# Patient Record
Sex: Male | Born: 1960
Health system: Southern US, Community
[De-identification: ages and names within clinical notes are randomized; demographics above are authoritative.]

## PROBLEM LIST (undated history)

## (undated) DIAGNOSIS — I10 Essential (primary) hypertension: Secondary | ICD-10-CM

## (undated) DIAGNOSIS — M1990 Unspecified osteoarthritis, unspecified site: Secondary | ICD-10-CM

## (undated) DIAGNOSIS — K219 Gastro-esophageal reflux disease without esophagitis: Secondary | ICD-10-CM

## (undated) DIAGNOSIS — E66811 Obesity, class 1: Secondary | ICD-10-CM

## (undated) DIAGNOSIS — R918 Other nonspecific abnormal finding of lung field: Secondary | ICD-10-CM

## (undated) DIAGNOSIS — E669 Obesity, unspecified: Secondary | ICD-10-CM

## (undated) HISTORY — PX: COLONOSCOPY: SHX174

## (undated) HISTORY — DX: Essential (primary) hypertension: I10

## (undated) HISTORY — DX: Obesity, unspecified: E66.9

## (undated) HISTORY — DX: Unspecified osteoarthritis, unspecified site: M19.90

## (undated) HISTORY — PX: OTHER SURGICAL HISTORY: SHX169

## (undated) HISTORY — DX: Other nonspecific abnormal finding of lung field: R91.8

## (undated) HISTORY — DX: Obesity, class 1: E66.811

## (undated) HISTORY — DX: Gastro-esophageal reflux disease without esophagitis: K21.9

---

## 2000-07-09 ENCOUNTER — Encounter: Payer: Self-pay | Admitting: Gastroenterology

## 2000-07-09 ENCOUNTER — Ambulatory Visit (HOSPITAL_COMMUNITY): Admission: RE | Admit: 2000-07-09 | Discharge: 2000-07-09 | Payer: Self-pay | Admitting: Gastroenterology

## 2014-12-06 DIAGNOSIS — K219 Gastro-esophageal reflux disease without esophagitis: Secondary | ICD-10-CM | POA: Insufficient documentation

## 2014-12-06 DIAGNOSIS — R7303 Prediabetes: Secondary | ICD-10-CM | POA: Insufficient documentation

## 2014-12-06 DIAGNOSIS — I10 Essential (primary) hypertension: Secondary | ICD-10-CM | POA: Insufficient documentation

## 2014-12-06 DIAGNOSIS — M1990 Unspecified osteoarthritis, unspecified site: Secondary | ICD-10-CM | POA: Insufficient documentation

## 2014-12-06 DIAGNOSIS — E785 Hyperlipidemia, unspecified: Secondary | ICD-10-CM | POA: Insufficient documentation

## 2014-12-27 DIAGNOSIS — M654 Radial styloid tenosynovitis [de Quervain]: Secondary | ICD-10-CM | POA: Insufficient documentation

## 2016-04-05 DIAGNOSIS — Z86010 Personal history of colonic polyps: Secondary | ICD-10-CM | POA: Insufficient documentation

## 2018-10-20 LAB — BASIC METABOLIC PANEL
BUN: 13 (ref 4–21)
Creatinine: 1 (ref 0.6–1.3)
Glucose: 99
Sodium: 140 (ref 137–147)

## 2019-09-29 ENCOUNTER — Telehealth: Payer: Self-pay | Admitting: Family Medicine

## 2019-09-29 NOTE — Telephone Encounter (Signed)
error 

## 2019-09-30 ENCOUNTER — Encounter: Payer: Self-pay | Admitting: Family Medicine

## 2019-09-30 ENCOUNTER — Ambulatory Visit (INDEPENDENT_AMBULATORY_CARE_PROVIDER_SITE_OTHER): Payer: BC Managed Care – PPO | Admitting: Family Medicine

## 2019-09-30 ENCOUNTER — Other Ambulatory Visit: Payer: Self-pay

## 2019-09-30 VITALS — BP 126/80 | HR 70 | Temp 98.6°F | Resp 16 | Ht 67.75 in | Wt 200.2 lb

## 2019-09-30 DIAGNOSIS — I10 Essential (primary) hypertension: Secondary | ICD-10-CM | POA: Diagnosis not present

## 2019-09-30 DIAGNOSIS — Z0000 Encounter for general adult medical examination without abnormal findings: Secondary | ICD-10-CM

## 2019-09-30 NOTE — Progress Notes (Signed)
Office Note 09/30/2019  CC:  Chief Complaint  Patient presents with  . Establish Care    Previous PCP, Dr. Caron Presume and seen at [facility] recently    HPI:  Todd Bailey is a 58 y.o. White male who is here to establish care and get CPE.  He is not fasting. Patient's most recent primary MD: see above Old records were not available for review prior to or during today's visit.  Building a home in [place]. Job super-stressful now, hoping to retire at age 3.  No formal exercise.   No home bp checking at all. Controlled at past MD visits while on losartan.  No acute complaints. Last CPE was approx 1.5-2 yrs ago.  Past Medical History:  Diagnosis Date  . Arthritis   . GERD (gastroesophageal reflux disease)   . Hypertension   . Obesity, Class I, BMI 30-34.9     Past Surgical History:  Procedure Laterality Date  . COLONOSCOPY  approx 2018   ? polyp, unclear on recall time--get records.  . ESOPHAGEAL DILATION  2001   Dr. Madilyn Fireman.  Had it done twice (GERD-induced stricture)   Review of Systems  Constitutional: Negative for appetite change, chills, fatigue and fever.  HENT: Negative for congestion, dental problem, ear pain and sore throat.   Eyes: Negative for discharge, redness and visual disturbance.  Respiratory: Negative for cough, chest tightness, shortness of breath and wheezing.   Cardiovascular: Negative for chest pain, palpitations and leg swelling.  Gastrointestinal: Negative for abdominal pain, blood in stool, diarrhea, nausea and vomiting.  Genitourinary: Negative for difficulty urinating, dysuria, flank pain, frequency, hematuria and urgency.  Musculoskeletal: Negative for arthralgias, back pain, joint swelling, myalgias and neck stiffness.  Skin: Negative for pallor and rash.  Neurological: Negative for dizziness, speech difficulty, weakness and headaches.  Hematological: Negative for adenopathy. Does not bruise/bleed easily.   Psychiatric/Behavioral: Negative for confusion and sleep disturbance. The patient is not nervous/anxious.     Family History  Problem Relation Age of Onset  . Arthritis Mother   . Hearing loss Mother   . Heart disease Mother   . Arthritis Father   . Heart disease Father   . High Cholesterol Father   . High blood pressure Father     Social History   Socioeconomic History  . Marital status: Married    Spouse name: Not on file  . Number of children: Not on file  . Years of education: Not on file  . Highest education level: Not on file  Occupational History  . Not on file  Social Needs  . Financial resource strain: Not on file  . Food insecurity    Worry: Not on file    Inability: Not on file  . Transportation needs    Medical: Not on file    Non-medical: Not on file  Tobacco Use  . Smoking status: Never Smoker  . Smokeless tobacco: Never Used  Substance and Sexual Activity  . Alcohol use: Never    Frequency: Never  . Drug use: Never  . Sexual activity: Not on file  Lifestyle  . Physical activity    Days per week: Not on file    Minutes per session: Not on file  . Stress: Not on file  Relationships  . Social Musician on phone: Not on file    Gets together: Not on file    Attends religious service: Not on file    Active member of club  or organization: Not on file    Attends meetings of clubs or organizations: Not on file    Relationship status: Not on file  . Intimate partner violence    Fear of current or ex partner: Not on file    Emotionally abused: Not on file    Physically abused: Not on file    Forced sexual activity: Not on file  Other Topics Concern  . Not on file  Social History Narrative   Married, several children (grown).   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   Tob: 25 pack-yr hx; quit 1997   Alc: rare beer   He leads teams on mission trips.       Outpatient Encounter Medications as of 09/30/2019  Medication Sig  . losartan (COZAAR) 50 MG tablet Take by mouth.  Marland Kitchen omeprazole (PRILOSEC) 20 MG capsule Take by mouth.   No facility-administered encounter medications on file as of 09/30/2019.     No Known Allergies  PE; Blood pressure 126/80, pulse 70, temperature 98.6 F (37 C), temperature source Temporal, resp. rate 16, height 5' 7.75" (1.721 m), weight 200 lb 3.2 oz (90.8 kg), SpO2 98 %. Body mass index is 30.67 kg/m.  Gen: Alert, well appearing.  Patient is oriented to person, place, time, and situation. AFFECT: pleasant, lucid thought and speech. ENT: Ears: EACs clear, normal epithelium.  TMs with good light reflex and landmarks bilaterally.  Eyes: no injection, icteris, swelling, or exudate.  EOMI, PERRLA. Nose: no drainage or turbinate edema/swelling.  No injection or focal lesion.  Mouth: lips without lesion/swelling.  Oral mucosa pink and moist.  Dentition intact and without obvious caries or gingival swelling.  Oropharynx without erythema, exudate, or swelling.  Neck: supple/nontender.  No LAD, mass, or TM.  Carotid pulses 2+ bilaterally, without bruits. CV: RRR, no m/r/g.   LUNGS: CTA bilat, nonlabored resps, good aeration in all lung fields. ABD: soft, NT, ND, BS normal.  No hepatospenomegaly or mass.  No bruits. EXT: no clubbing, cyanosis, or edema.  Musculoskeletal: no joint swelling, erythema, warmth, or tenderness.  ROM of all joints intact. Skin - no sores or suspicious lesions or rashes or color changes Rectal exam: negative without mass, lesions or tenderness, PROSTATE EXAM: smooth and symmetric without nodules or tenderness.   Pertinent labs:  None today  ASSESSMENT AND PLAN:   New pt; will get old records from PCP and GI if possible.  Health maintenance exam: Reviewed age and gender appropriate health maintenance issues (prudent diet, regular exercise, health risks of tobacco and excessive alcohol, use  of seatbelts, fire alarms in home, use of sunscreen).  Also reviewed age and gender appropriate health screening as well as vaccine recommendations. Vaccines: he declined flu, tetanus, and shingrix vaccines today. Labs: return for fasting HP + PSA. Prostate ca screening: DRE normal today, PSA-future. Colon ca screening: colonoscopy 2018 but need records for complete details.  An After Visit Summary was printed and given to the patient.  Return in about 1 year (around 09/29/2020) for annual CPE (fasting).  Signed:  Crissie Sickles, MD           09/30/2019

## 2019-09-30 NOTE — Patient Instructions (Signed)

## 2019-10-06 ENCOUNTER — Ambulatory Visit: Payer: Self-pay

## 2019-10-08 ENCOUNTER — Ambulatory Visit (INDEPENDENT_AMBULATORY_CARE_PROVIDER_SITE_OTHER): Payer: BC Managed Care – PPO

## 2019-10-08 ENCOUNTER — Other Ambulatory Visit: Payer: Self-pay

## 2019-10-08 DIAGNOSIS — I10 Essential (primary) hypertension: Secondary | ICD-10-CM

## 2019-10-08 LAB — TSH: TSH: 1.48 u[IU]/mL (ref 0.35–4.50)

## 2019-10-08 LAB — CBC WITH DIFFERENTIAL/PLATELET
Basophils Absolute: 0 10*3/uL (ref 0.0–0.1)
Basophils Relative: 0.5 % (ref 0.0–3.0)
Eosinophils Absolute: 0.2 10*3/uL (ref 0.0–0.7)
Eosinophils Relative: 3.4 % (ref 0.0–5.0)
HCT: 45.3 % (ref 39.0–52.0)
Hemoglobin: 15.5 g/dL (ref 13.0–17.0)
Lymphocytes Relative: 32.5 % (ref 12.0–46.0)
Lymphs Abs: 1.7 10*3/uL (ref 0.7–4.0)
MCHC: 34.3 g/dL (ref 30.0–36.0)
MCV: 88.6 fl (ref 78.0–100.0)
Monocytes Absolute: 0.3 10*3/uL (ref 0.1–1.0)
Monocytes Relative: 6.2 % (ref 3.0–12.0)
Neutro Abs: 3 10*3/uL (ref 1.4–7.7)
Neutrophils Relative %: 57.4 % (ref 43.0–77.0)
Platelets: 200 10*3/uL (ref 150.0–400.0)
RBC: 5.11 Mil/uL (ref 4.22–5.81)
RDW: 12.5 % (ref 11.5–15.5)
WBC: 5.2 10*3/uL (ref 4.0–10.5)

## 2019-10-08 LAB — COMPREHENSIVE METABOLIC PANEL
ALT: 20 U/L (ref 0–53)
AST: 15 U/L (ref 0–37)
Albumin: 4.2 g/dL (ref 3.5–5.2)
Alkaline Phosphatase: 103 U/L (ref 39–117)
BUN: 11 mg/dL (ref 6–23)
CO2: 29 mEq/L (ref 19–32)
Calcium: 9.5 mg/dL (ref 8.4–10.5)
Chloride: 104 mEq/L (ref 96–112)
Creatinine, Ser: 0.9 mg/dL (ref 0.40–1.50)
GFR: 86.09 mL/min (ref >60.00)
Glucose, Bld: 98 mg/dL (ref 70–99)
Potassium: 4.3 mEq/L (ref 3.5–5.1)
Sodium: 138 mEq/L (ref 135–145)
Total Bilirubin: 0.7 mg/dL (ref 0.2–1.2)
Total Protein: 6.8 g/dL (ref 6.0–8.3)

## 2019-10-08 LAB — LIPID PANEL
Cholesterol: 156 mg/dL (ref 0–200)
HDL: 32.8 mg/dL — ABNORMAL LOW (ref >39.00)
LDL Cholesterol: 103 mg/dL — ABNORMAL HIGH (ref 0–99)
NonHDL: 123.44
Total CHOL/HDL Ratio: 5
Triglycerides: 104 mg/dL (ref 0.0–149.0)
VLDL: 20.8 mg/dL (ref 0.0–40.0)

## 2019-10-09 ENCOUNTER — Other Ambulatory Visit: Payer: Self-pay

## 2019-10-09 MED ORDER — LOSARTAN POTASSIUM 50 MG PO TABS: 50.0000 mg | ORAL_TABLET | Freq: Every day | ORAL | 3 refills | Status: DC

## 2019-10-09 MED ORDER — OMEPRAZOLE 20 MG PO CPDR: 20.0000 mg | DELAYED_RELEASE_CAPSULE | Freq: Every day | ORAL | 3 refills | Status: DC

## 2019-10-09 NOTE — Telephone Encounter (Signed)
Pt recently established, labs were done 10/08/19 and all were normal. Patient is requesting refills for 2 meds not originally prescribed by you.   Please advise, thanks. Medications pending

## 2019-10-16 ENCOUNTER — Encounter: Payer: Self-pay | Admitting: Family Medicine

## 2020-03-31 ENCOUNTER — Encounter: Payer: Self-pay | Admitting: Family Medicine

## 2020-03-31 ENCOUNTER — Other Ambulatory Visit: Payer: Self-pay

## 2020-03-31 ENCOUNTER — Ambulatory Visit (INDEPENDENT_AMBULATORY_CARE_PROVIDER_SITE_OTHER): Payer: BC Managed Care – PPO | Admitting: Family Medicine

## 2020-03-31 VITALS — BP 129/90 | HR 87 | Temp 98.3°F | Resp 16 | Ht 67.75 in | Wt 207.4 lb

## 2020-03-31 DIAGNOSIS — F439 Reaction to severe stress, unspecified: Secondary | ICD-10-CM | POA: Diagnosis not present

## 2020-03-31 DIAGNOSIS — R002 Palpitations: Secondary | ICD-10-CM | POA: Diagnosis not present

## 2020-03-31 NOTE — Progress Notes (Signed)
OFFICE VISIT  04/04/2020   CC:  Chief Complaint  Patient presents with  . Episode of decreased cognitive function    feeling stressed for the past 3 weeks and in the last month, increased blood pressure. Flutters off and on   HPI:    Patient is a 59 y.o. Caucasian male who presents for "episode of decreased cognitive function 03/23/20, increased stress lately". Lots of increased work stress the last few months. Describes an abnormal period one day recently -->woke up, felt normal, took shower, made coffee, was looking for holster for his work phone, was not thinking as clearly as normal.  His wife was concerned.  He did not sleep well the night before.  Lots of sleep dysfunction lately due to work stress, waking up thinking about workload, hard to get back to sleep.   Chronically anxious, particularly about workload.  He has had a long hx of intermittent feeling of his heart fluttering up to the timehe got dx'd with HTN about 15 yrs ago.  Got on losartan and says bp controlled and fluttering stopped.    Says the fluttering episodes last minutes at a time, don't feel like a racing heart.  No SOB, dizziness, CP, diaphoresis, or cognitive dysfunction with the episodes.  They seem to come and go randomly and he just keeps on doing whatever he's doing.  Fluttering occurring daily now for the last 2 wks or so. Denies depression.  Normal behavior with coworkers and family and friends. No worries other than work situation.  Drinks 2 cups coffee each morning.  No other caffeine.  No changes in caffeine intake. On otc decongestants.  No herbals.  He does snore.  No witnessed apnea.  Sleep has usually been restorative. Works hard/well all day with good focus.  ROS: no fevers,  no wheezing, no cough,  no HAs, no rashes, no melena/hematochezia.  No polyuria or polydipsia.  No myalgias or arthralgias.  No focal weakness, paresthesias, or tremors.  No acute vision or hearing abnormalities.   No n/v/d or  abd pain.     Past Medical History:  Diagnosis Date  . Arthritis   . GERD (gastroesophageal reflux disease)   . Hypertension   . Obesity, Class I, BMI 30-34.9     Past Surgical History:  Procedure Laterality Date  . COLONOSCOPY  approx 2018   ? polyp, unclear on recall time--get records.  . ESOPHAGEAL DILATION  2001   Dr. Amedeo Plenty.  Had it done twice (GERD-induced stricture)    Outpatient Medications Prior to Visit  Medication Sig Dispense Refill  . losartan (COZAAR) 50 MG tablet Take 1 tablet (50 mg total) by mouth daily. 90 tablet 3  . omeprazole (PRILOSEC) 20 MG capsule Take 1 capsule (20 mg total) by mouth daily. 90 capsule 3   No facility-administered medications prior to visit.    No Known Allergies  ROS As per HPI  PE: Blood pressure 129/90, pulse 87, temperature 98.3 F (36.8 C), temperature source Temporal, resp. rate 16, height 5' 7.75" (1.721 m), weight 207 lb 6.4 oz (94.1 kg), SpO2 91 %. Body mass index is 31.77 kg/m.  Gen: Alert, well appearing.  Patient is oriented to person, place, time, and situation. AFFECT: pleasant, lucid thought and speech. ENT:  Eyes: no injection, icteris, swelling, or exudate.  EOMI, PERRLA. Nose: no drainage or turbinate edema/swelling.  No injection or focal lesion.  Mouth: lips without lesion/swelling.  Oral mucosa pink and moist.  Dentition intact and without obvious  caries or gingival swelling.  Oropharynx without erythema, exudate, or swelling.  Neck: supple/nontender.  No LAD, mass, or TM.  CV: RRR, no m/r/g.   LUNGS: CTA bilat, nonlabored resps, good aeration in all lung fields. ABD: soft, NT, ND, BS normal.  No hepatospenomegaly or mass.  No bruits. EXT: no clubbing, cyanosis, or edema.  Musculoskeletal: no joint swelling, erythema, warmth, or tenderness.  ROM of all joints intact. Skin - no sores or suspicious lesions or rashes or color changes  LABS:  Lab Results  Component Value Date   TSH 2.72 03/31/2020   Lab  Results  Component Value Date   WBC 6.4 03/31/2020   HGB 16.2 03/31/2020   HCT 47.7 03/31/2020   MCV 87.5 03/31/2020   PLT 246 03/31/2020   Lab Results  Component Value Date   CREATININE 0.91 03/31/2020   BUN 12 03/31/2020   NA 137 03/31/2020   K 4.4 03/31/2020   CL 104 03/31/2020   CO2 24 03/31/2020   Lab Results  Component Value Date   ALT 20 10/08/2019   AST 15 10/08/2019   ALKPHOS 103 10/08/2019   BILITOT 0.7 10/08/2019   Lab Results  Component Value Date   CHOL 156 10/08/2019   Lab Results  Component Value Date   HDL 32.80 (L) 10/08/2019   Lab Results  Component Value Date   LDLCALC 103 (H) 10/08/2019   Lab Results  Component Value Date   TRIG 104.0 10/08/2019   Lab Results  Component Value Date   CHOLHDL 5 10/08/2019   12 lead ekg today: NSR, rate 69, no ectopy.  No ST/T wave changes except isolated TWI in V1.  Low voltage aVF and V2.  intervals and duration normal. Essentially NORMAL EKG. No prior ekg for comparison.  IMPRESSION AND PLAN:  1) Palpitations: unknown etiology. EKG today reassuring. CBC, BMET, TSH, Mag today. Ordered 48 H holter monitor.  2) Situational stress: discussed possible med tx (SSRI if persistent/worsening, prn benzo possibly) but he is not wanting to pursue any med use at this time, which I wholeheartedly agree with.  An After Visit Summary was printed and given to the patient.  FOLLOW UP: Return if symptoms worsen or fail to improve.  Signed:  Santiago Bumpers, MD           04/04/2020

## 2020-04-01 ENCOUNTER — Telehealth: Payer: Self-pay | Admitting: Radiology

## 2020-04-01 LAB — BASIC METABOLIC PANEL
BUN: 12 mg/dL (ref 7–25)
CO2: 24 mmol/L (ref 20–32)
Calcium: 9.5 mg/dL (ref 8.6–10.3)
Chloride: 104 mmol/L (ref 98–110)
Creat: 0.91 mg/dL (ref 0.70–1.25)
Glucose, Bld: 92 mg/dL (ref 65–99)
Potassium: 4.4 mmol/L (ref 3.5–5.3)
Sodium: 137 mmol/L (ref 135–146)

## 2020-04-01 LAB — CBC
HCT: 47.7 % (ref 38.5–50.0)
Hemoglobin: 16.2 g/dL (ref 13.2–17.1)
MCH: 29.7 pg (ref 27.0–33.0)
MCHC: 34 g/dL (ref 32.0–36.0)
MCV: 87.5 fL (ref 80.0–100.0)
MPV: 10.5 fL (ref 7.5–12.5)
Platelets: 246 10*3/uL (ref 140–400)
RBC: 5.45 10*6/uL (ref 4.20–5.80)
RDW: 12.8 % (ref 11.0–15.0)
WBC: 6.4 10*3/uL (ref 3.8–10.8)

## 2020-04-01 LAB — MAGNESIUM: Magnesium: 2 mg/dL (ref 1.5–2.5)

## 2020-04-01 LAB — TSH: TSH: 2.72 mIU/L (ref 0.40–4.50)

## 2020-04-01 NOTE — Telephone Encounter (Signed)
Enrolled patient for a 3 day Zio monitor to be mailed to patients home. Brief instructions were gone over with the patient and he knows to expect the monitor to arrive in 3-4 days. Patient was informed per his ordering MD he only needs to wear for 2 days.   48hr holter was switch to a 3 day Zio for mailing purposes during Covid

## 2020-04-06 ENCOUNTER — Other Ambulatory Visit (INDEPENDENT_AMBULATORY_CARE_PROVIDER_SITE_OTHER): Payer: BC Managed Care – PPO

## 2020-04-06 DIAGNOSIS — R002 Palpitations: Secondary | ICD-10-CM | POA: Diagnosis not present

## 2020-04-30 DIAGNOSIS — R002 Palpitations: Secondary | ICD-10-CM

## 2020-04-30 HISTORY — DX: Palpitations: R00.2

## 2020-04-30 HISTORY — PX: OTHER SURGICAL HISTORY: SHX169

## 2020-05-03 ENCOUNTER — Encounter: Payer: Self-pay | Admitting: Family Medicine

## 2020-08-27 DIAGNOSIS — Z20822 Contact with and (suspected) exposure to covid-19: Secondary | ICD-10-CM | POA: Diagnosis not present

## 2020-08-31 DIAGNOSIS — E7800 Pure hypercholesterolemia, unspecified: Secondary | ICD-10-CM

## 2020-08-31 HISTORY — DX: Pure hypercholesterolemia, unspecified: E78.00

## 2020-09-13 DIAGNOSIS — Z20822 Contact with and (suspected) exposure to covid-19: Secondary | ICD-10-CM | POA: Diagnosis not present

## 2020-09-13 DIAGNOSIS — R5383 Other fatigue: Secondary | ICD-10-CM | POA: Diagnosis not present

## 2020-09-13 DIAGNOSIS — R519 Headache, unspecified: Secondary | ICD-10-CM | POA: Diagnosis not present

## 2020-09-30 ENCOUNTER — Encounter: Payer: Self-pay | Admitting: Family Medicine

## 2020-09-30 ENCOUNTER — Ambulatory Visit (INDEPENDENT_AMBULATORY_CARE_PROVIDER_SITE_OTHER): Payer: BC Managed Care – PPO | Admitting: Family Medicine

## 2020-09-30 ENCOUNTER — Other Ambulatory Visit: Payer: Self-pay

## 2020-09-30 VITALS — BP 155/95 | HR 70 | Temp 98.6°F | Resp 18 | Ht 68.0 in | Wt 202.2 lb

## 2020-09-30 DIAGNOSIS — Z125 Encounter for screening for malignant neoplasm of prostate: Secondary | ICD-10-CM

## 2020-09-30 DIAGNOSIS — I10 Essential (primary) hypertension: Secondary | ICD-10-CM | POA: Diagnosis not present

## 2020-09-30 DIAGNOSIS — Z0000 Encounter for general adult medical examination without abnormal findings: Secondary | ICD-10-CM | POA: Diagnosis not present

## 2020-09-30 LAB — COMPREHENSIVE METABOLIC PANEL
ALT: 25 U/L (ref 0–53)
AST: 18 U/L (ref 0–37)
Albumin: 4.2 g/dL (ref 3.5–5.2)
Alkaline Phosphatase: 97 U/L (ref 39–117)
BUN: 16 mg/dL (ref 6–23)
CO2: 27 mEq/L (ref 19–32)
Calcium: 9.4 mg/dL (ref 8.4–10.5)
Chloride: 103 mEq/L (ref 96–112)
Creatinine, Ser: 0.89 mg/dL (ref 0.40–1.50)
GFR: 86.92 mL/min (ref >60.00)
Glucose, Bld: 97 mg/dL (ref 70–99)
Potassium: 4.2 mEq/L (ref 3.5–5.1)
Sodium: 137 mEq/L (ref 135–145)
Total Bilirubin: 0.5 mg/dL (ref 0.2–1.2)
Total Protein: 6.9 g/dL (ref 6.0–8.3)

## 2020-09-30 LAB — CBC WITH DIFFERENTIAL/PLATELET
Basophils Absolute: 0 10*3/uL (ref 0.0–0.1)
Basophils Relative: 0.8 % (ref 0.0–3.0)
Eosinophils Absolute: 0.2 10*3/uL (ref 0.0–0.7)
Eosinophils Relative: 2.6 % (ref 0.0–5.0)
HCT: 43.7 % (ref 39.0–52.0)
Hemoglobin: 14.9 g/dL (ref 13.0–17.0)
Lymphocytes Relative: 28.8 % (ref 12.0–46.0)
Lymphs Abs: 1.7 10*3/uL (ref 0.7–4.0)
MCHC: 34.1 g/dL (ref 30.0–36.0)
MCV: 87.6 fl (ref 78.0–100.0)
Monocytes Absolute: 0.4 10*3/uL (ref 0.1–1.0)
Monocytes Relative: 6.5 % (ref 3.0–12.0)
Neutro Abs: 3.5 10*3/uL (ref 1.4–7.7)
Neutrophils Relative %: 61.3 % (ref 43.0–77.0)
Platelets: 212 10*3/uL (ref 150.0–400.0)
RBC: 4.99 Mil/uL (ref 4.22–5.81)
RDW: 12.7 % (ref 11.5–15.5)
WBC: 5.8 10*3/uL (ref 4.0–10.5)

## 2020-09-30 LAB — LIPID PANEL
Cholesterol: 176 mg/dL (ref 0–200)
HDL: 34.6 mg/dL — ABNORMAL LOW (ref >39.00)
LDL Cholesterol: 129 mg/dL — ABNORMAL HIGH (ref 0–99)
NonHDL: 141.23
Total CHOL/HDL Ratio: 5
Triglycerides: 62 mg/dL (ref 0.0–149.0)
VLDL: 12.4 mg/dL (ref 0.0–40.0)

## 2020-09-30 LAB — PSA: PSA: 0.86 ng/mL (ref 0.10–4.00)

## 2020-09-30 LAB — TSH: TSH: 1.78 u[IU]/mL (ref 0.35–4.50)

## 2020-09-30 MED ORDER — OMEPRAZOLE 20 MG PO CPDR
20.0000 mg | DELAYED_RELEASE_CAPSULE | Freq: Every day | ORAL | 3 refills | Status: DC
Start: 2020-09-30 — End: 2021-09-25

## 2020-09-30 MED ORDER — LOSARTAN POTASSIUM 100 MG PO TABS: 100.0000 mg | ORAL_TABLET | Freq: Every day | ORAL | 3 refills | Status: DC

## 2020-09-30 NOTE — Patient Instructions (Addendum)
Check your blood pressure and heart rate at home every day for two weeks. Write the numbers down and call our office (or send note via MyChart) to report these to my nurse and I'll get back with you about any new recommendations.  Health Maintenance, Male Adopting a healthy lifestyle and getting preventive care are important in promoting health and wellness. Ask your health care provider about:  The right schedule for you to have regular tests and exams.  Things you can do on your own to prevent diseases and keep yourself healthy. What should I know about diet, weight, and exercise? Eat a healthy diet   Eat a diet that includes plenty of vegetables, fruits, low-fat dairy products, and lean protein.  Do not eat a lot of foods that are high in solid fats, added sugars, or sodium. Maintain a healthy weight Body mass index (BMI) is a measurement that can be used to identify possible weight problems. It estimates body fat based on height and weight. Your health care provider can help determine your BMI and help you achieve or maintain a healthy weight. Get regular exercise Get regular exercise. This is one of the most important things you can do for your health. Most adults should:  Exercise for at least 150 minutes each week. The exercise should increase your heart rate and make you sweat (moderate-intensity exercise).  Do strengthening exercises at least twice a week. This is in addition to the moderate-intensity exercise.  Spend less time sitting. Even light physical activity can be beneficial. Watch cholesterol and blood lipids Have your blood tested for lipids and cholesterol at 61 years of age, then have this test every 5 years. You may need to have your cholesterol levels checked more often if:  Your lipid or cholesterol levels are high.  You are older than 61 years of age.  You are at high risk for heart disease. What should I know about cancer screening? Many types of cancers  can be detected early and may often be prevented. Depending on your health history and family history, you may need to have cancer screening at various ages. This may include screening for:  Colorectal cancer.  Prostate cancer.  Skin cancer.  Lung cancer. What should I know about heart disease, diabetes, and high blood pressure? Blood pressure and heart disease  High blood pressure causes heart disease and increases the risk of stroke. This is more likely to develop in people who have high blood pressure readings, are of African descent, or are overweight.  Talk with your health care provider about your target blood pressure readings.  Have your blood pressure checked: ? Every 3-5 years if you are 79-11 years of age. ? Every year if you are 57 years old or older.  If you are between the ages of 30 and 95 and are a current or former smoker, ask your health care provider if you should have a one-time screening for abdominal aortic aneurysm (AAA). Diabetes Have regular diabetes screenings. This checks your fasting blood sugar level. Have the screening done:  Once every three years after age 65 if you are at a normal weight and have a low risk for diabetes.  More often and at a younger age if you are overweight or have a high risk for diabetes. What should I know about preventing infection? Hepatitis B If you have a higher risk for hepatitis B, you should be screened for this virus. Talk with your health care provider to find  out if you are at risk for hepatitis B infection. Hepatitis C Blood testing is recommended for:  Everyone born from [birth year] through 1967.  Anyone with known risk factors for hepatitis C. Sexually transmitted infections (STIs)  You should be screened each year for STIs, including gonorrhea and chlamydia, if: ? You are sexually active and are younger than 61 years of age. ? You are older than 61 years of age and your health care provider tells you that you are at  risk for this type of infection. ? Your sexual activity has changed since you were last screened, and you are at increased risk for chlamydia or gonorrhea. Ask your health care provider if you are at risk.  Ask your health care provider about whether you are at high risk for HIV. Your health care provider may recommend a prescription medicine to help prevent HIV infection. If you choose to take medicine to prevent HIV, you should first get tested for HIV. You should then be tested every 3 months for as long as you are taking the medicine. Follow these instructions at home: Lifestyle  Do not use any products that contain nicotine or tobacco, such as cigarettes, e-cigarettes, and chewing tobacco. If you need help quitting, ask your health care provider.  Do not use street drugs.  Do not share needles.  Ask your health care provider for help if you need support or information about quitting drugs. Alcohol use  Do not drink alcohol if your health care provider tells you not to drink.  If you drink alcohol: ? Limit how much you have to 0-2 drinks a day. ? Be aware of how much alcohol is in your drink. In the [place] one drink equals one 12 oz bottle of beer (355 mL), one 5 oz glass of wine (148 mL), or one 1 oz glass of hard liquor (44 mL). General instructions  Schedule regular health, dental, and eye exams.  Stay current with your vaccines.  Tell your health care provider if: ? You often feel depressed. ? You have ever been abused or do not feel safe at home. Summary  Adopting a healthy lifestyle and getting preventive care are important in promoting health and wellness.  Follow your health care provider's instructions about healthy diet, exercising, and getting tested or screened for diseases.  Follow your health care provider's instructions on monitoring your cholesterol and blood pressure. This information is not intended to replace advice given to you by your health care  provider. Make sure you discuss any questions you have with your health care provider. Document Revised: 12/10/2018 Document Reviewed: 12/10/2018 Elsevier Patient Education  2020 [employer].

## 2020-09-30 NOTE — Progress Notes (Signed)
Office Note 09/30/2020  CC:  Chief Complaint  Patient presents with  . Annual Exam    doing well, no complaint   HPI:  Todd Bailey is a 59 y.o. White male who is here for annual health maintenance exam and f/u HTN.  Moved in house in [place] 10/2020. Work less stressful. Just got back from [place] mission trip.  Feeling well, no complaints. Rare bp check 140s/80s.  Exercise: rides a bike qod Tries to eat lower sodium diet.   Past Medical History:  Diagnosis Date  . Arthritis   . GERD (gastroesophageal reflux disease)   . Hypertension   . Obesity, Class I, BMI 30-34.9   . Palpitation 04/2020   Zio patch x 3d -->occ PAC and PVC; triggered events correlate with times of PVCs.  Reassured.    Past Surgical History:  Procedure Laterality Date  . COLONOSCOPY  approx 2018   ? polyp, unclear on recall time--get records.  . ESOPHAGEAL DILATION  2001   Dr. Madilyn Fireman.  Had it done twice (GERD-induced stricture)  . Heart Rhythm monitoring  04/2020   Zio patch x 3d -->occ PAC and PVC; triggered events correlate with times of PVCs.  Reassured.    Family History  Problem Relation Age of Onset  . Arthritis Mother   . Hearing loss Mother   . Heart disease Mother   . Arthritis Father   . Heart disease Father   . High Cholesterol Father   . High blood pressure Father     Social History   Socioeconomic History  . Marital status: Married    Spouse name: Not on file  . Number of children: Not on file  . Years of education: Not on file  . Highest education level: Not on file  Occupational History  . Not on file  Tobacco Use  . Smoking status: Never Smoker  . Smokeless tobacco: Never Used  Substance and Sexual Activity  . Alcohol use: Never  . Drug use: Never  . Sexual activity: Not on file  Other Topics Concern  . Not on file  Social History Narrative   Married, several children (grown).   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   Tob: 25 pack-yr hx; quit 1997   Alc: rare beer   He leads teams on mission trips.     Social Determinants of Health   Financial Resource Strain:   . Difficulty of Paying Living Expenses: Not on file  Food Insecurity:   . Worried About Programme researcher, broadcasting/film/video in the Last Year: Not on file  . Ran Out of Food in the Last Year: Not on file  Transportation Needs:   . Lack of Transportation (Medical): Not on file  . Lack of Transportation (Non-Medical): Not on file  Physical Activity:   . Days of Exercise per Week: Not on file  . Minutes of Exercise per Session: Not on file  Stress:   . Feeling of Stress : Not on file  Social Connections:   . Frequency of Communication with Friends and Family: Not on file  . Frequency of Social Gatherings with Friends and Family: Not on file  . Attends Religious Services: Not on file  . Active Member of Clubs or Organizations: Not on file  . Attends Banker Meetings: Not on file  . Marital Status: Not on file  Intimate Partner Violence:   . Fear of Current or Ex-Partner:  Not on file  . Emotionally Abused: Not on file  . Physically Abused: Not on file  . Sexually Abused: Not on file    Outpatient Medications Prior to Visit  Medication Sig Dispense Refill  . losartan (COZAAR) 50 MG tablet Take 1 tablet (50 mg total) by mouth daily. 90 tablet 3  . omeprazole (PRILOSEC) 20 MG capsule Take 1 capsule (20 mg total) by mouth daily. 90 capsule 3   No facility-administered medications prior to visit.    No Known Allergies  ROS Review of Systems  Constitutional: Negative for appetite change, chills, fatigue and fever.  HENT: Negative for congestion, dental problem, ear pain and sore throat.   Eyes: Negative for discharge, redness and visual disturbance.  Respiratory: Negative for cough, chest tightness, shortness of breath and wheezing.   Cardiovascular: Negative for chest pain,  palpitations and leg swelling.  Gastrointestinal: Negative for abdominal pain, blood in stool, diarrhea, nausea and vomiting.  Genitourinary: Negative for difficulty urinating, dysuria, flank pain, frequency, hematuria and urgency.  Musculoskeletal: Negative for arthralgias, back pain, joint swelling, myalgias and neck stiffness.  Skin: Negative for pallor and rash.  Neurological: Negative for dizziness, speech difficulty, weakness and headaches.  Hematological: Negative for adenopathy. Does not bruise/bleed easily.  Psychiatric/Behavioral: Negative for confusion and sleep disturbance. The patient is not nervous/anxious.     PE; Vitals with BMI 09/30/2020 03/31/2020 09/30/2019  Height 5\' 8"  5' 7.75" 5' 7.75"  Weight 202 lbs 3 oz 207 lbs 6 oz 200 lbs 3 oz  BMI 30.75 31.76 30.66  Systolic 155 129 09-26-2005  Diastolic 95 90 80  Pulse 70 87 70  O2 sat on RA today is 95% Repeat bp manual cuff at end of visit was 160/88  Gen: Alert, well appearing.  Patient is oriented to person, place, time, and situation. AFFECT: pleasant, lucid thought and speech. ENT: Ears: EACs clear, normal epithelium.  TMs with good light reflex and landmarks bilaterally.  Eyes: no injection, icteris, swelling, or exudate.  EOMI, PERRLA. Nose: no drainage or turbinate edema/swelling.  No injection or focal lesion.  Mouth: lips without lesion/swelling.  Oral mucosa pink and moist.  Dentition intact and without obvious caries or gingival swelling.  Oropharynx without erythema, exudate, or swelling.  Neck: supple/nontender.  No LAD, mass, or TM.  Carotid pulses 2+ bilaterally, without bruits. CV: RRR, no m/r/g.   LUNGS: CTA bilat, nonlabored resps, good aeration in all lung fields. ABD: soft, NT, ND, BS normal.  No hepatospenomegaly or mass.  No bruits. EXT: no clubbing, cyanosis, or edema.  Musculoskeletal: no joint swelling, erythema, warmth, or tenderness.  ROM of all joints intact. Skin - no sores or suspicious lesions or  rashes or color changes Rectal exam: negative without mass, lesions or tenderness, PROSTATE EXAM: smooth and symmetric without nodules or tenderness.   Pertinent labs:  Lab Results  Component Value Date   TSH 2.72 03/31/2020   Lab Results  Component Value Date   WBC 6.4 03/31/2020   HGB 16.2 03/31/2020   HCT 47.7 03/31/2020   MCV 87.5 03/31/2020   PLT 246 03/31/2020   Lab Results  Component Value Date   CREATININE 0.91 03/31/2020   BUN 12 03/31/2020   NA 137 03/31/2020   K 4.4 03/31/2020   CL 104 03/31/2020   CO2 24 03/31/2020   Lab Results  Component Value Date   ALT 20 10/08/2019   AST 15 10/08/2019   ALKPHOS 103 10/08/2019   BILITOT 0.7 10/08/2019  Lab Results  Component Value Date   CHOL 156 10/08/2019   Lab Results  Component Value Date   HDL 32.80 (L) 10/08/2019   Lab Results  Component Value Date   LDLCALC 103 (H) 10/08/2019   Lab Results  Component Value Date   TRIG 104.0 10/08/2019   Lab Results  Component Value Date   CHOLHDL 5 10/08/2019    ASSESSMENT AND PLAN:   1) Uncontrolled HTN: increase losartan to 100 mg qd. He prefers monitoring and then report to me rather than set up a f/u appt for this. Instructions: Check your blood pressure and heart rate at home every day for two weeks. Write the numbers down and call our office (or send note via MyChart) to report these to my nurse and I'll get back with you about any new recommendations.  2) Health maintenance exam: Reviewed age and gender appropriate health maintenance issues (prudent diet, regular exercise, health risks of tobacco and excessive alcohol, use of seatbelts, fire alarms in home, use of sunscreen).  Also reviewed age and gender appropriate health screening as well as vaccine recommendations. Vaccines: Tdap->declines.  Flu->declines.  Shingrix->declines.  Covid 19->declines. Labs: fasting HP, PSA. Prostate ca screening: DRE normal today, PSA. Colon ca screening: hx polyps  approx 2018, recall 2023. I've requested records of his last colonoscopy Deboraha Sprang) again today.  An After Visit Summary was printed and given to the patient.  FOLLOW UP:  Return in about 1 year (around 09/30/2021) for annual CPE (fasting).  Signed:  Santiago Bumpers, MD           09/30/2020

## 2020-10-03 ENCOUNTER — Encounter: Payer: Self-pay | Admitting: Family Medicine

## 2020-10-12 ENCOUNTER — Encounter: Payer: BC Managed Care – PPO | Admitting: Family Medicine

## 2020-11-05 ENCOUNTER — Encounter: Payer: Self-pay | Admitting: Family Medicine

## 2020-11-07 NOTE — Telephone Encounter (Signed)
Please advise on message below. He is currently taking Losartan 100mg  qd.

## 2021-01-09 ENCOUNTER — Telehealth: Payer: Self-pay | Admitting: Family Medicine

## 2021-01-09 DIAGNOSIS — E7800 Pure hypercholesterolemia, unspecified: Secondary | ICD-10-CM

## 2021-01-09 NOTE — Telephone Encounter (Signed)
Lab placed

## 2021-01-09 NOTE — Telephone Encounter (Signed)
Per Dr. Samul Dada note on lab results, patient needs to return to recheck FLP - lab visit only. Please enter orders so that we can schedule patient for visit.

## 2021-01-10 ENCOUNTER — Ambulatory Visit: Payer: BC Managed Care – PPO | Admitting: Family Medicine

## 2021-01-10 NOTE — Telephone Encounter (Signed)
Left voicemail for patient to call the office for scheduling.

## 2021-01-10 NOTE — Telephone Encounter (Signed)
Lab appt scheduled for 01/13/21

## 2021-01-13 ENCOUNTER — Ambulatory Visit (INDEPENDENT_AMBULATORY_CARE_PROVIDER_SITE_OTHER): Payer: BC Managed Care – PPO

## 2021-01-13 ENCOUNTER — Other Ambulatory Visit: Payer: Self-pay

## 2021-01-13 DIAGNOSIS — E7800 Pure hypercholesterolemia, unspecified: Secondary | ICD-10-CM

## 2021-01-13 LAB — LIPID PANEL
Cholesterol: 158 mg/dL (ref 0–200)
HDL: 33.4 mg/dL — ABNORMAL LOW (ref >39.00)
LDL Cholesterol: 109 mg/dL — ABNORMAL HIGH (ref 0–99)
NonHDL: 124.29
Total CHOL/HDL Ratio: 5
Triglycerides: 76 mg/dL (ref 0.0–149.0)
VLDL: 15.2 mg/dL (ref 0.0–40.0)

## 2021-01-16 ENCOUNTER — Encounter: Payer: Self-pay | Admitting: Family Medicine

## 2021-01-17 ENCOUNTER — Telehealth: Payer: Self-pay

## 2021-01-17 NOTE — Telephone Encounter (Signed)
Returning Todd Bailey's call about his test results.  Patient said he did not need to speak anyone.  He seen where Dr. Milinda Cave said about his cholesterol being down and keep up the great work!   Patient does not need to be called back.  He didn't answer the phone because it said "private".

## 2021-03-31 ENCOUNTER — Encounter: Payer: Self-pay | Admitting: Family Medicine

## 2021-03-31 ENCOUNTER — Other Ambulatory Visit: Payer: Self-pay

## 2021-03-31 ENCOUNTER — Ambulatory Visit (INDEPENDENT_AMBULATORY_CARE_PROVIDER_SITE_OTHER): Payer: BC Managed Care – PPO | Admitting: Family Medicine

## 2021-03-31 VITALS — BP 126/77 | HR 64 | Temp 97.7°F | Resp 16 | Ht 68.0 in | Wt 204.0 lb

## 2021-03-31 DIAGNOSIS — R21 Rash and other nonspecific skin eruption: Secondary | ICD-10-CM

## 2021-03-31 MED ORDER — FLUTICASONE PROPIONATE 0.05 % EX CREA: TOPICAL_CREAM | Freq: Two times a day (BID) | CUTANEOUS | 1 refills | Status: DC

## 2021-03-31 MED ORDER — DOXYCYCLINE HYCLATE 100 MG PO CAPS: 100.0000 mg | ORAL_CAPSULE | Freq: Two times a day (BID) | ORAL | 0 refills | Status: AC

## 2021-03-31 NOTE — Progress Notes (Signed)
OFFICE VISIT  03/31/2021  CC:  Chief Complaint  Patient presents with  . Spider bite    2-3 weeks, not going away and getting bigger. One located on mid abdomen and one on chest   HPI:    Patient is a 59 y.o. Caucasian male who presents for rash. Noted itchy spot on upper abd a few weeks ago, a little redness started to spread out from the spot gradually.  No central clearing.  No recent tick bite.  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 No pain, no fever, no nodule, no "head".  Similar area appeared a little later in L pectoral region--itchy. He applied otc hydrocortisone a couple of times.  No other meds tried. He leaves for [facility] tomorrow--will be seeing [facility], [place], etc.  Feeling well.   Past Medical History:  Diagnosis Date  . Arthritis   . GERD (gastroesophageal reflux disease)   . Hypercholesterolemia 08/2020   TLC->imp 12/2020  . Hypertension   . Obesity, Class I, BMI 30-34.9   . Palpitation 04/2020   Zio patch x 3d -->occ PAC and PVC; triggered events correlate with times of PVCs.  Reassured.    Past Surgical History:  Procedure Laterality Date  . COLONOSCOPY  approx 2018   ? polyp, unclear on recall time--get records.  . ESOPHAGEAL DILATION  2001   Dr. Madilyn Fireman.  Had it done twice (GERD-induced stricture)  . Heart Rhythm monitoring  04/2020   Zio patch x 3d -->occ PAC and PVC; triggered events correlate with times of PVCs.  Reassured.    Outpatient Medications Prior to Visit  Medication Sig Dispense Refill  . Cholecalciferol (VITAMIN D3 PO) Take by mouth daily.    Marland Kitchen losartan (COZAAR) 100 MG tablet Take 1 tablet (100 mg total) by mouth daily. 90 tablet 3  . Multiple Vitamin (MULTIVITAMIN ADULT PO) Take by mouth daily.    . Multiple Vitamins-Minerals (ZINC PO) Take by mouth daily.    . Omega-3 Fatty Acids  (FISH OIL PO) Take by mouth daily.    Marland Kitchen omeprazole (PRILOSEC) 20 MG capsule Take 1 capsule (20 mg total) by mouth daily. 90 capsule 3   No facility-administered medications prior to visit.    No Known Allergies  ROS As per HPI  PE: Vitals with BMI 03/31/2021 09/30/2020 03/31/2020  Height 5\' 8"  5\' 8"  5' 7.75"  Weight 204 lbs 202 lbs 3 oz 207 lbs 6 oz  BMI 31.03 30.75 31.76  Systolic 126 155  Diastolic 77 95 90  Pulse 64 70 87  Gen: Alert, well appearing.  Patient is oriented to person, place, time, and situation. AFFECT: pleasant, lucid thought and speech. Upper abd just to left of midline there is dry punctate papule within a fist-sized region of light pinkish fine papular rash that is minimally blanchable, question of slight central clearing.  Borders not well defined.   Similar appearing area of rash in L pectoral region. No vesicles or pustules or nodules.  No streaking or warmth.  LABS:    Chemistry      Component Value Date/Time   NA 137 09/30/2020 0855   NA 140 10/20/2018 0000   K 4.2 09/30/2020 0855   CL 103 09/30/2020 0855   CO2 27 09/30/2020 0855   BUN 16 09/30/2020 0855   BUN 13 10/20/2018 0000  CREATININE 0.89 09/30/2020 0855   CREATININE 0.91 03/31/2020 1605   GLU 99 10/20/2018 0000      Component Value Date/Time   CALCIUM 9.4 09/30/2020 0855   ALKPHOS 97 09/30/2020 0855   AST 18 09/30/2020 0855   ALT 25 09/30/2020 0855   BILITOT 0.5 09/30/2020 0855     Lab Results  Component Value Date   CHOL 158 01/13/2021   HDL 33.40 (L) 01/13/2021   LDLCALC 109 (H) 01/13/2021   TRIG 76.0 01/13/2021   CHOLHDL 5 01/13/2021    IMPRESSION AND PLAN:  Rash, unknown etiology.  Suspect it may simply be dermatitis rxn to an insect bite or irritation/sensitivity to hay. Trial of cutivate 0.05% cream bid. I am also suspicious of this possibly being erythema migrans/early lyme dz and will treat with doxy 100mg  bid x10d.  Signs/symptoms to call or return for were  reviewed and pt expressed understanding.  An After Visit Summary was printed and given to the patient.  FOLLOW UP: Return if symptoms worsen or fail to improve.  Signed:  , MD           03/31/2021

## 2021-08-21 ENCOUNTER — Other Ambulatory Visit: Payer: Self-pay | Admitting: Family Medicine

## 2021-09-12 ENCOUNTER — Inpatient Hospital Stay (HOSPITAL_BASED_OUTPATIENT_CLINIC_OR_DEPARTMENT_OTHER)
Admission: EM | Admit: 2021-09-12 | Discharge: 2021-09-14 | DRG: 419 | Disposition: A | Discharge status: Home / Self Care | Payer: BC Managed Care – PPO | Attending: General Surgery | Admitting: General Surgery

## 2021-09-12 ENCOUNTER — Encounter (HOSPITAL_BASED_OUTPATIENT_CLINIC_OR_DEPARTMENT_OTHER): Payer: Self-pay

## 2021-09-12 ENCOUNTER — Other Ambulatory Visit: Payer: Self-pay

## 2021-09-12 ENCOUNTER — Emergency Department (HOSPITAL_BASED_OUTPATIENT_CLINIC_OR_DEPARTMENT_OTHER): Payer: BC Managed Care – PPO

## 2021-09-12 DIAGNOSIS — Z87891 Personal history of nicotine dependence: Secondary | ICD-10-CM | POA: Diagnosis not present

## 2021-09-12 DIAGNOSIS — K8012 Calculus of gallbladder with acute and chronic cholecystitis without obstruction: Secondary | ICD-10-CM | POA: Diagnosis not present

## 2021-09-12 DIAGNOSIS — Z86010 Personal history of colonic polyps: Secondary | ICD-10-CM | POA: Diagnosis not present

## 2021-09-12 DIAGNOSIS — R1013 Epigastric pain: Secondary | ICD-10-CM | POA: Diagnosis not present

## 2021-09-12 DIAGNOSIS — R1011 Right upper quadrant pain: Secondary | ICD-10-CM | POA: Diagnosis not present

## 2021-09-12 DIAGNOSIS — Z8261 Family history of arthritis: Secondary | ICD-10-CM | POA: Diagnosis not present

## 2021-09-12 DIAGNOSIS — K8000 Calculus of gallbladder with acute cholecystitis without obstruction: Secondary | ICD-10-CM | POA: Diagnosis not present

## 2021-09-12 DIAGNOSIS — E785 Hyperlipidemia, unspecified: Secondary | ICD-10-CM | POA: Diagnosis not present

## 2021-09-12 DIAGNOSIS — E860 Dehydration: Secondary | ICD-10-CM | POA: Diagnosis present

## 2021-09-12 DIAGNOSIS — R109 Unspecified abdominal pain: Secondary | ICD-10-CM

## 2021-09-12 DIAGNOSIS — K8020 Calculus of gallbladder without cholecystitis without obstruction: Secondary | ICD-10-CM | POA: Diagnosis not present

## 2021-09-12 DIAGNOSIS — Z83438 Family history of other disorder of lipoprotein metabolism and other lipidemia: Secondary | ICD-10-CM | POA: Diagnosis not present

## 2021-09-12 DIAGNOSIS — Z8249 Family history of ischemic heart disease and other diseases of the circulatory system: Secondary | ICD-10-CM | POA: Diagnosis not present

## 2021-09-12 DIAGNOSIS — Z79899 Other long term (current) drug therapy: Secondary | ICD-10-CM

## 2021-09-12 DIAGNOSIS — K219 Gastro-esophageal reflux disease without esophagitis: Secondary | ICD-10-CM | POA: Diagnosis not present

## 2021-09-12 DIAGNOSIS — Z20822 Contact with and (suspected) exposure to covid-19: Secondary | ICD-10-CM | POA: Diagnosis not present

## 2021-09-12 DIAGNOSIS — E7800 Pure hypercholesterolemia, unspecified: Secondary | ICD-10-CM | POA: Diagnosis present

## 2021-09-12 DIAGNOSIS — Z9889 Other specified postprocedural states: Secondary | ICD-10-CM | POA: Diagnosis not present

## 2021-09-12 DIAGNOSIS — K819 Cholecystitis, unspecified: Secondary | ICD-10-CM

## 2021-09-12 DIAGNOSIS — M1990 Unspecified osteoarthritis, unspecified site: Secondary | ICD-10-CM | POA: Diagnosis not present

## 2021-09-12 DIAGNOSIS — K5790 Diverticulosis of intestine, part unspecified, without perforation or abscess without bleeding: Secondary | ICD-10-CM | POA: Diagnosis not present

## 2021-09-12 DIAGNOSIS — I10 Essential (primary) hypertension: Secondary | ICD-10-CM | POA: Diagnosis not present

## 2021-09-12 DIAGNOSIS — K8066 Calculus of gallbladder and bile duct with acute and chronic cholecystitis without obstruction: Secondary | ICD-10-CM | POA: Diagnosis not present

## 2021-09-12 LAB — RESP PANEL BY RT-PCR (FLU A&B, COVID) ARPGX2
Influenza A by PCR: NEGATIVE
Influenza B by PCR: NEGATIVE
SARS Coronavirus 2 by RT PCR: NEGATIVE

## 2021-09-12 LAB — COMPREHENSIVE METABOLIC PANEL
ALT: 533 U/L — ABNORMAL HIGH (ref 0–44)
AST: 500 U/L — ABNORMAL HIGH (ref 15–41)
Albumin: 4.4 g/dL (ref 3.5–5.0)
Alkaline Phosphatase: 115 U/L (ref 38–126)
Anion gap: 9 (ref 5–15)
BUN: 17 mg/dL (ref 8–23)
CO2: 25 mmol/L (ref 22–32)
Calcium: 9.6 mg/dL (ref 8.9–10.3)
Chloride: 102 mmol/L (ref 98–111)
Creatinine, Ser: 0.96 mg/dL (ref 0.61–1.24)
GFR, Estimated: >60 mL/min (ref >60)
Glucose, Bld: 145 mg/dL — ABNORMAL HIGH (ref 70–99)
Potassium: 4.2 mmol/L (ref 3.5–5.1)
Sodium: 136 mmol/L (ref 135–145)
Total Bilirubin: 1.8 mg/dL — ABNORMAL HIGH (ref 0.3–1.2)
Total Protein: 7.6 g/dL (ref 6.5–8.1)

## 2021-09-12 LAB — URINALYSIS, ROUTINE W REFLEX MICROSCOPIC
Bilirubin Urine: NEGATIVE
Glucose, UA: NEGATIVE mg/dL
Hgb urine dipstick: NEGATIVE
Ketones, ur: NEGATIVE mg/dL
Leukocytes,Ua: NEGATIVE
Nitrite: NEGATIVE
Protein, ur: NEGATIVE mg/dL
Specific Gravity, Urine: 1.015 (ref 1.005–1.030)
pH: 8 (ref 5.0–8.0)

## 2021-09-12 LAB — CBC
HCT: 46.4 % (ref 39.0–52.0)
HCT: 41.8 % (ref 39.0–52.0)
Hemoglobin: 16.3 g/dL (ref 13.0–17.0)
Hemoglobin: 14.8 g/dL (ref 13.0–17.0)
MCH: 30.6 pg (ref 26.0–34.0)
MCH: 30.4 pg (ref 26.0–34.0)
MCHC: 35.1 g/dL (ref 30.0–36.0)
MCHC: 35.4 g/dL (ref 30.0–36.0)
MCV: 87.1 fL (ref 80.0–100.0)
MCV: 85.8 fL (ref 80.0–100.0)
Platelets: 199 10*3/uL (ref 150–400)
Platelets: 196 10*3/uL (ref 150–400)
RBC: 5.33 MIL/uL (ref 4.22–5.81)
RBC: 4.87 MIL/uL (ref 4.22–5.81)
RDW: 12.1 % (ref 11.5–15.5)
RDW: 12.3 % (ref 11.5–15.5)
WBC: 15.4 10*3/uL — ABNORMAL HIGH (ref 4.0–10.5)
WBC: 12.3 10*3/uL — ABNORMAL HIGH (ref 4.0–10.5)
nRBC: 0 % (ref 0.0–0.2)
nRBC: 0 % (ref 0.0–0.2)

## 2021-09-12 LAB — CREATININE, SERUM
Creatinine, Ser: 0.84 mg/dL (ref 0.61–1.24)
GFR, Estimated: >60 mL/min (ref >60)

## 2021-09-12 LAB — LIPASE, BLOOD: Lipase: 35 U/L (ref 11–51)

## 2021-09-12 MED ORDER — IBUPROFEN 800 MG PO TABS
800.0000 mg | ORAL_TABLET | Freq: Once | ORAL | Status: AC
  Administered 2021-09-12: 800 mg via ORAL
  Filled 2021-09-12: qty 1

## 2021-09-12 MED ORDER — SIMETHICONE 80 MG PO CHEW: 40.0000 mg | CHEWABLE_TABLET | Freq: Four times a day (QID) | ORAL | Status: DC | PRN

## 2021-09-12 MED ORDER — LACTATED RINGERS IV SOLN: INTRAVENOUS | Status: DC

## 2021-09-12 MED ORDER — ONDANSETRON HCL 4 MG/2ML IJ SOLN: 4.0000 mg | Freq: Four times a day (QID) | INTRAMUSCULAR | Status: DC | PRN

## 2021-09-12 MED ORDER — DOCUSATE SODIUM 100 MG PO CAPS
100.0000 mg | ORAL_CAPSULE | Freq: Two times a day (BID) | ORAL | Status: DC
  Administered 2021-09-12 – 2021-09-13 (×3): 100 mg via ORAL
  Filled 2021-09-12 (×3): qty 1

## 2021-09-12 MED ORDER — OXYCODONE HCL 5 MG PO TABS
5.0000 mg | ORAL_TABLET | ORAL | Status: DC | PRN
  Administered 2021-09-14: 5 mg via ORAL
  Filled 2021-09-12: qty 1

## 2021-09-12 MED ORDER — OXYCODONE HCL 5 MG PO TABS: 10.0000 mg | ORAL_TABLET | ORAL | Status: DC | PRN

## 2021-09-12 MED ORDER — SODIUM CHLORIDE 0.9 % IV BOLUS
500.0000 mL | Freq: Once | INTRAVENOUS | Status: AC
  Administered 2021-09-12: 500 mL via INTRAVENOUS

## 2021-09-12 MED ORDER — ENOXAPARIN SODIUM 40 MG/0.4ML IJ SOSY
40.0000 mg | PREFILLED_SYRINGE | INTRAMUSCULAR | Status: DC
  Administered 2021-09-13: 40 mg via SUBCUTANEOUS
  Filled 2021-09-12: qty 0.4

## 2021-09-12 MED ORDER — PANTOPRAZOLE SODIUM 40 MG PO TBEC
40.0000 mg | DELAYED_RELEASE_TABLET | Freq: Every day | ORAL | Status: DC
Start: 2021-09-12 — End: 2021-09-14
  Administered 2021-09-12 – 2021-09-13 (×2): 40 mg via ORAL
  Filled 2021-09-12 (×2): qty 1

## 2021-09-12 MED ORDER — HYDROMORPHONE HCL 1 MG/ML IJ SOLN: 0.5000 mg | INTRAMUSCULAR | Status: DC | PRN

## 2021-09-12 MED ORDER — PROCHLORPERAZINE EDISYLATE 10 MG/2ML IJ SOLN: 10.0000 mg | INTRAMUSCULAR | Status: DC | PRN

## 2021-09-12 MED ORDER — METHOCARBAMOL 1000 MG/10ML IJ SOLN
500.0000 mg | Freq: Four times a day (QID) | INTRAVENOUS | Status: DC | PRN
  Filled 2021-09-12: qty 5

## 2021-09-12 MED ORDER — LOSARTAN POTASSIUM 50 MG PO TABS
100.0000 mg | ORAL_TABLET | Freq: Every day | ORAL | Status: DC
  Administered 2021-09-12 – 2021-09-13 (×2): 100 mg via ORAL
  Filled 2021-09-12 (×2): qty 2

## 2021-09-12 MED ORDER — ACETAMINOPHEN 325 MG PO TABS
650.0000 mg | ORAL_TABLET | Freq: Four times a day (QID) | ORAL | Status: DC
  Administered 2021-09-12 – 2021-09-13 (×3): 650 mg via ORAL
  Filled 2021-09-12 (×4): qty 2

## 2021-09-12 NOTE — ED Provider Notes (Signed)
Pt seen in conjunction with L Autry, PA-C. Please see her notes for full history, exam, and plan.  In brief, patient presenting for evaluation of upper abdominal pain, nausea, vomiting, started yesterday.  On exam, patient is very well-appearing, although mildly tachycardic.  Pain has mostly improved.  Labs obtained in triage show elevated LFTs and bili.  Concern for gallbladder etiology. US shows stones. Discussed with surgery, pt to be admitted to their service.    Alveria Apley, PA-C 09/12/21 1854    Pollyann Savoy, MD 09/12/21 [phone]

## 2021-09-12 NOTE — H&P (Signed)
Admitting Physician: Nickola Major Kentavious Michele  Service: General Surgery  CC: Abdominal pain  Subjective   HPI: Todd Bailey is an 60 y.o. male who is here for abdominal pain.  He had severe abdominal pain starting Monday night after eating a dinner of ribs.  The pain was located in the epigastric and right upper quadrant area.  The pain has improved much since admission to the hospital, but he still has pain with deep palpation and a positive Murphy sign on exam.  No abdominal surgeries before.  Past Medical History:  Diagnosis Date   Arthritis    GERD (gastroesophageal reflux disease)    Hypercholesterolemia 08/2020   TLC->imp 12/2020   Hypertension    Obesity, Class I, BMI 30-34.9    Palpitation 04/2020   Zio patch x 3d -->occ PAC and PVC; triggered events correlate with times of PVCs.  Reassured.    Past Surgical History:  Procedure Laterality Date   COLONOSCOPY  approx 2018   ? polyp, unclear on recall time--get records.   ESOPHAGEAL DILATION  2001   Dr. Amedeo Plenty.  Had it done twice (GERD-induced stricture)   Heart Rhythm monitoring  04/2020   Zio patch x 3d -->occ PAC and PVC; triggered events correlate with times of PVCs.  Reassured.    Family History  Problem Relation Age of Onset   Arthritis Mother    Hearing loss Mother    Heart disease Mother    Arthritis Father    Heart disease Father    High Cholesterol Father    High blood pressure Father     Social:  reports that he has quit smoking. His smoking use included cigarettes. He has never used smokeless tobacco. He reports that he does not drink alcohol and does not use drugs.  Allergies: No Known Allergies  Medications: Current Outpatient Medications  Medication Instructions   Cholecalciferol (VITAMIN D3 PO) Oral, Daily   fluticasone (CUTIVATE) 0.05 % cream Topical, 2 times daily   losartan (COZAAR) 100 mg, Oral, Daily   Multiple Vitamin (MULTIVITAMIN ADULT PO) Oral, Daily   Multiple  Vitamins-Minerals (ZINC PO) Oral, Daily   Omega-3 Fatty Acids (FISH OIL PO) Oral, Daily   omeprazole (PRILOSEC) 20 mg, Oral, Daily    ROS - all of the below systems have been reviewed with the patient and positives are indicated with bold text General: chills, fever or night sweats Eyes: blurry vision or double vision ENT: epistaxis or sore throat Allergy/Immunology: itchy/watery eyes or nasal congestion Hematologic/Lymphatic: bleeding problems, blood clots or swollen lymph nodes Endocrine: temperature intolerance or unexpected weight changes Breast: new or changing breast lumps or nipple discharge Resp: cough, shortness of breath, or wheezing CV: chest pain or dyspnea on exertion GI: as per HPI GU: dysuria, trouble voiding, or hematuria MSK: joint pain or joint stiffness Neuro: TIA or stroke symptoms Derm: pruritus and skin lesion changes Psych: anxiety and depression  Objective   PE Blood pressure (!) 145/86, pulse (!) 113, temperature (S) 99.9 F (37.7 C), temperature source Oral, resp. rate 18, height $RemoveBe'5\' 8"'[id number]$  (1.727 m), weight 92.1 kg, SpO2 95 %. Constitutional: NAD; conversant; no deformities Eyes: Moist conjunctiva; no lid lag; anicteric; PERRL Neck: Trachea midline; no thyromegaly Lungs: Normal respiratory effort; no tactile fremitus CV: RRR; no palpable thrills; no pitting edema GI: Abd Positive Murphy sign, RUQ tenderness to deep palpation; no palpable hepatosplenomegaly MSK: Normal range of motion of extremities; no clubbing/cyanosis Psychiatric: Appropriate affect; alert and oriented x3 Lymphatic: No palpable  cervical or axillary lymphadenopathy  Results for orders placed or performed during the hospital encounter of 09/12/21 (from the past 24 hour(s))  Lipase, blood     Status: None   Collection Time: 09/12/21 12:13 PM  Result Value Ref Range   Lipase 35 11 - 51 U/L  Comprehensive metabolic panel     Status: Abnormal   Collection Time: 09/12/21 12:13 PM  Result  Value Ref Range   Sodium 136 135 - 145 mmol/L   Potassium 4.2 3.5 - 5.1 mmol/L   Chloride 102 98 - 111 mmol/L   CO2 25 22 - 32 mmol/L   Glucose, Bld 145 (H) 70 - 99 mg/dL   BUN 17 8 - 23 mg/dL   Creatinine, Ser 0.96 0.61 - 1.24 mg/dL   Calcium 9.6 8.9 - 10.3 mg/dL   Total Protein 7.6 6.5 - 8.1 g/dL   Albumin 4.4 3.5 - 5.0 g/dL   AST 500 (H) 15 - 41 U/L   ALT 533 (H) 0 - 44 U/L   Alkaline Phosphatase 115 38 - 126 U/L   Total Bilirubin 1.8 (H) 0.3 - 1.2 mg/dL   GFR, Estimated >60 >60 mL/min   Anion gap 9 5 - 15  CBC     Status: Abnormal   Collection Time: 09/12/21 12:13 PM  Result Value Ref Range   WBC 15.4 (H) 4.0 - 10.5 K/uL   RBC 5.33 4.22 - 5.81 MIL/uL   Hemoglobin 16.3 13.0 - 17.0 g/dL   HCT 46.4 39.0 - 52.0 %   MCV 87.1 80.0 - 100.0 fL   MCH 30.6 26.0 - 34.0 pg   MCHC 35.1 30.0 - 36.0 g/dL   RDW 12.1 11.5 - 15.5 %   Platelets 199 150 - 400 K/uL   nRBC 0.0 0.0 - 0.2 %  Urinalysis, Routine w reflex microscopic     Status: Abnormal   Collection Time: 09/12/21 12:32 PM  Result Value Ref Range   Color, Urine AMBER (A) YELLOW   APPearance CLEAR CLEAR   Specific Gravity, Urine 1.015 1.005 - 1.030   pH 8.0 5.0 - 8.0   Glucose, UA NEGATIVE NEGATIVE mg/dL   Hgb urine dipstick NEGATIVE NEGATIVE   Bilirubin Urine NEGATIVE NEGATIVE   Ketones, ur NEGATIVE NEGATIVE mg/dL   Protein, ur NEGATIVE NEGATIVE mg/dL   Nitrite NEGATIVE NEGATIVE   Leukocytes,Ua NEGATIVE NEGATIVE  Resp Panel by RT-PCR (Flu A&B, Covid) Nasopharyngeal Swab     Status: None   Collection Time: 09/12/21  6:43 PM   Specimen: Nasopharyngeal Swab; Nasopharyngeal(NP) swabs in vial transport medium  Result Value Ref Range   SARS Coronavirus 2 by RT PCR NEGATIVE NEGATIVE   Influenza A by PCR NEGATIVE NEGATIVE   Influenza B by PCR NEGATIVE NEGATIVE     Imaging Orders         US Abdomen Limited RUQ (LIVER/GB)    CBD 3 mm 1. Cholelithiasis without sonographic findings for acute cholecystitis. 2. No common bile  duct dilatation. 3. Mild diffuse fatty infiltration of the liver with focal fatty sparing around the gallbladder fossa.    Assessment and Plan   Todd Bailey is an 60 y.o. male with abdominal pain, found to have acute cholecystitis.  He has transaminitis with normal alk phos and mild hyperbilirubinemia.  I wonder if this pattern may be secondary to severe acute cholecystitis. I recommend trending the LFTs in the morning to decide whether to proceed with laparoscopic cholecystectomy with IOC vs. additional imaging or ERCP  prior to surgery.  He will be admitted to the surgery service and kept NPO at midnight.   Felicie Morn, MD  [facility] Use [email or web address] to contact on call provider

## 2021-09-12 NOTE — ED Notes (Signed)
Report given to [facility]. 

## 2021-09-12 NOTE — ED Provider Notes (Signed)
[facility] EMERGENCY DEPARTMENT Provider Note   CSN: [id number] Arrival date & time: 09/12/21  1140     History Chief Complaint  Patient presents with   Abdominal Pain    Todd Bailey is a 60 y.o. male.  With past medical history of GERD, hypercholesterolemia, hypertension who presents to the the emergency department with right upper quadrant abdominal pain.  He states that the abdominal pain started last night after dinner after he ate a big meal of ribs.  Describes abdominal pain as bloating, constant with intermittent spurts of increased dull pain that radiates to the back.  States that he tried self-induced vomiting with mild relief of symptoms initially.  He said he woke up at 0200 with severe abdominal pain and nausea.  States that he tried Pepto-Bismol which exacerbated his pain.  Denies further episodes of nausea or vomiting.  Endorses headache which he attributes to dehydration.  He denies chest pain, diarrhea, distention.  Denies yellowing of his skin or eyes.  Denies fever, lightheadedness or dizziness. He denies chronic alcohol use, Tylenol use, IV drug use, recent travel or hiking.  Denies urinary symptoms.  Abdominal Pain Associated symptoms: nausea, shortness of breath and vomiting   Associated symptoms: no chest pain, no diarrhea, no dysuria and no fever       Past Medical History:  Diagnosis Date   Arthritis    GERD (gastroesophageal reflux disease)    Hypercholesterolemia 08/2020   TLC->imp 12/2020   Hypertension    Obesity, Class I, BMI 30-34.9    Palpitation 04/2020   Zio patch x 3d -->occ PAC and PVC; triggered events correlate with times of PVCs.  Reassured.    Patient Active Problem List   Diagnosis Date Noted   H/O adenomatous polyp of colon 04/05/2016   Suzette Battiest tenosynovitis, right 12/27/2014   Arthritis 12/06/2014   Essential hypertension 12/06/2014   GERD (gastroesophageal reflux disease) 12/06/2014   Hyperlipemia 12/06/2014    Prediabetes 12/06/2014    Past Surgical History:  Procedure Laterality Date   COLONOSCOPY  approx 2018   ? polyp, unclear on recall time--get records.   ESOPHAGEAL DILATION  2001   Dr. Madilyn Fireman.  Had it done twice (GERD-induced stricture)   Heart Rhythm monitoring  04/2020   Zio patch x 3d -->occ PAC and PVC; triggered events correlate with times of PVCs.  Reassured.       Family History  Problem Relation Age of Onset   Arthritis Mother    Hearing loss Mother    Heart disease Mother    Arthritis Father    Heart disease Father    High Cholesterol Father    High blood pressure Father     Social History   Tobacco Use   Smoking status: Former    Types: Cigarettes   Smokeless tobacco: Never  Substance Use Topics   Alcohol use: Never   Drug use: Never    Home Medications Prior to Admission medications   Medication Sig Start Date End Date Taking? Authorizing Provider  Cholecalciferol (VITAMIN D3 PO) Take by mouth daily.    [provider]  fluticasone (CUTIVATE) 0.05 % cream Apply topically 2 (two) times daily. 03/31/21   McGowen, Maryjean Morn, MD  losartan (COZAAR) 100 MG tablet Take 1 tablet (100 mg total) by mouth daily. 09/30/20   McGowen, Maryjean Morn, MD  Multiple Vitamin (MULTIVITAMIN ADULT PO) Take by mouth daily.    [provider]  Multiple Vitamins-Minerals (ZINC PO) Take by  mouth daily.    [provider]  Omega-3 Fatty Acids (FISH OIL PO) Take by mouth daily.    [provider]  omeprazole (PRILOSEC) 20 MG capsule Take 1 capsule (20 mg total) by mouth daily. 09/30/20   McGowen, Maryjean Morn, MD    Allergies    Patient has no known allergies.  Review of Systems   Review of Systems  Constitutional:  Positive for appetite change. Negative for fever.  Respiratory:  Positive for shortness of breath.   Cardiovascular:  Negative for chest pain and palpitations.  Gastrointestinal:  Positive for abdominal pain, nausea and vomiting. Negative for  diarrhea.  Genitourinary:  Negative for difficulty urinating, dysuria and flank pain.  Musculoskeletal:  Positive for back pain.  Skin:  Negative for color change.  Neurological:  Positive for headaches.  All other systems reviewed and are negative.  Physical Exam Updated Vital Signs BP 131/83 (BP Location: Right Arm)   Pulse (!) 115   Temp (S) 99.9 F (37.7 C) (Oral)   Resp 16   Ht 5\' 8"  (1.727 m)   Wt 92.1 kg   SpO2 99%   BMI 30.87 kg/m   Physical Exam Vitals and nursing note reviewed.  Constitutional:      General: He is not in acute distress.    Appearance: Normal appearance. He is well-developed. He is not toxic-appearing.  HENT:     Head: Normocephalic.     Mouth/Throat:     Mouth: Mucous membranes are moist.     Pharynx: Oropharynx is clear.  Eyes:     General: No scleral icterus.    Extraocular Movements: Extraocular movements intact.  Cardiovascular:     Rate and Rhythm: Normal rate and regular rhythm.     Heart sounds: No murmur heard. Pulmonary:     Effort: Pulmonary effort is normal.     Breath sounds: Normal breath sounds.  Abdominal:     General: Abdomen is protuberant. Bowel sounds are normal. There is no distension.     Palpations: Abdomen is soft. There is no shifting dullness, fluid wave or hepatomegaly.     Tenderness: There is abdominal tenderness in the right upper quadrant and epigastric area. There is no right CVA tenderness, left CVA tenderness or rebound. Positive signs include Murphy's sign. Negative signs include Rovsing's sign, McBurney's sign and obturator sign.  Musculoskeletal:        General: Normal range of motion.  Skin:    General: Skin is warm and dry.     Capillary Refill: Capillary refill takes less than 2 seconds.     Coloration: Skin is not jaundiced.  Neurological:     General: No focal deficit present.     Mental Status: He is alert and oriented to person, place, and time.  Psychiatric:        Mood and Affect: Mood normal.         Behavior: Behavior normal.    ED Results / Procedures / Treatments   Labs (all labs ordered are listed, but only abnormal results are displayed) Labs Reviewed  COMPREHENSIVE METABOLIC PANEL - Abnormal; Notable for the following components:      Result Value   Glucose, Bld 145 (*)    AST 500 (*)    ALT 533 (*)    Total Bilirubin 1.8 (*)    All other components within normal limits  CBC - Abnormal; Notable for the following components:   WBC 15.4 (*)    All other components  within normal limits  URINALYSIS, ROUTINE W REFLEX MICROSCOPIC - Abnormal; Notable for the following components:   Color, Urine AMBER (*)    All other components within normal limits  RESP PANEL BY RT-PCR (FLU A&B, COVID) ARPGX2  LIPASE, BLOOD  HEPATITIS PANEL, ACUTE   EKG EKG Interpretation  Date/Time:  Tuesday September 12 2021 12:16:12 EDT Ventricular Rate:  91 PR Interval:  158 QRS Duration: 96 QT Interval:  362 QTC Calculation: 445 R Axis:   27 Text Interpretation: Normal sinus rhythm Incomplete right bundle branch block Borderline ECG No old tracing to compare Confirmed by Susy Frizzle [phone]) on 09/12/2021 3:42:16 PM  Radiology US Abdomen Limited RUQ (LIVER/GB)  Result Date: 09/12/2021 CLINICAL DATA:  Abdominal pain EXAM: ULTRASOUND ABDOMEN LIMITED RIGHT UPPER QUADRANT COMPARISON:  None. FINDINGS: Gallbladder: Small echogenic shadowing gallstones are noted in the gallbladder. The largest measures 1.1 cm. No gallbladder wall thickening, pericholecystic fluid or sonographic Murphy sign to suggest acute cholecystitis. Common bile duct: Diameter: 3.0 mm Liver: No hepatic lesions are identified. Slight increased echogenicity of the liver suggesting fatty infiltration. Focal fatty sparing noted around the gallbladder fossa. Portal vein is patent on color Doppler imaging with normal direction of blood flow towards the liver. Other: None. IMPRESSION: 1. Cholelithiasis without sonographic findings for  acute cholecystitis. 2. No common bile duct dilatation. 3. Mild diffuse fatty infiltration of the liver with focal fatty sparing around the gallbladder fossa. Electronically Signed   By: Rudie Meyer M.D.   On: 09/12/2021 17:47    Procedures Procedures   Medications Ordered in ED Medications  sodium chloride 0.9 % bolus 500 mL (0 mLs Intravenous Stopped 09/12/21 1719)  ibuprofen (ADVIL) tablet 800 mg (800 mg Oral Given 09/12/21 1841)    ED Course  I have reviewed the triage vital signs and the nursing notes.  Pertinent labs & imaging results that were available during my care of the patient were reviewed by me and considered in my medical decision making (see chart for details).    MDM Rules/Calculators/A&P Markee Matera is a 60 year old with HPI and physical exam as described above. Right upper quadrant pain with suspicion for cholecystitis.  His abdominal exam without peritoneal signs.  He has no evidence of acute abdomen.  He is well-appearing. Initial labs with WBC 15.4 LFTs in the 500s T bili 1.8 Obtain right upper quadrant ultrasound showing cholelithiasis without acute cholecystitis, no common bile duct dilation.  No current signs of cholangitis.  Unsure if he passed a gallstone last night when he was having apex of symptoms and given LFT elevation. Given IV fluids for decreased p.o. intake Obtained acute hepatitis panel for rule out given elevated LFTs. Lipase negative and physical exam not consistent with acute pancreatitis. No urinary symptoms so doubt urinary source of pain including pyelonephritis. No respiratory symptoms so doubt pneumonia. The presentation is not consistent with other acute, emergent causes of abdominal pain at this time. Spoke with Dr. Dossie Der who is admitting the patient to surgery.  Spoke with the patient who is agreeable with the plan. Final Clinical Impression(s) / ED Diagnoses Final diagnoses:  Abdominal pain    Rx / DC Orders ED  Discharge Orders     None        Cristopher Peru, PA-C 09/12/21 2039    Pollyann Savoy, MD 09/12/21 2302

## 2021-09-12 NOTE — ED Notes (Signed)
Report called to Vee RN at [facility].

## 2021-09-12 NOTE — ED Notes (Signed)
ED Provider at bedside. 

## 2021-09-12 NOTE — ED Triage Notes (Signed)
Pt c/o epigastric pain started 2am-states he feels pain r/t to big meal of ribs yesterday-states he made himself vomit x 1 in attempt to relieve pain-NAD-steady gait

## 2021-09-13 ENCOUNTER — Inpatient Hospital Stay (HOSPITAL_COMMUNITY): Payer: BC Managed Care – PPO

## 2021-09-13 LAB — HEPATIC FUNCTION PANEL
ALT: 851 U/L — ABNORMAL HIGH (ref 0–44)
AST: 466 U/L — ABNORMAL HIGH (ref 15–41)
Albumin: 3.7 g/dL (ref 3.5–5.0)
Alkaline Phosphatase: 122 U/L (ref 38–126)
Bilirubin, Direct: 4 mg/dL — ABNORMAL HIGH (ref 0.0–0.2)
Indirect Bilirubin: 2.5 mg/dL — ABNORMAL HIGH (ref 0.3–0.9)
Total Bilirubin: 6.5 mg/dL — ABNORMAL HIGH (ref 0.3–1.2)
Total Protein: 6.8 g/dL (ref 6.5–8.1)

## 2021-09-13 LAB — HEPATITIS PANEL, ACUTE
HCV Ab: NONREACTIVE
Hep A IgM: NONREACTIVE
Hep B C IgM: NONREACTIVE
Hepatitis B Surface Ag: NONREACTIVE

## 2021-09-13 LAB — CBC
HCT: 42 % (ref 39.0–52.0)
Hemoglobin: 14.5 g/dL (ref 13.0–17.0)
MCH: 30.1 pg (ref 26.0–34.0)
MCHC: 34.5 g/dL (ref 30.0–36.0)
MCV: 87.3 fL (ref 80.0–100.0)
Platelets: 149 10*3/uL — ABNORMAL LOW (ref 150–400)
RBC: 4.81 MIL/uL (ref 4.22–5.81)
RDW: 12.4 % (ref 11.5–15.5)
WBC: 9.3 10*3/uL (ref 4.0–10.5)
nRBC: 0 % (ref 0.0–0.2)

## 2021-09-13 LAB — BASIC METABOLIC PANEL
Anion gap: 7 (ref 5–15)
BUN: 14 mg/dL (ref 8–23)
CO2: 27 mmol/L (ref 22–32)
Calcium: 9.5 mg/dL (ref 8.9–10.3)
Chloride: 106 mmol/L (ref 98–111)
Creatinine, Ser: 0.87 mg/dL (ref 0.61–1.24)
GFR, Estimated: >60 mL/min (ref >60)
Glucose, Bld: 122 mg/dL — ABNORMAL HIGH (ref 70–99)
Potassium: 4.1 mmol/L (ref 3.5–5.1)
Sodium: 140 mmol/L (ref 135–145)

## 2021-09-13 LAB — HIV ANTIBODY (ROUTINE TESTING W REFLEX): HIV Screen 4th Generation wRfx: NONREACTIVE

## 2021-09-13 LAB — SURGICAL PCR SCREEN
MRSA, PCR: NEGATIVE
Staphylococcus aureus: POSITIVE — AB

## 2021-09-13 MED ORDER — GADOBUTROL 1 MMOL/ML IV SOLN
9.0000 mL | Freq: Once | INTRAVENOUS | Status: AC | PRN
  Administered 2021-09-13: 9 mL via INTRAVENOUS

## 2021-09-13 MED ORDER — MUPIROCIN 2 % EX OINT
1.0000 "application " | TOPICAL_OINTMENT | Freq: Two times a day (BID) | CUTANEOUS | Status: DC
  Administered 2021-09-13 – 2021-09-14 (×3): 1 via NASAL
  Filled 2021-09-13: qty 22

## 2021-09-13 MED ORDER — SODIUM CHLORIDE 0.9 % IV SOLN
2.0000 g | INTRAVENOUS | Status: DC
  Administered 2021-09-13 – 2021-09-14 (×2): 2 g via INTRAVENOUS
  Filled 2021-09-13 (×2): qty 2

## 2021-09-13 MED ORDER — CHLORHEXIDINE GLUCONATE CLOTH 2 % EX PADS
6.0000 | MEDICATED_PAD | Freq: Every day | CUTANEOUS | Status: DC
  Administered 2021-09-13: 6 via TOPICAL

## 2021-09-13 NOTE — Plan of Care (Signed)

## 2021-09-13 NOTE — H&P (View-Only) (Signed)
Pt seen & examined   RUQ TTP  Wife at [facility]  I believe the patient's symptoms are consistent with gallbladder disease.  We discussed gallbladder disease.  I discussed laparoscopic cholecystectomy with IOC in detail.  The patient was shown diagrams detailing the procedure.  We discussed the risks and benefits of a laparoscopic cholecystectomy including, but not limited to bleeding, infection, injury to surrounding structures such as the intestine or liver, bile leak, retained gallstones, need to convert to an open procedure, prolonged diarrhea, blood clots such as  DVT, common bile duct injury, anesthesia risks, and possible need for additional procedures.  We discussed the typical post-operative recovery course. I explained that the likelihood of improvement of their symptoms is good.  I explained that there is a likelihood that his surgery may be moved to Thursday depending on how my cases throughout the afternoon go.  Diet this evening Npo after MN except meds  Mary Sella. Andrey Campanile, MD, FACS General, Bariatric, & Minimally Invasive Surgery [facility], [place]

## 2021-09-13 NOTE — Progress Notes (Signed)
Pt seen & examined   RUQ TTP  Wife at [facility]  I believe the patient's symptoms are consistent with gallbladder disease.  We discussed gallbladder disease.  I discussed laparoscopic cholecystectomy with IOC in detail.  The patient was shown diagrams detailing the procedure.  We discussed the risks and benefits of a laparoscopic cholecystectomy including, but not limited to bleeding, infection, injury to surrounding structures such as the intestine or liver, bile leak, retained gallstones, need to convert to an open procedure, prolonged diarrhea, blood clots such as  DVT, common bile duct injury, anesthesia risks, and possible need for additional procedures.  We discussed the typical post-operative recovery course. I explained that the likelihood of improvement of their symptoms is good.  I explained that there is a likelihood that his surgery may be moved to Thursday depending on how my cases throughout the afternoon go.  Diet this evening Npo after MN except meds  Britney Newstrom M. Chellie Vanlue, MD, FACS General, Bariatric, & Minimally Invasive Surgery [facility]   

## 2021-09-14 ENCOUNTER — Encounter (HOSPITAL_COMMUNITY): Admission: EM | Disposition: A | Discharge status: Home / Self Care | Payer: Self-pay | Source: Home / Self Care

## 2021-09-14 ENCOUNTER — Inpatient Hospital Stay (HOSPITAL_COMMUNITY): Payer: BC Managed Care – PPO | Admitting: Anesthesiology

## 2021-09-14 ENCOUNTER — Inpatient Hospital Stay (HOSPITAL_COMMUNITY): Payer: BC Managed Care – PPO

## 2021-09-14 ENCOUNTER — Encounter (HOSPITAL_COMMUNITY): Payer: Self-pay

## 2021-09-14 DIAGNOSIS — K8000 Calculus of gallbladder with acute cholecystitis without obstruction: Secondary | ICD-10-CM | POA: Diagnosis not present

## 2021-09-14 DIAGNOSIS — I10 Essential (primary) hypertension: Secondary | ICD-10-CM | POA: Diagnosis not present

## 2021-09-14 DIAGNOSIS — E785 Hyperlipidemia, unspecified: Secondary | ICD-10-CM | POA: Diagnosis not present

## 2021-09-14 DIAGNOSIS — K219 Gastro-esophageal reflux disease without esophagitis: Secondary | ICD-10-CM | POA: Diagnosis not present

## 2021-09-14 HISTORY — PX: CHOLECYSTECTOMY: SHX55

## 2021-09-14 LAB — HEPATIC FUNCTION PANEL
ALT: 547 U/L — ABNORMAL HIGH (ref 0–44)
AST: 175 U/L — ABNORMAL HIGH (ref 15–41)
Albumin: 3.4 g/dL — ABNORMAL LOW (ref 3.5–5.0)
Alkaline Phosphatase: 115 U/L (ref 38–126)
Bilirubin, Direct: 3.5 mg/dL — ABNORMAL HIGH (ref 0.0–0.2)
Indirect Bilirubin: 1.7 mg/dL — ABNORMAL HIGH (ref 0.3–0.9)
Total Bilirubin: 5.2 mg/dL — ABNORMAL HIGH (ref 0.3–1.2)
Total Protein: 6.6 g/dL (ref 6.5–8.1)

## 2021-09-14 LAB — BASIC METABOLIC PANEL
Anion gap: 7 (ref 5–15)
BUN: 17 mg/dL (ref 8–23)
CO2: 25 mmol/L (ref 22–32)
Calcium: 8.9 mg/dL (ref 8.9–10.3)
Chloride: 103 mmol/L (ref 98–111)
Creatinine, Ser: 1.02 mg/dL (ref 0.61–1.24)
GFR, Estimated: >60 mL/min (ref >60)
Glucose, Bld: 111 mg/dL — ABNORMAL HIGH (ref 70–99)
Potassium: 3.8 mmol/L (ref 3.5–5.1)
Sodium: 135 mmol/L (ref 135–145)

## 2021-09-14 LAB — CBC
HCT: 40.9 % (ref 39.0–52.0)
Hemoglobin: 14.3 g/dL (ref 13.0–17.0)
MCH: 30.6 pg (ref 26.0–34.0)
MCHC: 35 g/dL (ref 30.0–36.0)
MCV: 87.6 fL (ref 80.0–100.0)
Platelets: 149 10*3/uL — ABNORMAL LOW (ref 150–400)
RBC: 4.67 MIL/uL (ref 4.22–5.81)
RDW: 12.5 % (ref 11.5–15.5)
WBC: 6.4 10*3/uL (ref 4.0–10.5)
nRBC: 0 % (ref 0.0–0.2)

## 2021-09-14 SURGERY — LAPAROSCOPIC CHOLECYSTECTOMY WITH INTRAOPERATIVE CHOLANGIOGRAM
Anesthesia: General | Site: Abdomen | Laterality: Right

## 2021-09-14 MED ORDER — KETOROLAC TROMETHAMINE 30 MG/ML IJ SOLN
INTRAMUSCULAR | Status: AC
  Filled 2021-09-14: qty 1

## 2021-09-14 MED ORDER — ROCURONIUM BROMIDE 10 MG/ML (PF) SYRINGE
PREFILLED_SYRINGE | INTRAVENOUS | Status: AC
  Filled 2021-09-14: qty 10

## 2021-09-14 MED ORDER — KETOROLAC TROMETHAMINE 30 MG/ML IJ SOLN
INTRAMUSCULAR | Status: DC | PRN
  Administered 2021-09-14: 30 mg via INTRAVENOUS

## 2021-09-14 MED ORDER — PROPOFOL 10 MG/ML IV BOLUS
INTRAVENOUS | Status: AC
  Filled 2021-09-14: qty 20

## 2021-09-14 MED ORDER — DEXAMETHASONE SODIUM PHOSPHATE 10 MG/ML IJ SOLN
INTRAMUSCULAR | Status: AC
  Filled 2021-09-14: qty 1

## 2021-09-14 MED ORDER — EPHEDRINE 5 MG/ML INJ
INTRAVENOUS | Status: AC
  Filled 2021-09-14: qty 5

## 2021-09-14 MED ORDER — FENTANYL CITRATE (PF) 100 MCG/2ML IJ SOLN
INTRAMUSCULAR | Status: AC
  Filled 2021-09-14: qty 2

## 2021-09-14 MED ORDER — LIDOCAINE 2% (20 MG/ML) 5 ML SYRINGE
INTRAMUSCULAR | Status: DC | PRN
  Administered 2021-09-14: 80 mg via INTRAVENOUS

## 2021-09-14 MED ORDER — INDOCYANINE GREEN 25 MG IV SOLR
INTRAVENOUS | Status: DC | PRN
  Administered 2021-09-14: 5 mg via INTRAVENOUS

## 2021-09-14 MED ORDER — BUPIVACAINE-EPINEPHRINE (PF) 0.25% -1:200000 IJ SOLN
INTRAMUSCULAR | Status: AC
  Filled 2021-09-14: qty 30

## 2021-09-14 MED ORDER — FENTANYL CITRATE (PF) 100 MCG/2ML IJ SOLN
INTRAMUSCULAR | Status: DC | PRN
  Administered 2021-09-14 (×2): 50 ug via INTRAVENOUS
  Administered 2021-09-14: 25 ug via INTRAVENOUS
  Administered 2021-09-14: 50 ug via INTRAVENOUS
  Administered 2021-09-14: 25 ug via INTRAVENOUS

## 2021-09-14 MED ORDER — DEXAMETHASONE SODIUM PHOSPHATE 10 MG/ML IJ SOLN
INTRAMUSCULAR | Status: DC | PRN
  Administered 2021-09-14: 8 mg via INTRAVENOUS

## 2021-09-14 MED ORDER — LACTATED RINGERS IV SOLN
INTRAVENOUS | Status: AC | PRN
  Administered 2021-09-14: 1000 mL

## 2021-09-14 MED ORDER — CHLORHEXIDINE GLUCONATE 0.12 % MT SOLN
15.0000 mL | Freq: Once | OROMUCOSAL | Status: AC
  Administered 2021-09-14: 15 mL via OROMUCOSAL

## 2021-09-14 MED ORDER — MIDAZOLAM HCL 2 MG/2ML IJ SOLN
INTRAMUSCULAR | Status: AC
  Filled 2021-09-14: qty 2

## 2021-09-14 MED ORDER — SUGAMMADEX SODIUM 200 MG/2ML IV SOLN
INTRAVENOUS | Status: DC | PRN
  Administered 2021-09-14: 200 mg via INTRAVENOUS

## 2021-09-14 MED ORDER — LIDOCAINE HCL (PF) 2 % IJ SOLN
INTRAMUSCULAR | Status: AC
  Filled 2021-09-14: qty 5

## 2021-09-14 MED ORDER — EPHEDRINE SULFATE-NACL 50-0.9 MG/10ML-% IV SOSY
PREFILLED_SYRINGE | INTRAVENOUS | Status: DC | PRN
  Administered 2021-09-14 (×2): 5 mg via INTRAVENOUS

## 2021-09-14 MED ORDER — ROCURONIUM BROMIDE 10 MG/ML (PF) SYRINGE
PREFILLED_SYRINGE | INTRAVENOUS | Status: DC | PRN
  Administered 2021-09-14: 10 mg via INTRAVENOUS
  Administered 2021-09-14: 60 mg via INTRAVENOUS

## 2021-09-14 MED ORDER — PROPOFOL 10 MG/ML IV BOLUS
INTRAVENOUS | Status: DC | PRN
  Administered 2021-09-14: 180 mg via INTRAVENOUS

## 2021-09-14 MED ORDER — SODIUM CHLORIDE 0.9 % IV SOLN: INTRAVENOUS | Status: DC

## 2021-09-14 MED ORDER — OXYCODONE HCL 5 MG PO TABS: 5.0000 mg | ORAL_TABLET | ORAL | 0 refills | Status: DC | PRN

## 2021-09-14 MED ORDER — ONDANSETRON HCL 4 MG/2ML IJ SOLN
INTRAMUSCULAR | Status: DC | PRN
  Administered 2021-09-14: 4 mg via INTRAVENOUS

## 2021-09-14 MED ORDER — ONDANSETRON HCL 4 MG/2ML IJ SOLN
INTRAMUSCULAR | Status: AC
  Filled 2021-09-14: qty 2

## 2021-09-14 MED ORDER — INDOCYANINE GREEN 25 MG IV SOLR
INTRAVENOUS | Status: AC
  Filled 2021-09-14: qty 10

## 2021-09-14 MED ORDER — SODIUM CHLORIDE 0.9 % IV SOLN: INTRAVENOUS | Status: DC | PRN

## 2021-09-14 MED ORDER — LACTATED RINGERS IV SOLN: INTRAVENOUS | Status: DC

## 2021-09-14 MED ORDER — MIDAZOLAM HCL 5 MG/5ML IJ SOLN
INTRAMUSCULAR | Status: DC | PRN
  Administered 2021-09-14: 2 mg via INTRAVENOUS

## 2021-09-14 MED ORDER — BUPIVACAINE-EPINEPHRINE 0.25% -1:200000 IJ SOLN
INTRAMUSCULAR | Status: DC | PRN
  Administered 2021-09-14: 15 mL

## 2021-09-14 SURGICAL SUPPLY — 50 items
APPLICATOR ARISTA FLEXITIP XL (MISCELLANEOUS) IMPLANT
APPLIER CLIP 5 13 M/L LIGAMAX5 (MISCELLANEOUS) ×2
APPLIER CLIP ROT 10 11.4 M/L (STAPLE)
BAG COUNTER SPONGE SURGICOUNT (BAG) IMPLANT
BENZOIN TINCTURE PRP APPL 2/3 (GAUZE/BANDAGES/DRESSINGS) IMPLANT
BNDG ADH 1X3 SHEER STRL LF (GAUZE/BANDAGES/DRESSINGS) ×8 IMPLANT
CABLE HIGH FREQUENCY MONO STRZ (ELECTRODE) ×2 IMPLANT
CHLORAPREP W/TINT 26 (MISCELLANEOUS) ×2 IMPLANT
CLIP APPLIE 5 13 M/L LIGAMAX5 (MISCELLANEOUS) IMPLANT
CLIP APPLIE ROT 10 11.4 M/L (STAPLE) IMPLANT
CLIP LIGATING HEMO O LOK GREEN (MISCELLANEOUS) IMPLANT
COVER MAYO STAND STRL (DRAPES) IMPLANT
COVER SURGICAL LIGHT HANDLE (MISCELLANEOUS) ×2 IMPLANT
DECANTER SPIKE VIAL GLASS SM (MISCELLANEOUS) ×2 IMPLANT
DERMABOND ADVANCED (GAUZE/BANDAGES/DRESSINGS)
DERMABOND ADVANCED .7 DNX12 (GAUZE/BANDAGES/DRESSINGS) IMPLANT
DRAPE C-ARM 42X120 X-RAY (DRAPES) ×1 IMPLANT
DRSG TEGADERM 2-3/8X2-3/4 SM (GAUZE/BANDAGES/DRESSINGS) IMPLANT
ELECT REM PT RETURN 15FT ADLT (MISCELLANEOUS) ×2 IMPLANT
GAUZE SPONGE 2X2 8PLY STRL LF (GAUZE/BANDAGES/DRESSINGS) IMPLANT
GLOVE SRG 8 PF TXTR STRL LF DI (GLOVE) ×1 IMPLANT
GLOVE SURG MICRO LTX SZ7.5 (GLOVE) ×2 IMPLANT
GLOVE SURG UNDER POLY LF SZ8 (GLOVE) ×1
GOWN STRL REUS W/TWL XL LVL3 (GOWN DISPOSABLE) ×6 IMPLANT
GRASPER SUT TROCAR 14GX15 (MISCELLANEOUS) ×1 IMPLANT
HEMOSTAT ARISTA ABSORB 3G PWDR (HEMOSTASIS) IMPLANT
HEMOSTAT SNOW SURGICEL 2X4 (HEMOSTASIS) IMPLANT
KIT BASIN OR (CUSTOM PROCEDURE TRAY) ×2 IMPLANT
KIT TURNOVER KIT A (KITS) ×2 IMPLANT
L-HOOK LAP DISP 36CM (ELECTROSURGICAL)
LHOOK LAP DISP 36CM (ELECTROSURGICAL) IMPLANT
POUCH RETRIEVAL ECOSAC 10 (ENDOMECHANICALS) ×1 IMPLANT
POUCH RETRIEVAL ECOSAC 10MM (ENDOMECHANICALS) ×1
SCISSORS LAP 5X35 DISP (ENDOMECHANICALS) ×2 IMPLANT
SET CHOLANGIOGRAPH MIX (MISCELLANEOUS) IMPLANT
SET IRRIG TUBING LAPAROSCOPIC (IRRIGATION / IRRIGATOR) ×2 IMPLANT
SET TUBE SMOKE EVAC HIGH FLOW (TUBING) ×2 IMPLANT
SLEEVE XCEL OPT CAN 5 100 (ENDOMECHANICALS) ×4 IMPLANT
SPONGE GAUZE 2X2 STER 10/PKG (GAUZE/BANDAGES/DRESSINGS)
STRIP CLOSURE SKIN 1/2X4 (GAUZE/BANDAGES/DRESSINGS) IMPLANT
SUT MNCRL AB 4-0 PS2 18 (SUTURE) ×2 IMPLANT
SUT VIC AB 0 UR5 27 (SUTURE) IMPLANT
SUT VICRYL 0 TIES 12 18 (SUTURE) IMPLANT
SUT VICRYL 0 UR6 27IN ABS (SUTURE) ×1 IMPLANT
TOWEL OR 17X26 10 PK STRL BLUE (TOWEL DISPOSABLE) ×2 IMPLANT
TOWEL OR NON WOVEN STRL DISP B (DISPOSABLE) ×2 IMPLANT
TRAY LAPAROSCOPIC (CUSTOM PROCEDURE TRAY) ×2 IMPLANT
TROCAR BLADELESS OPT 5 100 (ENDOMECHANICALS) ×2 IMPLANT
TROCAR XCEL BLUNT TIP 100MML (ENDOMECHANICALS) ×1 IMPLANT
TROCAR XCEL NON-BLD 11X100MML (ENDOMECHANICALS) IMPLANT

## 2021-09-14 NOTE — Discharge Summary (Signed)
[facility] Discharge Summary   Patient ID: Todd Bailey MRN: [id number] DOB/AGE: 1961-09-27 60 y.o.  Admit date: 09/12/2021 Discharge date: 09/14/2021  Admitting Diagnosis: Acute cholecystitis  Discharge Diagnosis Acute cholecystitis S/P laparoscopic cholecystectomy  Consultants None  Imaging: DG Cholangiogram Operative  Result Date: 09/14/2021 CLINICAL DATA:  Intraoperative cholangiogram EXAM: INTRAOPERATIVE CHOLANGIOGRAM TECHNIQUE: Cholangiographic images from the C-arm fluoroscopic device were submitted for interpretation post-operatively. Please see the procedural report for the amount of contrast and the fluoroscopy time utilized. COMPARISON:  None. FINDINGS: A single fluoroscopic spot image is submitted for review. There is surgical instrumentation overlying the gallbladder fossa where there are surgical clips. Contrast material is seen opacifying both the intrahepatic and extrahepatic biliary ducts. There is passage of contrast material into the small bowel. No apparent filling defects within the biliary tree. IMPRESSION: Intraoperative cholangiogram as described. Refer to operative report for full details. Electronically Signed   By: Olive Bass M.D.   On: 09/14/2021 11:36   MR 3D Recon At Scanner  Result Date: 09/13/2021 CLINICAL DATA:  Jaundice and abdominal pain. EXAM: MRI ABDOMEN WITHOUT AND WITH CONTRAST (INCLUDING MRCP) TECHNIQUE: Multiplanar multisequence MR imaging of the abdomen was performed both before and after the administration of intravenous contrast. Heavily T2-weighted images of the biliary and pancreatic ducts were obtained, and three-dimensional MRCP images were rendered by post processing. CONTRAST:  15mL GADAVIST GADOBUTROL 1 MMOL/ML IV SOLN COMPARISON:  Abdominal ultrasound 09/12/2021 FINDINGS: Lower chest: Unremarkable. Hepatobiliary: No suspicious focal abnormality within the liver parenchyma. Diffuse loss of signal intensity in the liver  parenchyma on out of phase T1 imaging is compatible with fatty deposition. There is no evidence for gallstones, gallbladder wall thickening, or pericholecystic fluid. No intrahepatic or extrahepatic biliary dilation. Common bile duct diameter is 4-5 mm. Tiny layering gallstones evident. No choledocholithiasis. Pancreas: No focal mass lesion. No dilatation of the main duct. No intraparenchymal cyst. No peripancreatic edema. Spleen:  No splenomegaly. No focal mass lesion. Adrenals/Urinary Tract: No adrenal nodule or mass. Kidneys unremarkable. Stomach/Bowel: Small hiatal hernia. Stomach otherwise unremarkable. Duodenum is normally positioned as is the ligament of Treitz. No small bowel or colonic dilatation within the visualized abdomen. Diverticular changes noted left colon. Vascular/Lymphatic: No abdominal aortic aneurysm. No abdominal lymphadenopathy Other:  No intraperitoneal free fluid. Musculoskeletal: No focal suspicious marrow enhancement within the visualized bony anatomy. IMPRESSION: 1. Cholelithiasis without choledocholithiasis. No intrahepatic or extrahepatic biliary dilation. 2. Hepatic steatosis. 3. Small hiatal hernia. Electronically Signed   By: Kennith Center M.D.   On: 09/13/2021 10:54   MR ABDOMEN MRCP W WO CONTAST  Result Date: 09/13/2021 CLINICAL DATA:  Jaundice and abdominal pain. EXAM: MRI ABDOMEN WITHOUT AND WITH CONTRAST (INCLUDING MRCP) TECHNIQUE: Multiplanar multisequence MR imaging of the abdomen was performed both before and after the administration of intravenous contrast. Heavily T2-weighted images of the biliary and pancreatic ducts were obtained, and three-dimensional MRCP images were rendered by post processing. CONTRAST:  28mL GADAVIST GADOBUTROL 1 MMOL/ML IV SOLN COMPARISON:  Abdominal ultrasound 09/12/2021 FINDINGS: Lower chest: Unremarkable. Hepatobiliary: No suspicious focal abnormality within the liver parenchyma. Diffuse loss of signal intensity in the liver parenchyma on out  of phase T1 imaging is compatible with fatty deposition. There is no evidence for gallstones, gallbladder wall thickening, or pericholecystic fluid. No intrahepatic or extrahepatic biliary dilation. Common bile duct diameter is 4-5 mm. Tiny layering gallstones evident. No choledocholithiasis. Pancreas: No focal mass lesion. No dilatation of the main duct. No intraparenchymal cyst. No peripancreatic edema. Spleen:  No splenomegaly. No focal mass lesion. Adrenals/Urinary Tract: No adrenal nodule or mass. Kidneys unremarkable. Stomach/Bowel: Small hiatal hernia. Stomach otherwise unremarkable. Duodenum is normally positioned as is the ligament of Treitz. No small bowel or colonic dilatation within the visualized abdomen. Diverticular changes noted left colon. Vascular/Lymphatic: No abdominal aortic aneurysm. No abdominal lymphadenopathy Other:  No intraperitoneal free fluid. Musculoskeletal: No focal suspicious marrow enhancement within the visualized bony anatomy. IMPRESSION: 1. Cholelithiasis without choledocholithiasis. No intrahepatic or extrahepatic biliary dilation. 2. Hepatic steatosis. 3. Small hiatal hernia. Electronically Signed   By: Kennith Center M.D.   On: 09/13/2021 10:54   US Abdomen Limited RUQ (LIVER/GB)  Result Date: 09/12/2021 CLINICAL DATA:  Abdominal pain EXAM: ULTRASOUND ABDOMEN LIMITED RIGHT UPPER QUADRANT COMPARISON:  None. FINDINGS: Gallbladder: Small echogenic shadowing gallstones are noted in the gallbladder. The largest measures 1.1 cm. No gallbladder wall thickening, pericholecystic fluid or sonographic Murphy sign to suggest acute cholecystitis. Common bile duct: Diameter: 3.0 mm Liver: No hepatic lesions are identified. Slight increased echogenicity of the liver suggesting fatty infiltration. Focal fatty sparing noted around the gallbladder fossa. Portal vein is patent on color Doppler imaging with normal direction of blood flow towards the liver. Other: None. IMPRESSION: 1.  Cholelithiasis without sonographic findings for acute cholecystitis. 2. No common bile duct dilatation. 3. Mild diffuse fatty infiltration of the liver with focal fatty sparing around the gallbladder fossa. Electronically Signed   By: Rudie Meyer M.D.   On: 09/12/2021 17:47    Procedures Dr. [name] (09/14/21) - Laparoscopic Cholecystectomy with [facility]   Hospital Course:  Patient is a 60 year old male who presented to [facility] with abdominal pain.  Workup showed acute cholecystitis. Patient was transferred to [facility] for surgical admission.  Patient was admitted and noted to have elevated LFTs and had MRCP. MRCP was negative for choledocholithiasis. He underwent procedure listed above.  Tolerated procedure well and was transferred to the floor.  Diet was advanced as tolerated.  On POD0, the patient was voiding well, tolerating diet, ambulating well, pain well controlled, vital signs stable, incisions c/d/i and felt stable for discharge home.  Patient will follow up in our office in 3 weeks and knows to call with questions or concerns.  He will call to confirm appointment date/time.    Physical Exam: General:  Alert, NAD, pleasant, comfortable Abd:  Soft, ND, mild tenderness, incisions C/D/I  I or a member of my team have reviewed this patient in the Controlled Substance Database.   Allergies as of 09/14/2021   No Known Allergies      Medication List     STOP taking these medications    fluticasone 0.05 % cream Commonly known as: CUTIVATE       TAKE these medications    acetaminophen 325 MG tablet Commonly known as: TYLENOL Take 650 mg by mouth every 6 (six) hours as needed for mild pain or headache.   FISH OIL PO Take 1 capsule by mouth daily with breakfast.   K2 PLUS D3 PO Take 1 capsule by mouth daily with breakfast.   losartan 100 MG tablet Commonly known as: COZAAR Take 1 tablet (100 mg total) by mouth daily.   MULTIVITAMIN ADULT PO Take 1 tablet by mouth daily  with breakfast.   NON FORMULARY Take 1 tablet by mouth See admin instructions. Zinc/Magnesium/Potassium tablets- Take 1 tablet by mouth once a day with food   omeprazole 20 MG capsule Commonly known as: PRILOSEC Take 1 capsule (20 mg  total) by mouth daily. What changed: when to take this   oxyCODONE 5 MG immediate release tablet Commonly known as: Oxy IR/ROXICODONE Take 1 tablet (5 mg total) by mouth every 4 (four) hours as needed for moderate pain or severe pain.   Vitamin D3 Super Strength 50 MCG (2000 UT) Caps Generic drug: Cholecalciferol Take 2,000 Units by mouth daily.          Follow-up Information     [facility], [facility] Follow up.   Specialty: General Surgery Why: Our office is scheduling a follow up appointment in 3-4 weeks. Please call to confirm appointment date/time. Please arrive 30 min prior to appointment time and have photo ID and insurance card with you. Contact information: [address] [place] [address] [phone]                 Signed: Juliet Rude , [facility] 09/14/2021, 4:00 PM Please see Amion for pager number during day hours 7:00am-4:30pm

## 2021-09-14 NOTE — Plan of Care (Signed)

## 2021-09-14 NOTE — Transfer of Care (Signed)
Immediate Anesthesia Transfer of Care Note  Patient: Todd Bailey  Procedure(s) Performed: LAPAROSCOPIC CHOLECYSTECTOMY WITH INTRAOPERATIVE CHOLANGIOGRAM, (Right: Abdomen)  Patient Location: PACU  Anesthesia Type:General  Level of Consciousness: awake, alert , oriented and patient cooperative  Airway & Oxygen Therapy: Patient Spontanous Breathing and Patient connected to face mask oxygen  Post-op Assessment: Report given to RN, Post -op Vital signs reviewed and stable and Patient moving all extremities  Post vital signs: Reviewed and stable  Last Vitals:  Vitals Value Taken Time  BP 157/94 09/14/21 1154  Temp 36.6 C 09/14/21 1154  Pulse 98 09/14/21 1155  Resp 15 09/14/21 1155  SpO2 96 % 09/14/21 1155  Vitals shown include unvalidated device data.  Last Pain:  Vitals:   09/14/21 0925  TempSrc:   PainSc: 0-No pain      Patients Stated Pain Goal: 2 (09/13/21 1915)  Complications: No notable events documented.

## 2021-09-14 NOTE — Progress Notes (Signed)
Assessment unchanged. Pt and wife verbalized understanding of of dc instructions through teach back including meds and follow up care. Discharged via wc to front entrance accompanied by NT.

## 2021-09-14 NOTE — Anesthesia Procedure Notes (Signed)
Procedure Name: Intubation Date/Time: 09/14/2021 10:31 AM Performed by: Victoriano Lain, CRNA Pre-anesthesia Checklist: Patient identified, Emergency Drugs available, Suction available, Patient being monitored and Timeout performed Patient Re-evaluated:Patient Re-evaluated prior to induction Oxygen Delivery Method: Circle system utilized Preoxygenation: Pre-oxygenation with 100% oxygen Induction Type: IV induction Ventilation: Mask ventilation without difficulty Laryngoscope Size: Mac and 4 Grade View: Grade I Tube type: Oral Tube size: 7.5 mm Number of attempts: 1 Airway Equipment and Method: Stylet Placement Confirmation: ETT inserted through vocal cords under direct vision, positive ETCO2 and breath sounds checked- equal and bilateral Secured at: 21 cm Tube secured with: Tape Dental Injury: Teeth and Oropharynx as per pre-operative assessment

## 2021-09-14 NOTE — Interval H&P Note (Signed)
History and Physical Interval Note:  09/14/2021 9:41 AM  Todd Bailey  has presented today for surgery, with the diagnosis of Cholecystitis.  The various methods of treatment have been discussed with the patient and family. After consideration of risks, benefits and other options for treatment, the patient has consented to  Procedure(s): LAPAROSCOPIC CHOLECYSTECTOMY WITH INTRAOPERATIVE CHOLANGIOGRAM,  Possible IOC (Right) as a surgical intervention.  The patient's history has been reviewed, patient examined, no change in status, stable for surgery.  I have reviewed the patient's chart and labs.  Questions were answered to the patient's satisfaction.     [name]

## 2021-09-14 NOTE — Anesthesia Preprocedure Evaluation (Addendum)
Anesthesia Evaluation  Patient identified by MRN, date of birth, ID band Patient awake    Reviewed: Allergy & Precautions, NPO status , Patient's Chart, lab work & pertinent test results  Airway Mallampati: I  TM Distance: >3 FB Neck ROM: Full    Dental  (+) Teeth Intact, Dental Advisory Given   Pulmonary former smoker,    breath sounds clear to auscultation       Cardiovascular hypertension, Pt. on medications  Rhythm:Regular Rate:Normal     Neuro/Psych negative neurological ROS  negative psych ROS   GI/Hepatic Neg liver ROS, GERD  Medicated,  Endo/Other  negative endocrine ROS  Renal/GU negative Renal ROS     Musculoskeletal  (+) Arthritis ,   Abdominal Normal abdominal exam  (+)   Peds  Hematology negative hematology ROS (+)   Anesthesia Other Findings   Reproductive/Obstetrics                            Anesthesia Physical Anesthesia Plan  ASA: 2  Anesthesia Plan: General   Post-op Pain Management:    Induction: Intravenous  PONV Risk Score and Plan: 3 and Ondansetron, Dexamethasone and Midazolam  Airway Management Planned: Oral ETT  Additional Equipment: None  Intra-op Plan:   Post-operative Plan: Extubation in OR  Informed Consent: I have reviewed the patients History and Physical, chart, labs and discussed the procedure including the risks, benefits and alternatives for the proposed anesthesia with the patient or authorized representative who has indicated his/her understanding and acceptance.     Dental advisory given  Plan Discussed with: CRNA  Anesthesia Plan Comments:        Anesthesia Quick Evaluation

## 2021-09-14 NOTE — Anesthesia Postprocedure Evaluation (Signed)
Anesthesia Post Note  Patient: ARNEY MAYABB  Procedure(s) Performed: LAPAROSCOPIC CHOLECYSTECTOMY WITH INTRAOPERATIVE CHOLANGIOGRAM, (Right: Abdomen)     Patient location during evaluation: PACU Anesthesia Type: General Level of consciousness: awake and alert Pain management: pain level controlled Vital Signs Assessment: post-procedure vital signs reviewed and stable Respiratory status: spontaneous breathing, nonlabored ventilation, respiratory function stable and patient connected to nasal cannula oxygen Cardiovascular status: blood pressure returned to baseline and stable Postop Assessment: no apparent nausea or vomiting Anesthetic complications: no   No notable events documented.  Last Vitals:  Vitals:   09/14/21 1350 09/14/21 1450  BP: (!) 153/89 (!) 163/95  Pulse: 78 82  Resp: 14 14  Temp: (!) 36.4 C 36.7 C  SpO2: 94% 95%    Last Pain:  Vitals:   09/14/21 1450  TempSrc: Oral  PainSc: 1                  Shelton Silvas

## 2021-09-14 NOTE — Op Note (Addendum)
NAVEED HUMPHRES [id number] 1961-05-13 09/14/2021  Laparoscopic Cholecystectomy with IOC & ICG immunofluorescent procedure Note  Indications: This patient presents with symptomatic gallbladder disease and will undergo laparoscopic cholecystectomy.  Patient had a bump in his LFTs preoperatively prior to undergoing surgery.  He had a preoperative MRCP which did not reveal any evidence of choledocholithiasis.  This morning his LFTs are starting to trend downward.  We see chart for additional details  Pre-operative Diagnosis: Calculus of gallbladder with acute cholecystitis, without mention of obstruction  Post-operative Diagnosis: Same  Surgeon: [name] MD FACS  Assistants: Barnetta Chapel PA-C  Anesthesia: General endotracheal anesthesia  Procedure Details  The patient was seen again in the Holding Room. The risks, benefits, complications, treatment options, and expected outcomes were discussed with the patient. The possibilities of reaction to medication, pulmonary aspiration, perforation of viscus, bleeding, recurrent infection, finding a normal gallbladder, the need for additional procedures, failure to diagnose a condition, the possible need to convert to an open procedure, and creating a complication requiring transfusion or operation were discussed with the patient. The likelihood of improving the patient's symptoms with return to their baseline status is good.  The patient and/or family concurred with the proposed plan, giving informed consent. The site of surgery properly noted. The patient was taken to Operating Room, identified as TORIAN QUINTERO and the procedure verified as Laparoscopic Cholecystectomy with Intraoperative Cholangiogram. A Time Out was held and the above information confirmed. Antibiotic prophylaxis was administered.  Patient received injection of ICG dye in short stay.  Prior to the induction of general anesthesia, antibiotic prophylaxis was administered. General  endotracheal anesthesia was then administered and tolerated well. After the induction, the abdomen was prepped with Chloraprep and draped in the sterile fashion. The patient was positioned in the supine position.  Local anesthetic agent was injected into the skin near the umbilicus and an incision made. We dissected down to the abdominal fascia with blunt dissection.  The fascia was incised vertically and we entered the peritoneal cavity bluntly.  A pursestring suture of 0-Vicryl was placed around the fascial opening.  The Hasson cannula was inserted and secured with the stay suture.  Pneumoperitoneum was then created with CO2 and tolerated well without any adverse changes in the patient's vital signs. An 5-mm port was placed in the subxiphoid position.  Two 5-mm ports were placed in the right upper quadrant. All skin incisions were infiltrated with a local anesthetic agent before making the incision and placing the trocars.   We positioned the patient in reverse Trendelenburg, tilted slightly to the patient's left.  The gallbladder was identified, the fundus grasped and retracted cephalad. There was edema in the gallbladder wall. Adhesions were lysed bluntly and with the electrocautery where indicated, taking care not to injure any adjacent organs or viscus. The infundibulum was grasped and retracted laterally, exposing the peritoneum overlying the triangle of Calot. This was then divided and exposed in a blunt fashion. A critical view of the cystic duct and cystic artery was obtained.  The cystic duct was clearly identified and bluntly dissected circumferentially.  I icg immunofluorescent optics were activated.  Immunofluorescence was seen in the liver but there was no immunofluorescence seen in the gallbladder or any cystic duct or evidence of common bile duct.  However there were no other structures entering the gallbladder.  There is just a cystic duct and the cystic artery.  I had a very large critical  view.  There is a small anterior branch  of the cystic artery running on top of the cystic duct.  In order to perform a cholangiogram I felt I needed to ligate this anterior branch that was running on top of the cystic duct.  It was ligated with a clip proximally and 1 distally and then transected.  The cystic duct was ligated with a clip distally.   An incision was made in the cystic duct and the [facility] cholangiogram catheter introduced. The catheter was secured using a clip. A cholangiogram was then obtained which showed good visualization of the distal and proximal biliary tree with no sign of filling defects or obstruction.  Contrast flowed easily into the duodenum. The catheter was then removed.   The cystic duct was then ligated with clips and divided. The cystic artery which had been identified & dissected free was ligated with clips and divided as well.   The gallbladder was dissected from the liver bed in retrograde fashion with the electrocautery. The gallbladder was removed and placed in an Ecco sac.  The gallbladder and Ecco sac were then removed through the umbilical port site. The liver bed was irrigated and inspected. Hemostasis was achieved with the electrocautery. Copious irrigation was utilized and was repeatedly aspirated until clear.  The pursestring suture was used to close the umbilical fascia.  An additional interrupted 0 Vicryl was placed at the umbilical fascia with a PMI suture passer with laparoscopic guidance.  We again inspected the right upper quadrant for hemostasis.  The umbilical closure was inspected and there was no air leak and nothing trapped within the closure. Pneumoperitoneum was released as we removed the trocars.  4-0 Monocryl was used to close the skin.   Dermabond was applied. The patient was then extubated and brought to the recovery room in stable condition. Instrument, sponge, and needle counts were correct at closure and at the conclusion of the case.    Findings: Acute cholecystitis with Cholelithiasis +critical view Nml IOC  Estimated Blood Loss: Minimal         Drains: none         Specimens: Gallbladder           Complications: None; patient tolerated the procedure well.         Disposition: PACU - hemodynamically stable.         Condition: stable  Mary Sella. Andrey Campanile, MD, FACS General, Bariatric, & Minimally Invasive Surgery [facility], [place]

## 2021-09-14 NOTE — Discharge Instructions (Signed)
[facility] LAPAROSCOPIC SURGERY: POST OP INSTRUCTIONS Always review your discharge instruction sheet given to you by the facility where your surgery was performed. IF YOU HAVE DISABILITY OR FAMILY LEAVE FORMS, YOU MUST BRING THEM TO THE OFFICE FOR PROCESSING.   DO NOT GIVE THEM TO YOUR DOCTOR.  PAIN CONTROL  First take acetaminophen (Tylenol) AND/or ibuprofen (Advil) to control your pain after surgery.  Follow directions on package.  Taking acetaminophen (Tylenol) and/or ibuprofen (Advil) regularly after surgery will help to control your pain and lower the amount of prescription pain medication you may need.  You should not take more than 3,000 mg (3 grams) of acetaminophen (Tylenol) in 24 hours.  You should not take ibuprofen (Advil), aleve, motrin, naprosyn or other NSAIDS if you have a history of stomach ulcers or chronic kidney disease.  A prescription for pain medication may be given to you upon discharge.  Take your pain medication as prescribed, if you still have uncontrolled pain after taking acetaminophen (Tylenol) or ibuprofen (Advil). Use ice packs to help control pain. If you need a refill on your pain medication, please contact your pharmacy.  They will contact our office to request authorization. Prescriptions will not be filled after 5pm or on week-ends.  HOME MEDICATIONS Take your usually prescribed medications unless otherwise directed.  DIET You should follow a light diet the first few days after arrival home.  Be sure to include lots of fluids daily. Avoid fatty, fried foods.   CONSTIPATION It is common to experience some constipation after surgery and if you are taking pain medication.  Increasing fluid intake and taking a stool softener (such as Colace) will usually help or prevent this problem from occurring.  A mild laxative (Milk of Magnesia or Miralax) should be taken according to package instructions if there are no bowel movements after 48  hours.  WOUND/INCISION CARE Most patients will experience some swelling and bruising in the area of the incisions.  Ice packs will help.  Swelling and bruising can take several days to resolve.  Unless discharge instructions indicate otherwise, follow guidelines below  STERI-STRIPS - you may remove your outer bandages 48 hours after surgery, and you may shower at that time.  You have steri-strips (small skin tapes) in place directly over the incision.  These strips should be left on the skin for 7-10 days.   DERMABOND/SKIN GLUE - you may shower in 24 hours.  The glue will flake off over the next 2-3 weeks. Any sutures or staples will be removed at the office during your follow-up visit.  ACTIVITIES You may resume regular (light) daily activities beginning the next day--such as daily self-care, walking, climbing stairs--gradually increasing activities as tolerated.  You may have sexual intercourse when it is comfortable.  Refrain from any heavy lifting or straining until approved by your doctor. You may drive when you are no longer taking prescription pain medication, you can comfortably wear a seatbelt, and you can safely maneuver your car and apply brakes.  FOLLOW-UP You should see your doctor in the office for a follow-up appointment approximately 2-3 weeks after your surgery.  You should have been given your post-op/follow-up appointment when your surgery was scheduled.  If you did not receive a post-op/follow-up appointment, make sure that you call for this appointment within a day or two after you arrive home to insure a convenient appointment time.   WHEN TO CALL YOUR DOCTOR: Fever over 101.0 Inability to urinate Continued bleeding from incision.   Increased pain, redness, or drainage from the incision. Increasing abdominal pain  The clinic staff is available to answer your questions during regular business hours.  Please don't hesitate to call and ask to speak to one of the nurses for  clinical concerns.  If you have a medical emergency, go to the nearest emergency room or call 911.  A surgeon from [facility] is always on call at the hospital. [address], [address], [place], [place]  [address] ? [address] [address], [place], [place]   [address] [phone] ? [phone] ? FAX [phone] Web site: [email or web address]      Managing Your Pain After Surgery Without Opioids    Thank you for participating in our program to help patients manage their pain after surgery without opioids. This is part of our effort to provide you with the best care possible, without exposing you or your family to the risk that opioids pose.  What pain can I expect after surgery? You can expect to have some pain after surgery. This is normal. The pain is typically worse the day after surgery, and quickly begins to get better. Many studies have found that many patients are able to manage their pain after surgery with Over-the-Counter (OTC) medications such as Tylenol and Motrin. If you have a condition that does not allow you to take Tylenol or Motrin, notify your surgical team.  How will I manage my pain? The best strategy for controlling your pain after surgery is around the clock pain control with Tylenol (acetaminophen) and Motrin (ibuprofen or Advil). Alternating these medications with each other allows you to maximize your pain control. In addition to Tylenol and Motrin, you can use heating pads or ice packs on your incisions to help reduce your pain.  How will I alternate your regular strength over-the-counter pain medication? You will take a dose of pain medication every three hours. Start by taking 650 mg of Tylenol (2 pills of 325 mg) 3 hours later take 600 mg of Motrin (3 pills of 200 mg) 3 hours after taking the Motrin take 650 mg of Tylenol 3 hours after that take 600 mg of Motrin.   - 1 -  See example - if your first dose of Tylenol is at 12:00  PM   12:00 PM Tylenol 650 mg (2 pills of 325 mg)  3:00 PM Motrin 600 mg (3 pills of 200 mg)  6:00 PM Tylenol 650 mg (2 pills of 325 mg)  9:00 PM Motrin 600 mg (3 pills of 200 mg)  Continue alternating every 3 hours   We recommend that you follow this schedule around-the-clock for at least 3 days after surgery, or until you feel that it is no longer needed. Use the table on the last page of this handout to keep track of the medications you are taking. Important: Do not take more than 3000mg of Tylenol or 3200mg of Motrin in a 24-hour period. Do not take ibuprofen/Motrin if you have a history of bleeding stomach ulcers, severe kidney disease, &/or actively taking a blood thinner  What if I still have pain? If you have pain that is not controlled with the over-the-counter pain medications (Tylenol and Motrin or Advil) you might have what we call "breakthrough" pain. You will receive a prescription for a small amount of an opioid pain medication such as Oxycodone, Tramadol, or Tylenol with Codeine. Use these opioid pills in the first 24 hours after surgery if you have breakthrough pain. Do   not take more than 1 pill every 4-6 hours.  If you still have uncontrolled pain after using all opioid pills, don't hesitate to call our staff using the number provided. We will help make sure you are managing your pain in the best way possible, and if necessary, we can provide a prescription for additional pain medication.   Day 1    Time  Name of Medication Number of pills taken  Amount of Acetaminophen  Pain Level   Comments  AM PM       AM PM       AM PM       AM PM       AM PM       AM PM       AM PM       AM PM       Total Daily amount of Acetaminophen Do not take more than  3,000 mg per day      Day 2    Time  Name of Medication Number of pills taken  Amount of Acetaminophen  Pain Level   Comments  AM PM       AM PM       AM PM       AM PM       AM PM       AM PM       AM  PM       AM PM       Total Daily amount of Acetaminophen Do not take more than  3,000 mg per day      Day 3    Time  Name of Medication Number of pills taken  Amount of Acetaminophen  Pain Level   Comments  AM PM       AM PM       AM PM       AM PM          AM PM       AM PM       AM PM       AM PM       Total Daily amount of Acetaminophen Do not take more than  3,000 mg per day      Day 4    Time  Name of Medication Number of pills taken  Amount of Acetaminophen  Pain Level   Comments  AM PM       AM PM       AM PM       AM PM       AM PM       AM PM       AM PM       AM PM       Total Daily amount of Acetaminophen Do not take more than  3,000 mg per day      Day 5    Time  Name of Medication Number of pills taken  Amount of Acetaminophen  Pain Level   Comments  AM PM       AM PM       AM PM       AM PM       AM PM       AM PM       AM PM       AM PM       Total Daily amount of Acetaminophen Do not take more than    3,000 mg per day       Day 6    Time  Name of Medication Number of pills taken  Amount of Acetaminophen  Pain Level  Comments  AM PM       AM PM       AM PM       AM PM       AM PM       AM PM       AM PM       AM PM       Total Daily amount of Acetaminophen Do not take more than  3,000 mg per day      Day 7    Time  Name of Medication Number of pills taken  Amount of Acetaminophen  Pain Level   Comments  AM PM       AM PM       AM PM       AM PM       AM PM       AM PM       AM PM       AM PM       Total Daily amount of Acetaminophen Do not take more than  3,000 mg per day        For additional information about how and where to safely dispose of unused opioid medications - https://www.morepowerfulnc.org  Disclaimer: This document contains information and/or instructional materials adapted from [facility] for the typical patient with your condition. It does not replace medical advice  from your health care provider because your experience may differ from that of the typical patient. Talk to your health care provider if you have any questions about this document, your condition or your treatment plan. Adapted from [facility]  

## 2021-09-15 ENCOUNTER — Encounter (HOSPITAL_COMMUNITY): Payer: Self-pay | Admitting: General Surgery

## 2021-09-15 LAB — SURGICAL PATHOLOGY

## 2021-09-25 ENCOUNTER — Other Ambulatory Visit: Payer: Self-pay | Admitting: Family Medicine

## 2021-10-23 ENCOUNTER — Other Ambulatory Visit: Payer: Self-pay

## 2021-10-23 ENCOUNTER — Ambulatory Visit (INDEPENDENT_AMBULATORY_CARE_PROVIDER_SITE_OTHER): Payer: BC Managed Care – PPO | Admitting: Family Medicine

## 2021-10-23 ENCOUNTER — Encounter: Payer: Self-pay | Admitting: Family Medicine

## 2021-10-23 VITALS — BP 121/81 | HR 89 | Temp 97.6°F | Resp 16 | Ht 68.0 in | Wt 201.4 lb

## 2021-10-23 DIAGNOSIS — I10 Essential (primary) hypertension: Secondary | ICD-10-CM | POA: Diagnosis not present

## 2021-10-23 DIAGNOSIS — Z0000 Encounter for general adult medical examination without abnormal findings: Secondary | ICD-10-CM

## 2021-10-23 MED ORDER — LOSARTAN POTASSIUM 100 MG PO TABS: 100.0000 mg | ORAL_TABLET | Freq: Every day | ORAL | 3 refills | Status: DC

## 2021-10-23 NOTE — Progress Notes (Signed)
See medical student note for this encounter. I personally was present during the history, physical exam, and medical decision-making activities of this service and have verified that the service and findings are accurately documented in the student's note. Signed:  Santiago Bumpers, MD           10/23/2021

## 2021-10-23 NOTE — Progress Notes (Signed)
Office Note 10/23/2021  CC:  Chief Complaint  Patient presents with   Annual Exam    Pt is not fasting   HPI:  Patient is a 60 y.o. male who is here for annual health maintenance exam and f/u HTN. Since I last saw him he had acute cholecystitis and lap cholecystectomy. Patient says he feels well and denies any postprandial or RUQ pain. Patient shared motivation to control his blood sugar and lipid levels through diet if needed. ROS is negative.     Past Medical History:  Diagnosis Date   Arthritis    GERD (gastroesophageal reflux disease)    Hypercholesterolemia 08/2020   TLC->imp 12/2020   Hypertension    Obesity, Class I, BMI 30-34.9    Palpitation 04/2020   Zio patch x 3d -->occ PAC and PVC; triggered events correlate with times of PVCs.  Reassured.    Past Surgical History:  Procedure Laterality Date   CHOLECYSTECTOMY Right 09/14/2021   Procedure: LAPAROSCOPIC CHOLECYSTECTOMY WITH INTRAOPERATIVE CHOLANGIOGRAM,;  Surgeon: [name], MD;  Location: [facility];  Service: General;  Laterality: Right;   COLONOSCOPY  approx 2018   ? polyp, unclear on recall time--get records.   ESOPHAGEAL DILATION  2001   Dr. Madilyn Fireman.  Had it done twice (GERD-induced stricture)   Heart Rhythm monitoring  04/2020   Zio patch x 3d -->occ PAC and PVC; triggered events correlate with times of PVCs.  Reassured.    Family History  Problem Relation Age of Onset   Arthritis Mother    Hearing loss Mother    Heart disease Mother    Arthritis Father    Heart disease Father    High Cholesterol Father    High blood pressure Father     Social History   Socioeconomic History   Marital status: Married    Spouse name: Not on file   Number of children: Not on file   Years of education: Not on file   Highest education level: Not on file  Occupational History   Not on file  Tobacco Use   Smoking status: Former    Types: Cigarettes   Smokeless tobacco: Never  Substance and Sexual Activity    Alcohol use: Never   Drug use: Never   Sexual activity: Not on file  Other Topics Concern   Not on file  Social History Narrative   Married, several children (grown).   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   Tob: 25 pack-yr hx; quit 1997   Alc: rare beer   He leads teams on mission trips.     Social Determinants of Health   Financial Resource Strain: Not on file  Food Insecurity: Not on file  Transportation Needs: Not on file  Physical Activity: Not on file  Stress: Not on file  Social Connections: Not on file  Intimate Partner Violence: Not on file    Outpatient Medications Prior to Visit  Medication Sig Dispense Refill   Multiple Vitamin (MULTIVITAMIN ADULT PO) Take 1 tablet by mouth daily with breakfast.     NON FORMULARY Take 1 tablet by mouth See admin instructions. Zinc/Magnesium/Potassium tablets- Take 1 tablet by mouth once a day with food     Omega-3 Fatty Acids (FISH OIL PO) Take 1 capsule by mouth daily with breakfast.     omeprazole (PRILOSEC) 20 MG capsule TAKE 1 CAPSULE DAILY 90 capsule 0   Vitamin D-Vitamin K (K2 PLUS D3 PO)  Take 1 capsule by mouth daily with breakfast.     losartan (COZAAR) 100 MG tablet Take 1 tablet (100 mg total) by mouth daily. 90 tablet 3   acetaminophen (TYLENOL) 325 MG tablet Take 650 mg by mouth every 6 (six) hours as needed for mild pain or headache. (Patient not taking: Reported on 10/23/2021)     oxyCODONE (OXY IR/ROXICODONE) 5 MG immediate release tablet Take 1 tablet (5 mg total) by mouth every 4 (four) hours as needed for moderate pain or severe pain. (Patient not taking: Reported on 10/23/2021) 15 tablet 0   VITAMIN D3 SUPER STRENGTH 50 MCG (2000 UT) CAPS Take 2,000 Units by mouth daily. (Patient not taking: Reported on 10/23/2021)     No facility-administered medications prior to visit.    No Known Allergies  ROS All systems negative  PE; Vitals  with BMI 10/23/2021 09/14/2021 09/14/2021  Height 5\' 8"  - -  Weight 201 lbs 6 oz - -  BMI 30.63 - -  Systolic 121 170  Diastolic 81 94 95  Pulse 89 88 82    Pertinent labs:  Lab Results  Component Value Date   TSH 1.78 09/30/2020   Lab Results  Component Value Date   WBC 6.4 09/14/2021   HGB 14.3 09/14/2021   HCT 40.9 09/14/2021   MCV 87.6 09/14/2021   PLT 149 (L) 09/14/2021   Lab Results  Component Value Date   CREATININE 1.02 09/14/2021   BUN 17 09/14/2021   NA 135 09/14/2021   K 3.8 09/14/2021   CL 103 09/14/2021   CO2 25 09/14/2021   Lab Results  Component Value Date   ALT 547 (H) 09/14/2021   AST 175 (H) 09/14/2021   ALKPHOS 115 09/14/2021   BILITOT 5.2 (H) 09/14/2021   Lab Results  Component Value Date   CHOL 158 01/13/2021   Lab Results  Component Value Date   HDL 33.40 (L) 01/13/2021   Lab Results  Component Value Date   LDLCALC 109 (H) 01/13/2021   Lab Results  Component Value Date   TRIG 76.0 01/13/2021   Lab Results  Component Value Date   CHOLHDL 5 01/13/2021   Lab Results  Component Value Date   PSA 0.86 09/30/2020    ASSESSMENT AND PLAN:   Health maintenance exam: Reviewed age and gender appropriate health maintenance issues (prudent diet, regular exercise, health risks of tobacco and excessive alcohol, use of seatbelts, fire alarms in home, use of sunscreen).  Also reviewed age and gender appropriate health screening as well as vaccine recommendations. Vaccines: Tdap, flu, and shingrix offered; however, pt declined. Labs: None today but scheduled tomorrow (10/24) fasting HP and glucose Prostate ca screening: PSA tomorrow (10/24) . Colon ca screening: hx polyps 2018, recall 2023 ([facility]).  An After Visit Summary was printed and given to the patient.  FOLLOW UP:  Return for next visit 1 yr for cpe.  Also, needs fasting lab appt at earliest convenience.2024   Marland Kitchen (MS3) Signed:  Clarita Crane, MD            10/23/2021

## 2021-10-24 ENCOUNTER — Ambulatory Visit (INDEPENDENT_AMBULATORY_CARE_PROVIDER_SITE_OTHER): Payer: BC Managed Care – PPO

## 2021-10-24 DIAGNOSIS — Z0000 Encounter for general adult medical examination without abnormal findings: Secondary | ICD-10-CM

## 2021-10-24 LAB — CBC WITH DIFFERENTIAL/PLATELET
Basophils Absolute: 0 10*3/uL (ref 0.0–0.1)
Basophils Relative: 0.6 % (ref 0.0–3.0)
Eosinophils Absolute: 0.2 10*3/uL (ref 0.0–0.7)
Eosinophils Relative: 3.5 % (ref 0.0–5.0)
HCT: 43.3 % (ref 39.0–52.0)
Hemoglobin: 14.9 g/dL (ref 13.0–17.0)
Lymphocytes Relative: 28.5 % (ref 12.0–46.0)
Lymphs Abs: 1.6 10*3/uL (ref 0.7–4.0)
MCHC: 34.4 g/dL (ref 30.0–36.0)
MCV: 88.3 fl (ref 78.0–100.0)
Monocytes Absolute: 0.3 10*3/uL (ref 0.1–1.0)
Monocytes Relative: 5.8 % (ref 3.0–12.0)
Neutro Abs: 3.4 10*3/uL (ref 1.4–7.7)
Neutrophils Relative %: 61.6 % (ref 43.0–77.0)
Platelets: 196 10*3/uL (ref 150.0–400.0)
RBC: 4.91 Mil/uL (ref 4.22–5.81)
RDW: 12.8 % (ref 11.5–15.5)
WBC: 5.5 10*3/uL (ref 4.0–10.5)

## 2021-10-24 LAB — COMPREHENSIVE METABOLIC PANEL
ALT: 30 U/L (ref 0–53)
AST: 20 U/L (ref 0–37)
Albumin: 4.2 g/dL (ref 3.5–5.2)
Alkaline Phosphatase: 90 U/L (ref 39–117)
BUN: 13 mg/dL (ref 6–23)
CO2: 27 mEq/L (ref 19–32)
Calcium: 9.7 mg/dL (ref 8.4–10.5)
Chloride: 105 mEq/L (ref 96–112)
Creatinine, Ser: 0.9 mg/dL (ref 0.40–1.50)
GFR: 91.87 mL/min (ref >60.00)
Glucose, Bld: 100 mg/dL — ABNORMAL HIGH (ref 70–99)
Potassium: 4.5 mEq/L (ref 3.5–5.1)
Sodium: 140 mEq/L (ref 135–145)
Total Bilirubin: 0.6 mg/dL (ref 0.2–1.2)
Total Protein: 6.5 g/dL (ref 6.0–8.3)

## 2021-10-24 LAB — TSH: TSH: 1.96 u[IU]/mL (ref 0.35–5.50)

## 2021-10-24 LAB — LIPID PANEL
Cholesterol: 154 mg/dL (ref 0–200)
HDL: 34.3 mg/dL — ABNORMAL LOW (ref >39.00)
LDL Cholesterol: 98 mg/dL (ref 0–99)
NonHDL: 119.97
Total CHOL/HDL Ratio: 4
Triglycerides: 112 mg/dL (ref 0.0–149.0)
VLDL: 22.4 mg/dL (ref 0.0–40.0)

## 2021-10-24 NOTE — Progress Notes (Signed)
Per orders of Dr. Milinda Cave Pt is here for labs, pt tolerated draw well. Sw, cma

## 2021-10-25 ENCOUNTER — Other Ambulatory Visit: Payer: Self-pay

## 2021-10-25 MED ORDER — OMEPRAZOLE 20 MG PO CPDR: 20.0000 mg | DELAYED_RELEASE_CAPSULE | Freq: Every day | ORAL | 1 refills | Status: DC

## 2021-10-25 MED ORDER — LOSARTAN POTASSIUM 100 MG PO TABS: 100.0000 mg | ORAL_TABLET | Freq: Every day | ORAL | 3 refills | Status: DC

## 2021-10-25 NOTE — Addendum Note (Signed)
Addended by: Paschal Dopp on: 10/25/2021 04:06 PM   Modules accepted: Orders

## 2022-07-02 ENCOUNTER — Other Ambulatory Visit: Payer: Self-pay

## 2022-07-04 MED ORDER — OMEPRAZOLE 20 MG PO CPDR: 20.0000 mg | DELAYED_RELEASE_CAPSULE | Freq: Every day | ORAL | 1 refills | Status: DC

## 2022-07-09 ENCOUNTER — Telehealth: Payer: Self-pay

## 2022-07-09 NOTE — Telephone Encounter (Signed)
Please change preferred pharmacy to [facility].  No follow up call needed at this time.

## 2022-07-10 ENCOUNTER — Other Ambulatory Visit: Payer: Self-pay

## 2022-07-10 MED ORDER — OMEPRAZOLE 20 MG PO CPDR: 20.0000 mg | DELAYED_RELEASE_CAPSULE | Freq: Every day | ORAL | 1 refills | Status: AC

## 2022-07-10 NOTE — Telephone Encounter (Signed)
Rx resent to mail pharmacy.

## 2022-07-19 ENCOUNTER — Telehealth: Payer: Self-pay

## 2022-07-19 NOTE — Telephone Encounter (Signed)
Patient and patient wife on call regarding getting a referral to a cardiologist.  I asked patient if Dr. Milinda Cave has seen him regarding the issue.  He said "yes, I guess, yes it was last year." Patient was last seen by PCP in Oct 2022.  Patient wife stated their insurance doesn't require a referral from PCP.  I told them the cardiologist would probably want a referral from Dr. Milinda Cave.  I offered to set appt with Dr. Milinda Cave next week. They both declined setting up appt, said it was unnecessary to come for an office visit; when provider will be sending pt to cardiologist.   No follow up call needed. For documenting purposes only.

## 2022-07-25 ENCOUNTER — Ambulatory Visit (INDEPENDENT_AMBULATORY_CARE_PROVIDER_SITE_OTHER): Payer: BC Managed Care – PPO | Admitting: Family Medicine

## 2022-07-25 ENCOUNTER — Encounter: Payer: Self-pay | Admitting: Family Medicine

## 2022-07-25 VITALS — BP 122/83 | HR 76 | Temp 97.8°F | Ht 68.0 in | Wt 198.2 lb

## 2022-07-25 DIAGNOSIS — R002 Palpitations: Secondary | ICD-10-CM | POA: Diagnosis not present

## 2022-07-25 NOTE — Progress Notes (Signed)
OFFICE VISIT  07/25/2022  CC:  Chief Complaint  Patient presents with   Heart Palpitiations    Recent within the last 3-4 weeks; comes and goes. Feels flushed and heart races occasionally. Pt c/o feeling easily fatigued.    Headache    Frequent and has also been occurring for the past 3-4 weeks; takes Tylenol for relief   Patient is a 61 y.o. male who presents for palpitations.  HPI: About 3 to 4 weeks ago Todd Bailey began noticing very brief fluttering sensation in chest intermittently.  Typically lasts 5 to 10 seconds and he describes it like a "quick adrenaline rush"--primarily a flushing sensation in his face and head. Denies any recent change in food or drink.  No association with activity. While he was initially feeling them on a daily basis he notes that in the last several days he has not felt it anymore.  No exertional chest pain, no shortness of breath, no diaphoresis, no nausea, no jaw or arm pain.  No shortness of breath at rest but has noted over the last 1 to 2 years he feels more easily winded by activities. He has been having some mild headaches most days lately.  ROS as above, plus--> no fevers,no dizziness, no rashes, no melena/hematochezia.  No polyuria or polydipsia.  No myalgias or arthralgias.  No focal weakness, paresthesias, or tremors.  No acute vision or hearing abnormalities.  No dysuria or unusual/new urinary urgency or frequency.  No recent changes in lower legs. No n/v/d or abd pain.    Past Medical History:  Diagnosis Date   Arthritis    GERD (gastroesophageal reflux disease)    Hypercholesterolemia 08/2020   TLC->imp 12/2020   Hypertension    Obesity, Class I, BMI 30-34.9    Palpitation 04/2020   Zio patch x 3d -->occ PAC and PVC; triggered events correlate with times of PVCs.  Reassured.    Past Surgical History:  Procedure Laterality Date   CHOLECYSTECTOMY Right 09/14/2021   Procedure: LAPAROSCOPIC CHOLECYSTECTOMY WITH INTRAOPERATIVE CHOLANGIOGRAM,;   Surgeon: [name], MD;  Location: [facility];  Service: General;  Laterality: Right;   COLONOSCOPY  approx 2018   ? polyp, unclear on recall time--get records.   ESOPHAGEAL DILATION  2001   Dr. Madilyn Fireman.  Had it done twice (GERD-induced stricture)   Heart Rhythm monitoring  04/2020   Zio patch x 3d -->occ PAC and PVC; triggered events correlate with times of PVCs.  Reassured.    Outpatient Medications Prior to Visit  Medication Sig Dispense Refill   losartan (COZAAR) 100 MG tablet Take 1 tablet (100 mg total) by mouth daily. 90 tablet 3   Omega-3 Fatty Acids (FISH OIL PO) Take 1 capsule by mouth daily with breakfast.     omeprazole (PRILOSEC) 20 MG capsule Take 1 capsule (20 mg total) by mouth daily. 90 capsule 1   Vitamin D-Vitamin K (K2 PLUS D3 PO) Take 1 capsule by mouth daily with breakfast.     acetaminophen (TYLENOL) 325 MG tablet Take 650 mg by mouth every 6 (six) hours as needed for mild pain or headache. (Patient not taking: Reported on 10/23/2021)     NON FORMULARY Take 1 tablet by mouth See admin instructions. Zinc/Magnesium/Potassium tablets- Take 1 tablet by mouth once a day with food     Multiple Vitamin (MULTIVITAMIN ADULT PO) Take 1 tablet by mouth daily with breakfast. (Patient not taking: Reported on 07/25/2022)     No facility-administered medications prior to visit.  No Known Allergies  ROS As per HPI  PE:    07/25/2022    3:28 PM 10/23/2021    3:05 PM 09/14/2021    3:50 PM  Vitals with BMI  Height 5\' 8"  5\' 8"    Weight 198 lbs 3 oz 201 lbs 6 oz   BMI 30.14 30.63   Systolic 122 121  Diastolic 83 81 94  Pulse 76 89 88  02 sat 93% RA today  Physical Exam  Gen: Alert, well appearing.  Patient is oriented to person, place, time, and situation. AFFECT: pleasant, lucid thought and speech. : no injection, icteris, swelling, or exudate.  EOMI, PERRLA. Mouth: lips without lesion/swelling.  Oral mucosa pink and moist. Oropharynx without erythema,  exudate, or swelling.  CV: RRR, no m/r/g.   LUNGS: CTA bilat, nonlabored resps, good aeration in all lung fields. EXT: no clubbing or cyanosis.  no edema.   LABS:  Last CBC Lab Results  Component Value Date   WBC 5.5 10/23/2021   HGB 14.9 10/23/2021   HCT 43.3 10/23/2021   MCV 88.3 10/23/2021   MCH 30.6 09/14/2021   RDW 12.8 10/23/2021   PLT 196.0 10/23/2021   Last metabolic panel Lab Results  Component Value Date   GLUCOSE 100 (H) 10/23/2021   NA 140 10/23/2021   K 4.5 10/23/2021   CL 105 10/23/2021   CO2 27 10/23/2021   BUN 13 10/23/2021   CREATININE 0.90 10/23/2021   GFRNONAA >60 09/14/2021   CALCIUM 9.7 10/23/2021   PROT 6.5 10/23/2021   ALBUMIN 4.2 10/23/2021   BILITOT 0.6 10/23/2021   ALKPHOS 90 10/23/2021   AST 20 10/23/2021   ALT 30 10/23/2021   ANIONGAP 7 09/14/2021   Last thyroid functions Lab Results  Component Value Date   TSH 1.96 10/23/2021   12 lead EKG today: Sinus  Rhythm  -RSR(V1). No ST or T wave abnormality. Intervals and duration normal. No ectopy. Compared to EKG 09/12/21 no change.  IMPRESSION AND PLAN:  Palpitations. Has had similar symptoms in the past, rhythm monitoring x3 days in 2021 showed occasional PAC and PVC, symptoms correlated with PVCs. EKG today normal. Cbc,bmet, mag, tsh. Ref Dr. 09/14/21 per patient request.  An After Visit Summary was printed and given to the patient.  FOLLOW UP: Return for Follow-up to be determined based on today's lab results and his upcoming cardiology evaluation..  Signed:  2022, MD           07/25/2022

## 2022-07-26 LAB — CBC
HCT: 45.1 % (ref 39.0–52.0)
Hemoglobin: 15.4 g/dL (ref 13.0–17.0)
MCHC: 34.1 g/dL (ref 30.0–36.0)
MCV: 89.1 fl (ref 78.0–100.0)
Platelets: 216 10*3/uL (ref 150.0–400.0)
RBC: 5.06 Mil/uL (ref 4.22–5.81)
RDW: 12.8 % (ref 11.5–15.5)
WBC: 6.8 10*3/uL (ref 4.0–10.5)

## 2022-07-26 LAB — BASIC METABOLIC PANEL
BUN: 14 mg/dL (ref 6–23)
CO2: 25 mEq/L (ref 19–32)
Calcium: 9.7 mg/dL (ref 8.4–10.5)
Chloride: 104 mEq/L (ref 96–112)
Creatinine, Ser: 1.06 mg/dL (ref 0.40–1.50)
GFR: 75.09 mL/min (ref >60.00)
Glucose, Bld: 97 mg/dL (ref 70–99)
Potassium: 4.2 mEq/L (ref 3.5–5.1)
Sodium: 138 mEq/L (ref 135–145)

## 2022-07-26 LAB — MAGNESIUM: Magnesium: 2.1 mg/dL (ref 1.5–2.5)

## 2022-07-26 LAB — TSH: TSH: 2.17 u[IU]/mL (ref 0.35–5.50)

## 2022-07-31 DIAGNOSIS — R931 Abnormal findings on diagnostic imaging of heart and coronary circulation: Secondary | ICD-10-CM

## 2022-07-31 HISTORY — DX: Abnormal findings on diagnostic imaging of heart and coronary circulation: R93.1

## 2022-08-16 ENCOUNTER — Ambulatory Visit: Payer: BC Managed Care – PPO | Admitting: Cardiology

## 2022-08-16 ENCOUNTER — Encounter: Payer: Self-pay | Admitting: Cardiology

## 2022-08-16 VITALS — BP 153/89 | HR 62 | Temp 98.1°F | Resp 16 | Ht 68.0 in | Wt 204.6 lb

## 2022-08-16 DIAGNOSIS — R0609 Other forms of dyspnea: Secondary | ICD-10-CM | POA: Diagnosis not present

## 2022-08-16 DIAGNOSIS — R002 Palpitations: Secondary | ICD-10-CM

## 2022-08-16 DIAGNOSIS — I10 Essential (primary) hypertension: Secondary | ICD-10-CM

## 2022-08-16 DIAGNOSIS — I2510 Atherosclerotic heart disease of native coronary artery without angina pectoris: Secondary | ICD-10-CM

## 2022-08-16 DIAGNOSIS — R931 Abnormal findings on diagnostic imaging of heart and coronary circulation: Secondary | ICD-10-CM

## 2022-08-16 DIAGNOSIS — K449 Diaphragmatic hernia without obstruction or gangrene: Secondary | ICD-10-CM

## 2022-08-16 DIAGNOSIS — R072 Precordial pain: Secondary | ICD-10-CM

## 2022-08-16 NOTE — Progress Notes (Addendum)
Primary Physician/Referring:  Tammi Sou, MD  Patient ID: Todd Bailey, male    DOB: 20-Nov-1961, 61 y.o.   MRN: [id number]  Chief Complaint  Patient presents with   Palpitations   New Patient (Initial Visit)   HPI:    Todd Bailey  is a 61 y.o. Caucasian male patient with chronic palpitations, previous outpatient extended EKG monitoring it revealed PVCs correlating with palpitations, hypertension, hypercholesterolemia, esophageal stricture SP dilatation in 2001 referred to me for evaluation of palpitations, chest tightness and also dyspnea on exertion.  Patient has been having palpitations and also some chest pain over the past 5 to 6 weeks, described as skipped beats and sometimes feels fullness with palpitations in his neck and short of breath when it happens.  It is very transient.  He also has mild dyspnea on exertion when he climbs up a flight of stairs that started about 2 years ago and he thinks he he may have had COVID.  Past Medical History:  Diagnosis Date   Arthritis    GERD (gastroesophageal reflux disease)    Hypercholesterolemia 08/2020   TLC->imp 12/2020   Hypertension    Obesity, Class I, BMI 30-34.9    Palpitation 04/2020   Zio patch x 3d -->occ PAC and PVC; triggered events correlate with times of PVCs.  Reassured.   Past Surgical History:  Procedure Laterality Date   CHOLECYSTECTOMY Right 09/14/2021   Procedure: LAPAROSCOPIC CHOLECYSTECTOMY WITH INTRAOPERATIVE CHOLANGIOGRAM,;  Surgeon: Greer Pickerel, MD;  Location: [facility];  Service: General;  Laterality: Right;   COLONOSCOPY  approx 2018   ? polyp, unclear on recall time--get records.   ESOPHAGEAL DILATION  2001   Dr. Amedeo Plenty.  Had it done twice (GERD-induced stricture)   Heart Rhythm monitoring  04/2020   Zio patch x 3d -->occ PAC and PVC; triggered events correlate with times of PVCs.  Reassured.   Family History  Problem Relation Age of Onset   Arthritis Mother    Hearing loss Mother     Heart disease Mother    Arthritis Father    Heart disease Father    High Cholesterol Father    High blood pressure Father     Social History   Tobacco Use   Smoking status: Former    Packs/day: 1.00    Years: 15.00    Total pack years: 15.00    Types: Cigarettes    Quit date: 2001    Years since quitting: 22.6   Smokeless tobacco: Never  Substance Use Topics   Alcohol use: Never   Marital Status: Married  ROS  Review of Systems  Cardiovascular:  Positive for chest pain and dyspnea on exertion. Negative for leg swelling.   Objective      08/16/2022    9:21 AM 07/25/2022    3:28 PM 10/23/2021    3:05 PM  Vitals with BMI  Height _0  _1  _2   Weight 204 lbs 10 oz 198 lbs 3 oz 201 lbs 6 oz  BMI 31.12 43.56 86.16  Systolic 837 290 211  Diastolic 89 83 81  Pulse 62 76 89   Today's Vitals   08/16/22 0921  BP: (!) 153/89  Pulse: 62  Resp: 16  Temp: 98.1 F (36.7 C)  TempSrc: Temporal  SpO2: 96%  Weight: 204 lb 9.6 oz (92.8 kg)  Height: _3  (1.727 m)   Body mass index is 31.11 kg/m.   Physical Exam Constitutional:  Appearance: He is obese.  Neck:     Vascular: No carotid bruit or JVD.  Cardiovascular:     Rate and Rhythm: Normal rate and regular rhythm.     Pulses: Intact distal pulses.     Heart sounds: Normal heart sounds. No murmur heard.    No gallop.  Pulmonary:     Effort: Pulmonary effort is normal.     Breath sounds: Normal breath sounds.  Abdominal:     General: Bowel sounds are normal.     Palpations: Abdomen is soft.  Musculoskeletal:     Right lower leg: No edema.     Left lower leg: No edema.     Medications and allergies  No Known Allergies   Medication list after today's encounter   Current Outpatient Medications:    acetaminophen (TYLENOL) 325 MG tablet, Take 650 mg by mouth every 6 (six) hours as needed for mild pain or headache., Disp: , Rfl:    losartan (COZAAR) 100 MG tablet, Take 1 tablet (100 mg total) by mouth  daily., Disp: 90 tablet, Rfl: 3   Omega-3 Fatty Acids (FISH OIL PO), Take 1 capsule by mouth daily with breakfast., Disp: , Rfl:    omeprazole (PRILOSEC) 20 MG capsule, Take 1 capsule (20 mg total) by mouth daily., Disp: 90 capsule, Rfl: 1   Vitamin D-Vitamin K (K2 PLUS D3 PO), Take 1 capsule by mouth daily with breakfast., Disp: , Rfl:   Laboratory examination:   Lab Results  Component Value Date   NA 138 07/25/2022   K 4.2 07/25/2022   CO2 25 07/25/2022   GLUCOSE 97 07/25/2022   BUN 14 07/25/2022   CREATININE 1.06 07/25/2022   CALCIUM 9.7 07/25/2022   GFRNONAA >60 09/14/2021       Latest Ref Rng & Units 07/25/2022    4:09 PM 10/23/2021    3:45 PM 09/14/2021    4:27 AM  BMP  Glucose 70 - 99 mg/dL 97  100  111   BUN 6 - 23 mg/dL _0 Creatinine 0.40 - 1.50 mg/dL 1.06  0.90  1.02   Sodium 135 - 145 mEq/L 138  140  135   Potassium 3.5 - 5.1 mEq/L 4.2  4.5  3.8   Chloride 96 - 112 mEq/L 104  105  103   CO2 19 - 32 mEq/L _1 Calcium 8.4 - 10.5 mg/dL 9.7  9.7  8.9       Latest Ref Rng & Units 10/23/2021    3:45 PM 09/14/2021    4:27 AM 09/13/2021    3:57 AM  Hepatic Function  Total Protein 6.0 - 8.3 g/dL 6.5  6.6  6.8   Albumin 3.5 - 5.2 g/dL 4.2  3.4  3.7   AST 0 - 37 U/L 20  175  466   ALT 0 - 53 U/L 30  547  851   Alk Phosphatase 39 - 117 U/L 90  115  122   Total Bilirubin 0.2 - 1.2 mg/dL 0.6  5.2  6.5   Bilirubin, Direct 0.0 - 0.2 mg/dL  3.5  4.0     Lipid Panel Recent Labs    10/23/21 1545  CHOL 154  TRIG 112.0  LDLCALC 98  VLDL 22.4  HDL 34.30*  CHOLHDL 4    HEMOGLOBIN A1C No results found for: "HGBA1C", "MPG" TSH Recent Labs    10/23/21 1545 07/25/22 1609  TSH 1.96 2.17  Radiology:    Cardiac Studies:   Outpatient Zio patch 2 weeks for 07/19/2020: Predominant rhythm is normal sinus rhythm.  Occasional isolated PVCs and PACs.  Triggered events correlated with PVCs.  EKG:   EKG 08/16/2022: Normal sinus rhythm at rate of 60  beats 1, normal axis.  Incomplete right bundle branch block.  No evidence of ischemia, normal EKG.    Assessment     ICD-10-CM   1. Palpitations  R00.2 EKG 12-Lead    2. Precordial pain  R07.2 PCV ECHOCARDIOGRAM COMPLETE    PCV CARDIAC STRESS TEST    CT CARDIAC SCORING ([facility] LOCATIONS ONLY)    PCV MYOCARDIAL PERFUSION WO LEXISCAN    3. Dyspnea on exertion  R06.09 PCV ECHOCARDIOGRAM COMPLETE    PCV CARDIAC STRESS TEST    4. Hiatal hernia  K44.9     5. Primary hypertension  I10     6. Coronary artery disease due to calcified coronary lesion  I25.10 PCV MYOCARDIAL PERFUSION WO LEXISCAN   I25.84     7. Elevated coronary artery calcium score 08/27/2022: Total Agatston Score: 1147. MESA database percentile: 96  R93.1 PCV MYOCARDIAL PERFUSION WO LEXISCAN       Orders Placed This Encounter  Procedures   CT CARDIAC SCORING ([facility] LOCATIONS ONLY)    204 / no spinal stimulator, body injector, glucose or heart monitor / no heart sx's / no needs / bcbs anthem 08/22/2022 pt is aware no caffine 24 hours prior, includes chocolate, no heavy or strenuous exercise 6 hrs prior/ miriam Pt is aware of $75 no show fee. Epic order/ miriam w pt    Standing Status:   Future    Number of Occurrences:   1    Standing Expiration Date:   10/16/2022    Order Specific Question:   Preferred imaging location?    Answer:   [facility]   PCV CARDIAC STRESS TEST    Standing Status:   Future    Standing Expiration Date:   10/16/2022   PCV MYOCARDIAL PERFUSION WO LEXISCAN    Standing Status:   Future    Standing Expiration Date:   10/27/2022   EKG 12-Lead   PCV ECHOCARDIOGRAM COMPLETE    Standing Status:   Future    Standing Expiration Date:   08/17/2023    No orders of the defined types were placed in this encounter.   Medications Discontinued During This Encounter  Medication Reason   NON FORMULARY      Recommendations:   Todd Bailey is a 61 y.o.  Caucasian male patient with chronic palpitations,  previous outpatient extended EKG monitoring it revealed PVCs correlating with palpitations, hypertension, hypercholesterolemia, esophageal stricture SP dilatation in 2001 referred to me for evaluation of palpitations, chest tightness and also dyspnea on exertion.  Patient has been having palpitations and also some chest pain over the past 5 to 6 weeks, described as skipped beats and sometimes feels fullness with palpitations in his neck and short of breath when it happens.  It is very transient.  He also has mild dyspnea on exertion when he climbs up a flight of stairs that started about 2 years ago and he thinks he he may have had COVID.  Chest pain is described as "discomfort" across the chest and is present continuously and very mild and unrelated to any form of exertional activity.  No other associated symptoms.  I reviewed his Zio patch from previous, clearly his symptoms of palpitations correlated with PACs or  PVCs.  Do not suspect atrial fibrillation or SVT.  And there is no sudden cardiac death history in the family.  With regard to hypertension, he has noticed his blood pressure to be elevated at home, I would have preferred him to be on verapamil 180 mg daily both for hypertension and palpitations however patient would like to wait for now.  I suspect if he were to lose 10 to 15 pounds in weight and reduce his waist size from 34-33, his lipids will improve and his blood pressure will also improve.  In view of his prior history of tobacco use at least 15 to 20-pack-year history of cigarette use, I will also obtain coronary calcium score for further cardiac risk stratification in view of dyslipidemia with reduced HDL.  We will set him up for a echocardiogram and also routine treadmill exercise stress test and I will like to see him back in 6 to 8 weeks for follow-up and I will make further and final recommendations.    Adrian Prows, MD, [facility] 08/27/2022, 9:40 PM Office: [phone]

## 2022-08-27 ENCOUNTER — Ambulatory Visit
Admission: RE | Admit: 2022-08-27 | Discharge: 2022-08-27 | Disposition: A | Discharge status: Home / Self Care | Payer: BC Managed Care – PPO | Source: Ambulatory Visit | Attending: Cardiology | Admitting: Cardiology

## 2022-08-27 DIAGNOSIS — R072 Precordial pain: Secondary | ICD-10-CM

## 2022-08-27 DIAGNOSIS — Z8249 Family history of ischemic heart disease and other diseases of the circulatory system: Secondary | ICD-10-CM | POA: Diagnosis not present

## 2022-08-27 DIAGNOSIS — Z136 Encounter for screening for cardiovascular disorders: Secondary | ICD-10-CM | POA: Diagnosis not present

## 2022-08-27 NOTE — Progress Notes (Signed)
Coronary calcium score 08/27/2022:  Total Agatston Score: 1147. MESA database percentile: 96 M164 LAD 541 LCx 212 RCA 230. Ascending and descending aortic measurements are normal. Multiple pulmonary nodules, most severe 8 mm in the right lower lobe and posterior left lower lobe..  If patient high risk for malignancy.  Recommend additional noncontrast chest CT.

## 2022-08-27 NOTE — Addendum Note (Signed)
Addended by: Delrae Rend on: 08/27/2022 09:41 PM   Modules accepted: Orders

## 2022-08-28 ENCOUNTER — Encounter: Payer: Self-pay | Admitting: Family Medicine

## 2022-09-04 ENCOUNTER — Encounter: Payer: Self-pay | Admitting: Family Medicine

## 2022-09-24 ENCOUNTER — Ambulatory Visit: Payer: BC Managed Care – PPO

## 2022-09-24 DIAGNOSIS — R931 Abnormal findings on diagnostic imaging of heart and coronary circulation: Secondary | ICD-10-CM | POA: Diagnosis not present

## 2022-09-24 DIAGNOSIS — R072 Precordial pain: Secondary | ICD-10-CM | POA: Diagnosis not present

## 2022-09-24 DIAGNOSIS — I2510 Atherosclerotic heart disease of native coronary artery without angina pectoris: Secondary | ICD-10-CM | POA: Diagnosis not present

## 2022-09-24 DIAGNOSIS — I2584 Coronary atherosclerosis due to calcified coronary lesion: Secondary | ICD-10-CM | POA: Diagnosis not present

## 2022-09-26 ENCOUNTER — Ambulatory Visit (INDEPENDENT_AMBULATORY_CARE_PROVIDER_SITE_OTHER): Payer: BC Managed Care – PPO

## 2022-09-26 ENCOUNTER — Ambulatory Visit (INDEPENDENT_AMBULATORY_CARE_PROVIDER_SITE_OTHER): Payer: BC Managed Care – PPO | Admitting: Orthopedic Surgery

## 2022-09-26 DIAGNOSIS — M25512 Pain in left shoulder: Secondary | ICD-10-CM | POA: Diagnosis not present

## 2022-09-27 ENCOUNTER — Encounter: Payer: Self-pay | Admitting: Orthopedic Surgery

## 2022-09-27 NOTE — Progress Notes (Signed)
Office Visit Note   Patient: Todd Bailey           Date of Birth: 07-04-1961           MRN: [id number] Visit Date: 09/26/2022 Requested by: Jeoffrey Massed, MD [address] [place],  [place] [address] PCP: Jeoffrey Massed, MD  Subjective: Chief Complaint  Patient presents with   Left Shoulder - Pain    HPI: Todd Bailey is a 61 year old patient with left shoulder pain of 4 months duration.  Denies any history of injury.  Does report relatively acute onset however of the symptoms.  He is right-hand dominant.  Pain wakes him from sleep.  Denies any radiation no numbness and tingling and no neck pain.  Denies any decreased range of motion and states that any range of motion does have is painful anteriorly.  No prior surgery on the left shoulder.  He works as a [employer] social research officer.  Pain he localizes is deep to the coracoid along the way of the anterior glenoid.  Denies any weakness.  Denies any superior pain and no pain in the scapular region.  Does not take medication on a regular basis for this.  May be retiring at the end of the year.              ROS: All systems reviewed are negative as they relate to the chief complaint within the history of present illness.  Patient denies  fevers or chills.   Assessment & Plan: Visit Diagnoses:  1. Left shoulder pain, unspecified chronicity     Plan: Impression is left shoulder pain unclear etiology.  Could be related to subscap partial tearing versus biceps tendinosis and/or instability.  Coracoid is unremarkable on ultrasound examination as his knee subscapularis.  There is a little bit of tendinosis within the substance of the subscap attachment but no discrete full-thickness tear visualized.  Supraspinatus also appears intact.  Plan at this time is MRI arthrogram left shoulder to evaluate this anterior pain of the coracoid.  Follow-up after that study.  4 months of symptoms with night pain as well as pain refractory to nonoperative  management.  Follow-Up Instructions: No follow-ups on file.   Orders:  Orders Placed This Encounter  Procedures   XR Shoulder Left   MR Shoulder Left w/ contrast   Arthrogram   No orders of the defined types were placed in this encounter.     Procedures: No procedures performed   Clinical Data: No additional findings.  Objective: Vital Signs: There were no vitals taken for this visit.  Physical Exam:   Constitutional: Patient appears well-developed HEENT:  Head: Normocephalic Eyes:EOM are normal Neck: Normal range of motion Cardiovascular: Normal rate Pulmonary/chest: Effort normal Neurologic: Patient is alert Skin: Skin is warm Psychiatric: Patient has normal mood and affect   Ortho Exam: Ortho exam demonstrates full active and passive range of motion of the cervical spine.  Left shoulder also has range of motion of 60/100/175 symmetric with the right-hand side.  Rotator cuff strength intact infraspinatusand subscap muscle testing.  No discrete AC joint tenderness to direct palpation.  Negative apprehension relocation testing.  Negative O'Brien's testing.  Negative speeds testing.  Mild tenderness around the coracoid to direct palpation but no masses palpable  Specialty Comments:  No specialty comments available.  Imaging: XR Shoulder Left  Result Date: 09/27/2022 AP axillary outlet radiographs left shoulder reviewed.  No acute fracture.  No glenohumeral joint or AC joint arthritis.  Acromiohumeral distance maintained.  Visualized lung fields clear.    PMFS History: Patient Active Problem List   Diagnosis Date Noted   Cholelithiasis 09/12/2021   Cholecystitis 09/12/2021   H/O adenomatous polyp of colon 04/05/2016   De Quervain's tenosynovitis, right 12/27/2014   Arthritis 12/06/2014   Essential hypertension 12/06/2014   GERD (gastroesophageal reflux disease) 12/06/2014   Hyperlipemia 12/06/2014   Prediabetes 12/06/2014   Past Medical History:   Diagnosis Date   Arthritis    Elevated coronary artery calcium score 07/2022   96th %'tile   GERD (gastroesophageal reflux disease)    Hypercholesterolemia 08/2020   TLC->imp 12/2020   Hypertension    Obesity, Class I, BMI 30-34.9    Palpitation 04/2020   Zio patch x 3d -->occ PAC and PVC; triggered events correlate with times of PVCs.  Reassured.   Pulmonary nodules    on coronary ca score CT 08/28/22-->f/u CT 3-6 mo (Dr. Einar Gip)    Family History  Problem Relation Age of Onset   Arthritis Mother    Hearing loss Mother    Heart disease Mother    Arthritis Father    Heart disease Father    High Cholesterol Father    High blood pressure Father     Past Surgical History:  Procedure Laterality Date   CHOLECYSTECTOMY Right 09/14/2021   Procedure: LAPAROSCOPIC CHOLECYSTECTOMY WITH INTRAOPERATIVE CHOLANGIOGRAM,;  Surgeon: Greer Pickerel, MD;  Location: [facility];  Service: General;  Laterality: Right;   COLONOSCOPY  approx 2018   ? polyp, unclear on recall time--get records.   coron calcium score     08/27/22: 96th%'tile (Dr. Einar Gip)   ESOPHAGEAL DILATION  2001   Dr. Amedeo Plenty.  Had it done twice (GERD-induced stricture)   Heart Rhythm monitoring  04/2020   Zio patch x 3d -->occ PAC and PVC; triggered events correlate with times of PVCs.  Reassured.   Social History   Occupational History   Not on file  Tobacco Use   Smoking status: Former    Packs/day: 1.00    Years: 15.00    Total pack years: 15.00    Types: Cigarettes    Quit date: 2001    Years since quitting: 22.7   Smokeless tobacco: Never  Vaping Use   Vaping Use: Never used  Substance and Sexual Activity   Alcohol use: Never   Drug use: Never   Sexual activity: Not on file

## 2022-09-28 ENCOUNTER — Ambulatory Visit: Payer: BC Managed Care – PPO

## 2022-09-28 DIAGNOSIS — R0609 Other forms of dyspnea: Secondary | ICD-10-CM

## 2022-09-28 DIAGNOSIS — R072 Precordial pain: Secondary | ICD-10-CM

## 2022-09-28 HISTORY — PX: TRANSTHORACIC ECHOCARDIOGRAM: SHX275

## 2022-10-02 ENCOUNTER — Encounter: Payer: Self-pay | Admitting: Family Medicine

## 2022-10-15 ENCOUNTER — Ambulatory Visit
Admission: RE | Admit: 2022-10-15 | Discharge: 2022-10-15 | Disposition: A | Discharge status: Home / Self Care | Payer: BC Managed Care – PPO | Source: Ambulatory Visit | Attending: Orthopedic Surgery | Admitting: Orthopedic Surgery

## 2022-10-15 DIAGNOSIS — M25512 Pain in left shoulder: Secondary | ICD-10-CM

## 2022-10-15 DIAGNOSIS — S46012A Strain of muscle(s) and tendon(s) of the rotator cuff of left shoulder, initial encounter: Secondary | ICD-10-CM | POA: Diagnosis not present

## 2022-10-15 MED ORDER — IOPAMIDOL (ISOVUE-M 200) INJECTION 41%
12.0000 mL | Freq: Once | INTRAMUSCULAR | Status: AC
  Administered 2022-10-15: 12 mL via INTRA_ARTICULAR

## 2022-10-17 ENCOUNTER — Ambulatory Visit (INDEPENDENT_AMBULATORY_CARE_PROVIDER_SITE_OTHER): Payer: BC Managed Care – PPO | Admitting: Orthopedic Surgery

## 2022-10-17 ENCOUNTER — Encounter: Payer: Self-pay | Admitting: Cardiology

## 2022-10-17 ENCOUNTER — Ambulatory Visit: Payer: Self-pay

## 2022-10-17 ENCOUNTER — Ambulatory Visit: Payer: BC Managed Care – PPO | Admitting: Cardiology

## 2022-10-17 VITALS — BP 144/90 | HR 69 | Temp 97.2°F | Resp 16 | Ht 68.0 in | Wt 193.0 lb

## 2022-10-17 DIAGNOSIS — I10 Essential (primary) hypertension: Secondary | ICD-10-CM

## 2022-10-17 DIAGNOSIS — R072 Precordial pain: Secondary | ICD-10-CM

## 2022-10-17 DIAGNOSIS — M65812 Other synovitis and tenosynovitis, left shoulder: Secondary | ICD-10-CM | POA: Diagnosis not present

## 2022-10-17 DIAGNOSIS — R931 Abnormal findings on diagnostic imaging of heart and coronary circulation: Secondary | ICD-10-CM

## 2022-10-17 DIAGNOSIS — M25512 Pain in left shoulder: Secondary | ICD-10-CM | POA: Diagnosis not present

## 2022-10-17 DIAGNOSIS — I2510 Atherosclerotic heart disease of native coronary artery without angina pectoris: Secondary | ICD-10-CM | POA: Diagnosis not present

## 2022-10-17 MED ORDER — NITROGLYCERIN 0.4 MG SL SUBL: 0.4000 mg | SUBLINGUAL_TABLET | SUBLINGUAL | 3 refills | Status: AC | PRN

## 2022-10-17 MED ORDER — ATORVASTATIN CALCIUM 80 MG PO TABS: 80.0000 mg | ORAL_TABLET | Freq: Every day | ORAL | 2 refills | Status: DC

## 2022-10-17 MED ORDER — METOPROLOL SUCCINATE ER 50 MG PO TB24: 50.0000 mg | ORAL_TABLET | Freq: Every day | ORAL | 2 refills | Status: DC

## 2022-10-17 NOTE — H&P (View-Only) (Signed)
Primary Physician/Referring:  Tammi Sou, MD  Patient ID: Stasia Cavalier, male    DOB: 1961-06-07, 61 y.o.   MRN: [id number]  Chief Complaint  Patient presents with   Chest Pain   Palpitations   HPI:    TYNELL WINCHELL  is a 61 y.o. Caucasian male patient with chronic palpitations, previous outpatient extended EKG monitoring it revealed PVCs correlating with palpitations, hypertension, hypercholesterolemia, esophageal stricture SP dilatation in 2001 presents for follow-up of palpitations, chest tightness and also dyspnea on exertion.  Patient has been having palpitations over the past 2 months, described as skipped beats and sometimes feels fullness with palpitations in his neck and short of breath when it happens.  It is very transient.  He also has mild dyspnea on exertion when he climbs up a flight of stairs that started about 2 years ago.  Occasionally has chest fullness when he has skipped beats.  Past Medical History:  Diagnosis Date   Arthritis    Elevated coronary artery calcium score 07/2022   96th %'tile   GERD (gastroesophageal reflux disease)    Hypercholesterolemia 08/2020   TLC->imp 12/2020   Hypertension    Obesity, Class I, BMI 30-34.9    Palpitation 04/2020   Zio patch x 3d -->occ PAC and PVC; triggered events correlate with times of PVCs.  Reassured.   Pulmonary nodules    on coronary ca score CT 08/28/22-->f/u CT 3-6 mo (Dr. Einar Gip)   Past Surgical History:  Procedure Laterality Date   CHOLECYSTECTOMY Right 09/14/2021   Procedure: LAPAROSCOPIC CHOLECYSTECTOMY WITH INTRAOPERATIVE CHOLANGIOGRAM,;  Surgeon: Greer Pickerel, MD;  Location: [facility];  Service: General;  Laterality: Right;   COLONOSCOPY  approx 2018   ? polyp, unclear on recall time--get records.   coron calcium score     08/27/22: 96th%'tile (Dr. Einar Gip)   ESOPHAGEAL DILATION  2001   Dr. Amedeo Plenty.  Had it done twice (GERD-induced stricture)   Heart Rhythm monitoring  04/2020   Zio patch x 3d  -->occ PAC and PVC; triggered events correlate with times of PVCs.  Reassured.   TRANSTHORACIC ECHOCARDIOGRAM  09/28/2022   normal   Family History  Problem Relation Age of Onset   Arthritis Mother    Hearing loss Mother    Heart disease Mother    Arthritis Father    Heart disease Father    High Cholesterol Father    High blood pressure Father     Social History   Tobacco Use   Smoking status: Former    Packs/day: 1.00    Years: 15.00    Total pack years: 15.00    Types: Cigarettes    Quit date: 2001    Years since quitting: 22.8   Smokeless tobacco: Never  Substance Use Topics   Alcohol use: Never   Marital Status: Married  ROS  Review of Systems  Cardiovascular:  Positive for chest pain, dyspnea on exertion and palpitations. Negative for leg swelling.   Objective      10/17/2022    1:58 PM 08/16/2022    9:21 AM 07/25/2022    3:28 PM  Vitals with BMI  Height _0  _1  _2   Weight 193 lbs 204 lbs 10 oz 198 lbs 3 oz  BMI 29.35 57.26 20.35  Systolic 597 416 384  Diastolic 90 89 83  Pulse 69 62 76   Today's Vitals   10/17/22 1358  BP: (!) 144/90  Pulse: 69  Resp: 16  Temp: (!)  97.2 F (36.2 C)  TempSrc: Oral  SpO2: 95%  Weight: 193 lb (87.5 kg)  Height: _0  (1.727 m)   Body mass index is 29.35 kg/m.   Physical Exam Neck:     Vascular: No carotid bruit or JVD.  Cardiovascular:     Rate and Rhythm: Normal rate and regular rhythm.     Pulses: Intact distal pulses.     Heart sounds: Normal heart sounds. No murmur heard.    No gallop.  Pulmonary:     Effort: Pulmonary effort is normal.     Breath sounds: Normal breath sounds.  Abdominal:     General: Bowel sounds are normal.     Palpations: Abdomen is soft.  Musculoskeletal:     Right lower leg: No edema.     Left lower leg: No edema.    Medications and allergies  No Known Allergies   Medication list after today's encounter   Current Outpatient Medications:    acetaminophen (TYLENOL)  325 MG tablet, Take 650 mg by mouth every 6 (six) hours as needed for mild pain or headache., Disp: , Rfl:    aspirin 81 MG chewable tablet, Chew 81 mg by mouth every evening., Disp: , Rfl:    atorvastatin (LIPITOR) 80 MG tablet, Take 1 tablet (80 mg total) by mouth daily. (Patient taking differently: Take 80 mg by mouth daily with lunch.), Disp: 30 tablet, Rfl: 2   losartan (COZAAR) 100 MG tablet, Take 1 tablet (100 mg total) by mouth daily. (Patient taking differently: Take 100 mg by mouth every evening.), Disp: 90 tablet, Rfl: 3   metoprolol succinate (TOPROL-XL) 50 MG 24 hr tablet, Take 1 tablet (50 mg total) by mouth daily. Take with or immediately following a meal. (Patient taking differently: Take 50 mg by mouth daily with lunch. Take with or immediately following a meal.), Disp: 30 tablet, Rfl: 2   nitroGLYCERIN (NITROSTAT) 0.4 MG SL tablet, Place 1 tablet (0.4 mg total) under the tongue every 5 (five) minutes as needed for up to 25 days for chest pain., Disp: 25 tablet, Rfl: 3   Omega-3 Fatty Acids (FISH OIL PO), Take 1,000 mg by mouth every evening., Disp: , Rfl:    omeprazole (PRILOSEC) 20 MG capsule, Take 1 capsule (20 mg total) by mouth daily. (Patient taking differently: Take 20 mg by mouth every evening.), Disp: 90 capsule, Rfl: 1   Cholecalciferol (VITAMIN D3 PO), Take 1 tablet by mouth every evening., Disp: , Rfl:   Laboratory examination:   Last metabolic panel Lab Results  Component Value Date   GLUCOSE 125 (H) 10/18/2022   NA 139 10/18/2022   K 5.0 10/18/2022   CL 103 10/18/2022   CO2 22 10/18/2022   BUN 15 10/18/2022   CREATININE 0.94 10/18/2022   EGFR 92 10/18/2022   CALCIUM 9.8 10/18/2022   PROT 6.5 10/23/2021   ALBUMIN 4.2 10/23/2021   BILITOT 0.6 10/23/2021   ALKPHOS 90 10/23/2021   AST 20 10/23/2021   ALT 30 10/23/2021   ANIONGAP 7 09/14/2021   Lipid Panel Recent Labs    10/23/21 1545  CHOL 154  TRIG 112.0  LDLCALC 98  VLDL 22.4  HDL 34.30*  CHOLHDL  4   Apolipo. B/A-1 Ratio 10/23/2022 0.0 - 0.7 ratio 0.8 High    Lipoprotein (a) 10/23/2022 <75.0 nmol/L <8.4    HEMOGLOBIN A1C No results found for: "HGBA1C", "MPG" TSH Recent Labs    10/23/21 1545 07/25/22 1609  TSH 1.96 2.17  Radiology:    Cardiac Studies:   Outpatient Zio patch 2 weeks for 07/19/2020: Predominant rhythm is normal sinus rhythm.  Occasional isolated PVCs and PACs.  Triggered events correlated with PVCs.  Coronary calcium score 08/27/2022:  Total Agatston Score: 1147. MESA database percentile: 96 M164 LAD 541 LCx 212 RCA 230. Ascending and descending aortic measurements are normal. Multiple pulmonary nodules, most severe 8 mm in the right lower lobe and posterior left lower lobe..  If patient high risk for malignancy.  Recommend additional noncontrast chest CT.  PCV MYOCARDIAL PERFUSION WO LEXISCAN 09/24/2022  Narrative Exercise Sestamibi stress test 09/24/2022: Exercise nuclear stress test was performed using Bruce protocol. Patient reached 8.8 METS, and 87% of age predicted maximum heart rate. Exercise capacity was good. No chest pain reported. Normal heart rate. Resting hypertension 160/90 mmHg, with normal blood pressure response. Stress EKG revealed no ischemic changes. Normal myocardial perfusion. Normal wall motion and myocardial thickening. Stress LVEF calculated 41%, but visually appears normal. Intermediate risk stud due to reduced LVEF.  PCV ECHOCARDIOGRAM COMPLETE 09/28/2022  Normal LV systolic function with visual EF 60-65%. Left ventricle cavity is normal in size. Normal left ventricular wall thickness. Normal global wall motion. Normal diastolic filling pattern, normal LAP. No significant valvular abnormalities. No prior study for comparison.     EKG:   EKG 08/16/2022: Normal sinus rhythm at rate of 60 beats 1, normal axis.  Incomplete right bundle branch block.  No evidence of ischemia, normal EKG.    Assessment     ICD-10-CM   1.  Coronary artery disease due to calcified coronary lesion  Y70.62 Basic metabolic panel   B76.28 CBC    Lipoprotein A (LPA)    Apo A1 + B + Ratio    High sensitivity CRP    nitroGLYCERIN (NITROSTAT) 0.4 MG SL tablet    2. Precordial pain  R07.2     3. Elevated coronary artery calcium score 08/27/2022: Total Agatston Score: 1147. MESA database percentile: 96  R93.1     4. Primary hypertension  I10        Orders Placed This Encounter  Procedures   Basic metabolic panel   CBC   Lipoprotein A (LPA)   Apo A1 + B + Ratio   High sensitivity CRP   Meds ordered this encounter  Medications   metoprolol succinate (TOPROL-XL) 50 MG 24 hr tablet    Sig: Take 1 tablet (50 mg total) by mouth daily. Take with or immediately following a meal.    Dispense:  30 tablet    Refill:  2   atorvastatin (LIPITOR) 80 MG tablet    Sig: Take 1 tablet (80 mg total) by mouth daily.    Dispense:  30 tablet    Refill:  2   nitroGLYCERIN (NITROSTAT) 0.4 MG SL tablet    Sig: Place 1 tablet (0.4 mg total) under the tongue every 5 (five) minutes as needed for up to 25 days for chest pain.    Dispense:  25 tablet    Refill:  3    There are no discontinued medications.    Recommendations:   VIHAAN GLOSS is a 61 y.o.  Caucasian male patient with chronic palpitations, previous outpatient extended EKG monitoring it revealed PVCs correlating with palpitations, hypertension, hypercholesterolemia, esophageal stricture SP dilatation in 2001 presents for follow-up of palpitations, chest tightness and also dyspnea on exertion.  Patient underwent coronary calcium scoring which revealed him to be extremely high risk category with score greater  than thousand placing event greater than 90th percentile.  Reviewed the nuclear stress test, revealing decreased LVEF and may indicate multivessel disease.  I have recommended proceeding with cardiac catheterization to evaluate for left main disease, proximal LAD or large  vessel proximal disease.  Unless I find this, he could probably be managed medically.  However he is only 61 years of age and further restratification is indicated in view of his symptoms of palpitations and atypical chest pain that he has.  He also has mild dyspnea on exertion.  I will start him on metoprolol succinate 50 mg daily, switch to losartan 100 mg to be taken in the evening.  Hopefully this should also improve his blood pressure.  High intensity high-dose statin, Lipitor 80 mg prescribed although his lipids are relatively within normal limits for a nondiabetic patient but in view of underlying CAD, needs aggressive risk reduction with goal LDL <55.  We will obtain LPA and advanced lipid profile, CRP along with precardiac catheterization BMP and CBC.  S/L NTG was prescribed and explained how to and when to use it and to notify us if there is change in frequency of use. Interaction with cialis-like agents (if applicable was discussed).   Schedule for cardiac catheterization, and possible angioplasty. We discussed regarding risks, benefits, alternatives to this including stress testing, CTA and continued medical therapy. Patient wants to proceed. Understands <1-2% risk of death, stroke, MI, urgent CABG, bleeding, infection, renal failure but not limited to these. 40 minute OV encounter.    Adrian Prows, MD, [facility] 10/20/2022, 8:47 AM Office: [phone]

## 2022-10-17 NOTE — Progress Notes (Addendum)
Primary Physician/Referring:  Tammi Sou, MD  Patient ID: Todd Bailey, male    DOB: 1961-06-07, 61 y.o.   MRN: [id number]  Chief Complaint  Patient presents with   Chest Pain   Palpitations   HPI:    Todd Bailey  is a 61 y.o. Caucasian male patient with chronic palpitations, previous outpatient extended EKG monitoring it revealed PVCs correlating with palpitations, hypertension, hypercholesterolemia, esophageal stricture SP dilatation in 2001 presents for follow-up of palpitations, chest tightness and also dyspnea on exertion.  Patient has been having palpitations over the past 2 months, described as skipped beats and sometimes feels fullness with palpitations in his neck and short of breath when it happens.  It is very transient.  He also has mild dyspnea on exertion when he climbs up a flight of stairs that started about 2 years ago.  Occasionally has chest fullness when he has skipped beats.  Past Medical History:  Diagnosis Date   Arthritis    Elevated coronary artery calcium score 07/2022   96th %'tile   GERD (gastroesophageal reflux disease)    Hypercholesterolemia 08/2020   TLC->imp 12/2020   Hypertension    Obesity, Class I, BMI 30-34.9    Palpitation 04/2020   Zio patch x 3d -->occ PAC and PVC; triggered events correlate with times of PVCs.  Reassured.   Pulmonary nodules    on coronary ca score CT 08/28/22-->f/u CT 3-6 mo (Dr. Einar Gip)   Past Surgical History:  Procedure Laterality Date   CHOLECYSTECTOMY Right 09/14/2021   Procedure: LAPAROSCOPIC CHOLECYSTECTOMY WITH INTRAOPERATIVE CHOLANGIOGRAM,;  Surgeon: Greer Pickerel, MD;  Location: [facility];  Service: General;  Laterality: Right;   COLONOSCOPY  approx 2018   ? polyp, unclear on recall time--get records.   coron calcium score     08/27/22: 96th%'tile (Dr. Einar Gip)   ESOPHAGEAL DILATION  2001   Dr. Amedeo Plenty.  Had it done twice (GERD-induced stricture)   Heart Rhythm monitoring  04/2020   Zio patch x 3d  -->occ PAC and PVC; triggered events correlate with times of PVCs.  Reassured.   TRANSTHORACIC ECHOCARDIOGRAM  09/28/2022   normal   Family History  Problem Relation Age of Onset   Arthritis Mother    Hearing loss Mother    Heart disease Mother    Arthritis Father    Heart disease Father    High Cholesterol Father    High blood pressure Father     Social History   Tobacco Use   Smoking status: Former    Packs/day: 1.00    Years: 15.00    Total pack years: 15.00    Types: Cigarettes    Quit date: 2001    Years since quitting: 22.8   Smokeless tobacco: Never  Substance Use Topics   Alcohol use: Never   Marital Status: Married  ROS  Review of Systems  Cardiovascular:  Positive for chest pain, dyspnea on exertion and palpitations. Negative for leg swelling.   Objective      10/17/2022    1:58 PM 08/16/2022    9:21 AM 07/25/2022    3:28 PM  Vitals with BMI  Height _0  _1  _2   Weight 193 lbs 204 lbs 10 oz 198 lbs 3 oz  BMI 29.35 57.26 20.35  Systolic 597 416 384  Diastolic 90 89 83  Pulse 69 62 76   Today's Vitals   10/17/22 1358  BP: (!) 144/90  Pulse: 69  Resp: 16  Temp: (!)  97.2 F (36.2 C)  TempSrc: Oral  SpO2: 95%  Weight: 193 lb (87.5 kg)  Height: _0  (1.727 m)   Body mass index is 29.35 kg/m.   Physical Exam Neck:     Vascular: No carotid bruit or JVD.  Cardiovascular:     Rate and Rhythm: Normal rate and regular rhythm.     Pulses: Intact distal pulses.     Heart sounds: Normal heart sounds. No murmur heard.    No gallop.  Pulmonary:     Effort: Pulmonary effort is normal.     Breath sounds: Normal breath sounds.  Abdominal:     General: Bowel sounds are normal.     Palpations: Abdomen is soft.  Musculoskeletal:     Right lower leg: No edema.     Left lower leg: No edema.    Medications and allergies  No Known Allergies   Medication list after today's encounter   Current Outpatient Medications:    acetaminophen (TYLENOL)  325 MG tablet, Take 650 mg by mouth every 6 (six) hours as needed for mild pain or headache., Disp: , Rfl:    aspirin 81 MG chewable tablet, Chew 81 mg by mouth every evening., Disp: , Rfl:    atorvastatin (LIPITOR) 80 MG tablet, Take 1 tablet (80 mg total) by mouth daily. (Patient taking differently: Take 80 mg by mouth daily with lunch.), Disp: 30 tablet, Rfl: 2   losartan (COZAAR) 100 MG tablet, Take 1 tablet (100 mg total) by mouth daily. (Patient taking differently: Take 100 mg by mouth every evening.), Disp: 90 tablet, Rfl: 3   metoprolol succinate (TOPROL-XL) 50 MG 24 hr tablet, Take 1 tablet (50 mg total) by mouth daily. Take with or immediately following a meal. (Patient taking differently: Take 50 mg by mouth daily with lunch. Take with or immediately following a meal.), Disp: 30 tablet, Rfl: 2   nitroGLYCERIN (NITROSTAT) 0.4 MG SL tablet, Place 1 tablet (0.4 mg total) under the tongue every 5 (five) minutes as needed for up to 25 days for chest pain., Disp: 25 tablet, Rfl: 3   Omega-3 Fatty Acids (FISH OIL PO), Take 1,000 mg by mouth every evening., Disp: , Rfl:    omeprazole (PRILOSEC) 20 MG capsule, Take 1 capsule (20 mg total) by mouth daily. (Patient taking differently: Take 20 mg by mouth every evening.), Disp: 90 capsule, Rfl: 1   Cholecalciferol (VITAMIN D3 PO), Take 1 tablet by mouth every evening., Disp: , Rfl:   Laboratory examination:   Last metabolic panel Lab Results  Component Value Date   GLUCOSE 125 (H) 10/18/2022   NA 139 10/18/2022   K 5.0 10/18/2022   CL 103 10/18/2022   CO2 22 10/18/2022   BUN 15 10/18/2022   CREATININE 0.94 10/18/2022   EGFR 92 10/18/2022   CALCIUM 9.8 10/18/2022   PROT 6.5 10/23/2021   ALBUMIN 4.2 10/23/2021   BILITOT 0.6 10/23/2021   ALKPHOS 90 10/23/2021   AST 20 10/23/2021   ALT 30 10/23/2021   ANIONGAP 7 09/14/2021   Lipid Panel Recent Labs    10/23/21 1545  CHOL 154  TRIG 112.0  LDLCALC 98  VLDL 22.4  HDL 34.30*  CHOLHDL  4   Apolipo. B/A-1 Ratio 10/23/2022 0.0 - 0.7 ratio 0.8 High    Lipoprotein (a) 10/23/2022 <75.0 nmol/L <8.4    HEMOGLOBIN A1C No results found for: "HGBA1C", "MPG" TSH Recent Labs    10/23/21 1545 07/25/22 1609  TSH 1.96 2.17  Radiology:    Cardiac Studies:   Outpatient Zio patch 2 weeks for 07/19/2020: Predominant rhythm is normal sinus rhythm.  Occasional isolated PVCs and PACs.  Triggered events correlated with PVCs.  Coronary calcium score 08/27/2022:  Total Agatston Score: 1147. MESA database percentile: 96 M164 LAD 541 LCx 212 RCA 230. Ascending and descending aortic measurements are normal. Multiple pulmonary nodules, most severe 8 mm in the right lower lobe and posterior left lower lobe..  If patient high risk for malignancy.  Recommend additional noncontrast chest CT.  PCV MYOCARDIAL PERFUSION WO LEXISCAN 09/24/2022  Narrative Exercise Sestamibi stress test 09/24/2022: Exercise nuclear stress test was performed using Bruce protocol. Patient reached 8.8 METS, and 87% of age predicted maximum heart rate. Exercise capacity was good. No chest pain reported. Normal heart rate. Resting hypertension 160/90 mmHg, with normal blood pressure response. Stress EKG revealed no ischemic changes. Normal myocardial perfusion. Normal wall motion and myocardial thickening. Stress LVEF calculated 41%, but visually appears normal. Intermediate risk stud due to reduced LVEF.  PCV ECHOCARDIOGRAM COMPLETE 09/28/2022  Normal LV systolic function with visual EF 60-65%. Left ventricle cavity is normal in size. Normal left ventricular wall thickness. Normal global wall motion. Normal diastolic filling pattern, normal LAP. No significant valvular abnormalities. No prior study for comparison.     EKG:   EKG 08/16/2022: Normal sinus rhythm at rate of 60 beats 1, normal axis.  Incomplete right bundle branch block.  No evidence of ischemia, normal EKG.    Assessment     ICD-10-CM   1.  Coronary artery disease due to calcified coronary lesion  Y70.62 Basic metabolic panel   B76.28 CBC    Lipoprotein A (LPA)    Apo A1 + B + Ratio    High sensitivity CRP    nitroGLYCERIN (NITROSTAT) 0.4 MG SL tablet    2. Precordial pain  R07.2     3. Elevated coronary artery calcium score 08/27/2022: Total Agatston Score: 1147. MESA database percentile: 96  R93.1     4. Primary hypertension  I10        Orders Placed This Encounter  Procedures   Basic metabolic panel   CBC   Lipoprotein A (LPA)   Apo A1 + B + Ratio   High sensitivity CRP   Meds ordered this encounter  Medications   metoprolol succinate (TOPROL-XL) 50 MG 24 hr tablet    Sig: Take 1 tablet (50 mg total) by mouth daily. Take with or immediately following a meal.    Dispense:  30 tablet    Refill:  2   atorvastatin (LIPITOR) 80 MG tablet    Sig: Take 1 tablet (80 mg total) by mouth daily.    Dispense:  30 tablet    Refill:  2   nitroGLYCERIN (NITROSTAT) 0.4 MG SL tablet    Sig: Place 1 tablet (0.4 mg total) under the tongue every 5 (five) minutes as needed for up to 25 days for chest pain.    Dispense:  25 tablet    Refill:  3    There are no discontinued medications.    Recommendations:   Todd Bailey is a 61 y.o.  Caucasian male patient with chronic palpitations, previous outpatient extended EKG monitoring it revealed PVCs correlating with palpitations, hypertension, hypercholesterolemia, esophageal stricture SP dilatation in 2001 presents for follow-up of palpitations, chest tightness and also dyspnea on exertion.  Patient underwent coronary calcium scoring which revealed him to be extremely high risk category with score greater  than thousand placing event greater than 90th percentile.  Reviewed the nuclear stress test, revealing decreased LVEF and may indicate multivessel disease.  I have recommended proceeding with cardiac catheterization to evaluate for left main disease, proximal LAD or large  vessel proximal disease.  Unless I find this, he could probably be managed medically.  However he is only 61 years of age and further restratification is indicated in view of his symptoms of palpitations and atypical chest pain that he has.  He also has mild dyspnea on exertion.  I will start him on metoprolol succinate 50 mg daily, switch to losartan 100 mg to be taken in the evening.  Hopefully this should also improve his blood pressure.  High intensity high-dose statin, Lipitor 80 mg prescribed although his lipids are relatively within normal limits for a nondiabetic patient but in view of underlying CAD, needs aggressive risk reduction with goal LDL <55.  We will obtain LPA and advanced lipid profile, CRP along with precardiac catheterization BMP and CBC.  S/L NTG was prescribed and explained how to and when to use it and to notify us if there is change in frequency of use. Interaction with cialis-like agents (if applicable was discussed).   Schedule for cardiac catheterization, and possible angioplasty. We discussed regarding risks, benefits, alternatives to this including stress testing, CTA and continued medical therapy. Patient wants to proceed. Understands <1-2% risk of death, stroke, MI, urgent CABG, bleeding, infection, renal failure but not limited to these. 40 minute OV encounter.    Adrian Prows, MD, [facility] 10/20/2022, 8:47 AM Office: [phone]

## 2022-10-18 ENCOUNTER — Encounter: Payer: Self-pay | Admitting: Orthopedic Surgery

## 2022-10-18 DIAGNOSIS — I2510 Atherosclerotic heart disease of native coronary artery without angina pectoris: Secondary | ICD-10-CM | POA: Diagnosis not present

## 2022-10-18 DIAGNOSIS — E785 Hyperlipidemia, unspecified: Secondary | ICD-10-CM | POA: Diagnosis not present

## 2022-10-18 DIAGNOSIS — I2584 Coronary atherosclerosis due to calcified coronary lesion: Secondary | ICD-10-CM | POA: Diagnosis not present

## 2022-10-18 NOTE — Progress Notes (Signed)
Office Visit Note   Patient: Todd Bailey           Date of Birth: Apr 06, 1961           MRN: [id number] Visit Date: 10/17/2022 Requested by: Jeoffrey Massed, MD [address] [place],  [place] [address] PCP: Jeoffrey Massed, MD  Subjective: Chief Complaint  Patient presents with   Left Shoulder - Pain    HPI: Todd Bailey is a 61 y.o. male who presents to the office reporting left shoulder pain.  MRI scan has been obtained and it shows mild tendinosis of the infraspinatus tendon, moderate tendinosis of the subscapularis tendon, moderate tendinosis of the biceps tendon and posterior labral degeneration.  He describes pain that wakes him from sleep at night if his arm is in a "bad position".  Does not report any weakness just pain.  No radiation and no neck pain.  He plans on retiring in March.  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  Symptoms are worse with reaching behind and across his body.  MRI scan on further review does not show much in the way of capsular thickening of the axillary recess..                ROS: All systems reviewed are negative as they relate to the chief complaint within the history of present illness.  Patient denies fevers or chills.  Assessment & Plan: Visit Diagnoses:  1. Left shoulder pain, unspecified chronicity     Plan: Impression is left shoulder pain which may be biceps tendon related, could be early frozen shoulder with radiculopathy less likely.  Plan is intra-articular injection today with range of motion exercises and 6-week return for clinical recheck.  He does have a little bit of limitation of passive range of motion on the left today even though the capsule is not particularly thickened inferiorly on MRI scan.  Follow-Up Instructions: No follow-ups on file.   Orders:  Orders Placed This Encounter  Procedures   US Guided Needle Placement - No Linked Charges   No orders of the defined types were placed in this  encounter.     Procedures: Large Joint Inj: L glenohumeral on 10/17/2022 10:50 AM Indications: diagnostic evaluation and pain Details: 18 G 1.5 in needle, ultrasound-guided posterior approach  Arthrogram: No  Medications: 9 mL bupivacaine 0.5 %; 40 mg methylPREDNISolone acetate 40 MG/ML; 5 mL lidocaine 1 % Outcome: tolerated well, no immediate complications Procedure, treatment alternatives, risks and benefits explained, specific risks discussed. Consent was given by the patient. Immediately prior to procedure a time out was called to verify the correct patient, procedure, equipment, support staff and site/side marked as required. Patient was prepped and draped in the usual sterile fashion.       Clinical Data: No additional findings.  Objective: Vital Signs: There were no vitals taken for this visit.  Physical Exam:  Constitutional: Patient appears well-developed HEENT:  Head: Normocephalic Eyes:EOM are normal Neck: Normal range of motion Cardiovascular: Normal rate Pulmonary/chest: Effort normal Neurologic: Patient is alert Skin: Skin is warm Psychiatric: Patient has normal mood and affect  Ortho Exam: Ortho exam demonstrates range of motion on the right of 70/120/180.  On the left motion is 65/90/175.  Rotator cuff strength is excellent infraspinatus supraspinatus and subscap muscle testing.  Internal rotation on the right to T7 on the left to the posterior superior iliac crest.  No masses lymphadenopathy or skin changes noted in that shoulder girdle  region.  Specialty Comments:  No specialty comments available.  Imaging: No results found.   PMFS History: Patient Active Problem List   Diagnosis Date Noted   Cholelithiasis 09/12/2021   Cholecystitis 09/12/2021   H/O adenomatous polyp of colon 04/05/2016   De Quervain's tenosynovitis, right 12/27/2014   Arthritis 12/06/2014   Essential hypertension 12/06/2014   GERD (gastroesophageal reflux disease) 12/06/2014    Hyperlipemia 12/06/2014   Prediabetes 12/06/2014   Past Medical History:  Diagnosis Date   Arthritis    Elevated coronary artery calcium score 07/2022   96th %'tile   GERD (gastroesophageal reflux disease)    Hypercholesterolemia 08/2020   TLC->imp 12/2020   Hypertension    Obesity, Class I, BMI 30-34.9    Palpitation 04/2020   Zio patch x 3d -->occ PAC and PVC; triggered events correlate with times of PVCs.  Reassured.   Pulmonary nodules    on coronary ca score CT 08/28/22-->f/u CT 3-6 mo (Dr. Einar Gip)    Family History  Problem Relation Age of Onset   Arthritis Mother    Hearing loss Mother    Heart disease Mother    Arthritis Father    Heart disease Father    High Cholesterol Father    High blood pressure Father     Past Surgical History:  Procedure Laterality Date   CHOLECYSTECTOMY Right 09/14/2021   Procedure: LAPAROSCOPIC CHOLECYSTECTOMY WITH INTRAOPERATIVE CHOLANGIOGRAM,;  Surgeon: Greer Pickerel, MD;  Location: [facility];  Service: General;  Laterality: Right;   COLONOSCOPY  approx 2018   ? polyp, unclear on recall time--get records.   coron calcium score     08/27/22: 96th%'tile (Dr. Einar Gip)   ESOPHAGEAL DILATION  2001   Dr. Amedeo Plenty.  Had it done twice (GERD-induced stricture)   Heart Rhythm monitoring  04/2020   Zio patch x 3d -->occ PAC and PVC; triggered events correlate with times of PVCs.  Reassured.   TRANSTHORACIC ECHOCARDIOGRAM  09/28/2022   normal   Social History   Occupational History   Not on file  Tobacco Use   Smoking status: Former    Packs/day: 1.00    Years: 15.00    Total pack years: 15.00    Types: Cigarettes    Quit date: 2001    Years since quitting: 22.8   Smokeless tobacco: Never  Vaping Use   Vaping Use: Never used  Substance and Sexual Activity   Alcohol use: Never   Drug use: Never   Sexual activity: Not on file

## 2022-10-19 LAB — CBC
Hematocrit: 49.1 % (ref 37.5–51.0)
Hemoglobin: 17.2 g/dL (ref 13.0–17.7)
MCH: 30.6 pg (ref 26.6–33.0)
MCHC: 35 g/dL (ref 31.5–35.7)
MCV: 87 fL (ref 79–97)
Platelets: 251 10*3/uL (ref 150–450)
RBC: 5.63 x10E6/uL (ref 4.14–5.80)
RDW: 11.7 % (ref 11.6–15.4)
WBC: 9.5 10*3/uL (ref 3.4–10.8)

## 2022-10-19 LAB — APO A1 + B + RATIO
Apolipo. B/A-1 Ratio: 0.8 ratio — ABNORMAL HIGH (ref 0.0–0.7)
Apolipoprotein A-1: 118 mg/dL (ref 101–178)
Apolipoprotein B: 89 mg/dL (ref <90)

## 2022-10-19 LAB — BASIC METABOLIC PANEL
BUN/Creatinine Ratio: 16 (ref 10–24)
BUN: 15 mg/dL (ref 8–27)
CO2: 22 mmol/L (ref 20–29)
Calcium: 9.8 mg/dL (ref 8.6–10.2)
Chloride: 103 mmol/L (ref 96–106)
Creatinine, Ser: 0.94 mg/dL (ref 0.76–1.27)
Glucose: 125 mg/dL — ABNORMAL HIGH (ref 70–99)
Potassium: 5 mmol/L (ref 3.5–5.2)
Sodium: 139 mmol/L (ref 134–144)
eGFR: 92 mL/min/{1.73_m2} (ref >59)

## 2022-10-19 LAB — HIGH SENSITIVITY CRP: CRP, High Sensitivity: 0.47 mg/L (ref 0.00–3.00)

## 2022-10-19 LAB — LIPOPROTEIN A (LPA): Lipoprotein (a): <8.4 nmol/L (ref <75.0)

## 2022-10-21 MED ORDER — METHYLPREDNISOLONE ACETATE 40 MG/ML IJ SUSP
40.0000 mg | INTRAMUSCULAR | Status: AC | PRN
  Administered 2022-10-17: 40 mg via INTRA_ARTICULAR

## 2022-10-21 MED ORDER — LIDOCAINE HCL 1 % IJ SOLN
5.0000 mL | INTRAMUSCULAR | Status: AC | PRN
  Administered 2022-10-17: 5 mL

## 2022-10-21 MED ORDER — BUPIVACAINE HCL 0.5 % IJ SOLN
9.0000 mL | INTRAMUSCULAR | Status: AC | PRN
  Administered 2022-10-17: 9 mL via INTRA_ARTICULAR

## 2022-10-22 ENCOUNTER — Other Ambulatory Visit: Payer: Self-pay

## 2022-10-22 MED ORDER — LOSARTAN POTASSIUM 100 MG PO TABS: 100.0000 mg | ORAL_TABLET | Freq: Every day | ORAL | 0 refills | Status: DC

## 2022-10-23 ENCOUNTER — Ambulatory Visit (HOSPITAL_COMMUNITY)
Admission: RE | Admit: 2022-10-23 | Discharge: 2022-10-23 | Disposition: A | Discharge status: Home / Self Care | Payer: BC Managed Care – PPO | Attending: Cardiology | Admitting: Cardiology

## 2022-10-23 ENCOUNTER — Encounter (HOSPITAL_COMMUNITY)
Admission: RE | Disposition: A | Discharge status: Home / Self Care | Payer: Self-pay | Source: Home / Self Care | Attending: Cardiology

## 2022-10-23 ENCOUNTER — Encounter (HOSPITAL_COMMUNITY): Payer: Self-pay | Admitting: Cardiology

## 2022-10-23 ENCOUNTER — Other Ambulatory Visit: Payer: Self-pay

## 2022-10-23 DIAGNOSIS — I10 Essential (primary) hypertension: Secondary | ICD-10-CM | POA: Diagnosis not present

## 2022-10-23 DIAGNOSIS — E7800 Pure hypercholesterolemia, unspecified: Secondary | ICD-10-CM | POA: Insufficient documentation

## 2022-10-23 DIAGNOSIS — R0609 Other forms of dyspnea: Secondary | ICD-10-CM | POA: Diagnosis not present

## 2022-10-23 DIAGNOSIS — I2584 Coronary atherosclerosis due to calcified coronary lesion: Secondary | ICD-10-CM | POA: Diagnosis not present

## 2022-10-23 DIAGNOSIS — I2510 Atherosclerotic heart disease of native coronary artery without angina pectoris: Secondary | ICD-10-CM | POA: Diagnosis not present

## 2022-10-23 DIAGNOSIS — Z79899 Other long term (current) drug therapy: Secondary | ICD-10-CM | POA: Diagnosis not present

## 2022-10-23 DIAGNOSIS — Z87891 Personal history of nicotine dependence: Secondary | ICD-10-CM | POA: Insufficient documentation

## 2022-10-23 DIAGNOSIS — I25118 Atherosclerotic heart disease of native coronary artery with other forms of angina pectoris: Secondary | ICD-10-CM

## 2022-10-23 HISTORY — PX: LEFT HEART CATH AND CORONARY ANGIOGRAPHY: CATH118249

## 2022-10-23 SURGERY — LEFT HEART CATH AND CORONARY ANGIOGRAPHY
Anesthesia: LOCAL

## 2022-10-23 MED ORDER — ASPIRIN 81 MG PO CHEW: 81.0000 mg | CHEWABLE_TABLET | ORAL | Status: DC

## 2022-10-23 MED ORDER — ACETAMINOPHEN 325 MG PO TABS: 650.0000 mg | ORAL_TABLET | ORAL | Status: DC | PRN

## 2022-10-23 MED ORDER — ASPIRIN 81 MG PO CHEW
81.0000 mg | CHEWABLE_TABLET | ORAL | Status: AC
  Administered 2022-10-23: 81 mg via ORAL
  Filled 2022-10-23: qty 1

## 2022-10-23 MED ORDER — LIDOCAINE HCL (PF) 1 % IJ SOLN
INTRAMUSCULAR | Status: DC | PRN
  Administered 2022-10-23: 2 mL via INTRADERMAL

## 2022-10-23 MED ORDER — IOHEXOL 350 MG/ML SOLN
INTRAVENOUS | Status: DC | PRN
  Administered 2022-10-23: 65 mL

## 2022-10-23 MED ORDER — SODIUM CHLORIDE 0.9 % WEIGHT BASED INFUSION: 1.0000 mL/kg/h | INTRAVENOUS | Status: DC

## 2022-10-23 MED ORDER — SODIUM CHLORIDE 0.9 % WEIGHT BASED INFUSION
3.0000 mL/kg/h | INTRAVENOUS | Status: AC
  Administered 2022-10-23: 3 mL/kg/h via INTRAVENOUS

## 2022-10-23 MED ORDER — FENTANYL CITRATE (PF) 100 MCG/2ML IJ SOLN
INTRAMUSCULAR | Status: DC | PRN
  Administered 2022-10-23: 25 ug via INTRAVENOUS

## 2022-10-23 MED ORDER — MIDAZOLAM HCL 2 MG/2ML IJ SOLN
INTRAMUSCULAR | Status: DC | PRN
  Administered 2022-10-23: 2 mg via INTRAVENOUS

## 2022-10-23 MED ORDER — ONDANSETRON HCL 4 MG/2ML IJ SOLN: 4.0000 mg | Freq: Four times a day (QID) | INTRAMUSCULAR | Status: DC | PRN

## 2022-10-23 MED ORDER — HEPARIN SODIUM (PORCINE) 1000 UNIT/ML IJ SOLN
INTRAMUSCULAR | Status: AC
  Filled 2022-10-23: qty 10

## 2022-10-23 MED ORDER — VERAPAMIL HCL 2.5 MG/ML IV SOLN
INTRAVENOUS | Status: DC | PRN
  Administered 2022-10-23: 10 mL via INTRA_ARTERIAL

## 2022-10-23 MED ORDER — SODIUM CHLORIDE 0.9% FLUSH: 3.0000 mL | Freq: Two times a day (BID) | INTRAVENOUS | Status: DC

## 2022-10-23 MED ORDER — HEPARIN SODIUM (PORCINE) 1000 UNIT/ML IJ SOLN
INTRAMUSCULAR | Status: DC | PRN
  Administered 2022-10-23: 5000 [IU] via INTRAVENOUS

## 2022-10-23 MED ORDER — MIDAZOLAM HCL 2 MG/2ML IJ SOLN
INTRAMUSCULAR | Status: AC
  Filled 2022-10-23: qty 2

## 2022-10-23 MED ORDER — HEPARIN (PORCINE) IN NACL 1000-0.9 UT/500ML-% IV SOLN
INTRAVENOUS | Status: DC | PRN
  Administered 2022-10-23 (×2): 500 mL

## 2022-10-23 MED ORDER — SODIUM CHLORIDE 0.9% FLUSH: 3.0000 mL | INTRAVENOUS | Status: DC | PRN

## 2022-10-23 MED ORDER — FENTANYL CITRATE (PF) 100 MCG/2ML IJ SOLN
INTRAMUSCULAR | Status: AC
  Filled 2022-10-23: qty 2

## 2022-10-23 MED ORDER — VERAPAMIL HCL 2.5 MG/ML IV SOLN
INTRAVENOUS | Status: AC
  Filled 2022-10-23: qty 2

## 2022-10-23 MED ORDER — HEPARIN (PORCINE) IN NACL 1000-0.9 UT/500ML-% IV SOLN
INTRAVENOUS | Status: AC
  Filled 2022-10-23: qty 500

## 2022-10-23 MED ORDER — SODIUM CHLORIDE 0.9 % IV SOLN: 250.0000 mL | INTRAVENOUS | Status: DC | PRN

## 2022-10-23 SURGICAL SUPPLY — 10 items
CATH INFINITI 5 FR JL3.5 (CATHETERS) IMPLANT
CATH OPTITORQUE TIG 4.0 5F (CATHETERS) IMPLANT
DEVICE RAD COMP TR BAND LRG (VASCULAR PRODUCTS) IMPLANT
GLIDESHEATH SLEND A-KIT 6F 22G (SHEATH) IMPLANT
GUIDEWIRE INQWIRE 1.5J.035X260 (WIRE) IMPLANT
INQWIRE 1.5J .035X260CM (WIRE) ×1
KIT HEART LEFT (KITS) ×1 IMPLANT
PACK CARDIAC CATHETERIZATION (CUSTOM PROCEDURE TRAY) ×1 IMPLANT
TRANSDUCER W/STOPCOCK (MISCELLANEOUS) ×1 IMPLANT
TUBING CIL FLEX 10 FLL-RA (TUBING) ×1 IMPLANT

## 2022-10-23 NOTE — Interval H&P Note (Signed)
History and Physical Interval Note:  10/23/2022 8:39 AM  Todd Bailey  has presented today for surgery, with the diagnosis of cad.  The various methods of treatment have been discussed with the patient and family. After consideration of risks, benefits and other options for treatment, the patient has consented to  Procedure(s): LEFT HEART CATH AND CORONARY ANGIOGRAPHY (N/A) and possible angioplasty as a surgical intervention.  The patient's history has been reviewed, patient examined, no change in status, stable for surgery.  I have reviewed the patient's chart and labs.  Questions were answered to the patient's satisfaction.   Cath Lab Visit (complete for each Cath Lab visit)  Clinical Evaluation Leading to the Procedure:   ACS: No.  Non-ACS:    Anginal Classification: CCS II  Anti-ischemic medical therapy: Minimal Therapy (1 class of medications)  Non-Invasive Test Results: Intermediate-risk stress test findings: cardiac mortality 1-3%/year  Prior CABG: No previous CABG   Adrian Prows

## 2022-11-13 ENCOUNTER — Encounter: Payer: Self-pay | Admitting: Cardiology

## 2022-11-13 ENCOUNTER — Ambulatory Visit: Payer: BC Managed Care – PPO | Admitting: Cardiology

## 2022-11-13 VITALS — BP 130/80 | HR 62 | Temp 97.8°F | Resp 16 | Ht 68.0 in | Wt 200.4 lb

## 2022-11-13 DIAGNOSIS — E7800 Pure hypercholesterolemia, unspecified: Secondary | ICD-10-CM

## 2022-11-13 DIAGNOSIS — I2584 Coronary atherosclerosis due to calcified coronary lesion: Secondary | ICD-10-CM | POA: Diagnosis not present

## 2022-11-13 DIAGNOSIS — I10 Essential (primary) hypertension: Secondary | ICD-10-CM | POA: Diagnosis not present

## 2022-11-13 DIAGNOSIS — I2510 Atherosclerotic heart disease of native coronary artery without angina pectoris: Secondary | ICD-10-CM | POA: Diagnosis not present

## 2022-11-13 MED ORDER — METOPROLOL SUCCINATE ER 50 MG PO TB24: 50.0000 mg | ORAL_TABLET | Freq: Every day | ORAL | 3 refills | Status: DC

## 2022-11-13 MED ORDER — LOSARTAN POTASSIUM 100 MG PO TABS: 100.0000 mg | ORAL_TABLET | Freq: Every evening | ORAL | 3 refills | Status: AC

## 2022-11-13 MED ORDER — ATORVASTATIN CALCIUM 20 MG PO TABS: 20.0000 mg | ORAL_TABLET | Freq: Every day | ORAL | 3 refills | Status: AC

## 2022-11-13 NOTE — Progress Notes (Signed)
Primary Physician/Referring:  Tammi Sou, MD  Patient ID: Todd Bailey, male    DOB: [birth year], 61 y.o.   MRN: [id number]  Chief Complaint  Patient presents with   Coronary Artery Disease   Follow-up    4 weeks   Hypertension   HPI:    Todd Bailey  is a 61 y.o. Caucasian male patient with chronic palpitations, previous outpatient extended EKG monitoring it revealed PVCs correlating with palpitations, hypertension, hypercholesterolemia, esophageal stricture SP dilatation in 2001 presents for follow-up of palpitations, chest tightness and also dyspnea on exertion.  Due to markedly elevated coronary calcium score and also abnormal nuclear stress test, underwent coronary angiography on 10/23/2022.  He has not had any recurrence of chest pain since.  Past Medical History:  Diagnosis Date   Arthritis    Elevated coronary artery calcium score 07/2022   96th %'tile   GERD (gastroesophageal reflux disease)    Hypercholesterolemia 08/2020   TLC->imp 12/2020   Hypertension    Obesity, Class I, BMI 30-34.9    Palpitation 04/2020   Zio patch x 3d -->occ PAC and PVC; triggered events correlate with times of PVCs.  Reassured.   Pulmonary nodules    on coronary ca score CT 08/28/22-->f/u CT 3-6 mo (Dr. Einar Gip)   Past Surgical History:  Procedure Laterality Date   CHOLECYSTECTOMY Right 09/14/2021   Procedure: LAPAROSCOPIC CHOLECYSTECTOMY WITH INTRAOPERATIVE CHOLANGIOGRAM,;  Surgeon: Greer Pickerel, MD;  Location: [facility];  Service: General;  Laterality: Right;   COLONOSCOPY  approx 2018   ? polyp, unclear on recall time--get records.   coron calcium score     08/27/22: 96th%'tile (Dr. Einar Gip)   ESOPHAGEAL DILATION  2001   Dr. Amedeo Plenty.  Had it done twice (GERD-induced stricture)   Heart Rhythm monitoring  04/2020   Zio patch x 3d -->occ PAC and PVC; triggered events correlate with times of PVCs.  Reassured.   LEFT HEART CATH AND CORONARY ANGIOGRAPHY N/A 10/23/2022   Procedure:  LEFT HEART CATH AND CORONARY ANGIOGRAPHY;  Surgeon: Adrian Prows, MD;  Location: [facility];  Service: Cardiovascular;  Laterality: N/A;   TRANSTHORACIC ECHOCARDIOGRAM  09/28/2022   normal   Family History  Problem Relation Age of Onset   Arthritis Mother    Hearing loss Mother    Heart disease Mother    Arthritis Father    Heart disease Father    High Cholesterol Father    High blood pressure Father     Social History   Tobacco Use   Smoking status: Former    Packs/day: 1.00    Years: 15.00    Total pack years: 15.00    Types: Cigarettes    Quit date: 2001    Years since quitting: 22.8   Smokeless tobacco: Never  Substance Use Topics   Alcohol use: Never   Marital Status: Married  ROS  Review of Systems  Cardiovascular:  Positive for dyspnea on exertion and palpitations. Negative for chest pain and leg swelling.  Gastrointestinal:  Positive for heartburn.   Objective      11/13/2022    2:40 PM 11/13/2022    2:34 PM 10/23/2022   10:55 AM  Vitals with BMI  Height  _0    Weight  200 lbs 6 oz   BMI  26.37   Systolic 858 850   Diastolic 80 88   Pulse  62 59   Today's Vitals   11/13/22 1434 11/13/22 1440  BP: (!) 151/88  130/80  Pulse: 62   Resp: 16   Temp: 97.8 F (36.6 C)   TempSrc: Temporal   SpO2: 96%   Weight: 200 lb 6.4 oz (90.9 kg)   Height: _0  (1.727 m)    Body mass index is 30.47 kg/m.   Physical Exam Neck:     Vascular: No carotid bruit or JVD.  Cardiovascular:     Rate and Rhythm: Normal rate and regular rhythm.     Pulses: Intact distal pulses.     Heart sounds: Normal heart sounds. No murmur heard.    No gallop.  Pulmonary:     Effort: Pulmonary effort is normal.     Breath sounds: Normal breath sounds.  Abdominal:     General: Bowel sounds are normal.     Palpations: Abdomen is soft.  Musculoskeletal:     Right lower leg: No edema.     Left lower leg: No edema.    Medications and allergies  No Known Allergies    Medication list after today's encounter   Current Outpatient Medications:    acetaminophen (TYLENOL) 325 MG tablet, Take 650 mg by mouth every 6 (six) hours as needed for mild pain or headache., Disp: , Rfl:    aspirin 81 MG chewable tablet, Chew 81 mg by mouth every evening., Disp: , Rfl:    Cholecalciferol (VITAMIN D3 PO), Take 1 tablet by mouth every evening., Disp: , Rfl:    nitroGLYCERIN (NITROSTAT) 0.4 MG SL tablet, Place 1 tablet (0.4 mg total) under the tongue every 5 (five) minutes as needed for up to 25 days for chest pain., Disp: 25 tablet, Rfl: 3   Omega-3 Fatty Acids (FISH OIL PO), Take 1,000 mg by mouth every evening., Disp: , Rfl:    omeprazole (PRILOSEC) 20 MG capsule, Take 1 capsule (20 mg total) by mouth daily. (Patient taking differently: Take 20 mg by mouth every evening.), Disp: 90 capsule, Rfl: 1   atorvastatin (LIPITOR) 20 MG tablet, Take 1 tablet (20 mg total) by mouth daily., Disp: 90 tablet, Rfl: 3   losartan (COZAAR) 100 MG tablet, Take 1 tablet (100 mg total) by mouth at bedtime., Disp: 90 tablet, Rfl: 3   metoprolol succinate (TOPROL-XL) 50 MG 24 hr tablet, Take 1 tablet (50 mg total) by mouth daily with lunch. Take with or immediately following a meal., Disp: 90 tablet, Rfl: 3  Laboratory examination:   Last metabolic panel Lab Results  Component Value Date   GLUCOSE 125 (H) 10/18/2022   NA 139 10/18/2022   K 5.0 10/18/2022   CL 103 10/18/2022   CO2 22 10/18/2022   BUN 15 10/18/2022   CREATININE 0.94 10/18/2022   EGFR 92 10/18/2022   CALCIUM 9.8 10/18/2022   PROT 6.5 10/23/2021   ALBUMIN 4.2 10/23/2021   BILITOT 0.6 10/23/2021   ALKPHOS 90 10/23/2021   AST 20 10/23/2021   ALT 30 10/23/2021   ANIONGAP 7 09/14/2021   Lab Results  Component Value Date   CHOL 154 10/23/2021   HDL 34.30 (L) 10/23/2021   LDLCALC 98 10/23/2021   TRIG 112.0 10/23/2021   CHOLHDL 4 10/23/2021    Apolipo. B/A-1 Ratio 10/23/2022 0.0 - 0.7 ratio 0.8 High     Lipoprotein (a) 10/23/2022 <75.0 nmol/L <8.4   Apolipo. B/A-1 Ratio 10/23/2022 0.0 - 0.7 ratio 0.8 High     HEMOGLOBIN A1C No results found for: "HGBA1C", "MPG" TSH Recent Labs    07/25/22 1609  TSH 2.17   Radiology:  Cardiac Studies:   Outpatient Zio patch 2 weeks for 07/19/2020: Predominant rhythm is normal sinus rhythm.  Occasional isolated PVCs and PACs.  Triggered events correlated with PVCs.  Coronary calcium score 08/27/2022:  Total Agatston Score: 1147. MESA database percentile: 96 M164 LAD 541 LCx 212 RCA 230. Ascending and descending aortic measurements are normal. Multiple pulmonary nodules, most severe 8 mm in the right lower lobe and posterior left lower lobe..  If patient high risk for malignancy.  Recommend additional noncontrast chest CT.  PCV MYOCARDIAL PERFUSION WO LEXISCAN 09/24/2022  Narrative Exercise Sestamibi stress test 09/24/2022: Exercise nuclear stress test was performed using Bruce protocol. Patient reached 8.8 METS, and 87% of age predicted maximum heart rate. Exercise capacity was good. No chest pain reported. Normal heart rate. Resting hypertension 160/90 mmHg, with normal blood pressure response. Stress EKG revealed no ischemic changes. Normal myocardial perfusion. Normal wall motion and myocardial thickening. Stress LVEF calculated 41%, but visually appears normal. Intermediate risk stud due to reduced LVEF.  PCV ECHOCARDIOGRAM COMPLETE 09/28/2022  Normal LV systolic function with visual EF 60-65%. Left ventricle cavity is normal in size. Normal left ventricular wall thickness. Normal global wall motion. Normal diastolic filling pattern, normal LAP. No significant valvular abnormalities. No prior study for comparison.  Left Heart Catheterization 10/23/22:  LV 142/-1, EDP 11 mmHg.  Aorta 136/69, mean 95 mmHg.  There is no pressure clinical 75.  LVEF 55 to 60% with no wall motion abnormality.  No significant mitral regurgitation. Left Main  Ost LM to Mid LAD lesion is 5% stenosed. The lesion is irregular. The lesion is mildly calcified. Ramus Intermedius Ramus lesion is 30% stenosed. Left Anterior Descending mild diffuse disease, mild to moderate diffuse coronary calcification noted all the way from the ostium to the mid segment.  There are 2 moderate-sized D1 and D2 which have calcification, D1 has a focal 60% stenosis. Left Circumflex Prox Cx to Mid Cx diffuse lesion is 5% stenosed. The lesion is mildly calcified.  Right Coronary Artery Prox and distal RCA lesion is 20% stenosed. The lesion is mildly calcified. Mild diffuse calcification noted throughout.       EKG:   EKG 08/16/2022: Normal sinus rhythm at rate of 60 beats 1, normal axis.  Incomplete right bundle branch block.  No evidence of ischemia, normal EKG.    Assessment     ICD-10-CM   1. Coronary artery disease due to calcified coronary lesion  I25.10    I25.84     2. Primary hypertension  I10     3. Pure hypercholesterolemia  E78.00        No orders of the defined types were placed in this encounter.  Meds ordered this encounter  Medications   atorvastatin (LIPITOR) 20 MG tablet    Sig: Take 1 tablet (20 mg total) by mouth daily.    Dispense:  90 tablet    Refill:  3   losartan (COZAAR) 100 MG tablet    Sig: Take 1 tablet (100 mg total) by mouth at bedtime.    Dispense:  90 tablet    Refill:  3    OFFICE VISIT NEEDED FOR FURTHER REFILLS   metoprolol succinate (TOPROL-XL) 50 MG 24 hr tablet    Sig: Take 1 tablet (50 mg total) by mouth daily with lunch. Take with or immediately following a meal.    Dispense:  90 tablet    Refill:  3    Medications Discontinued During This Encounter  Medication Reason  metoprolol succinate (TOPROL-XL) 50 MG 24 hr tablet Reorder   atorvastatin (LIPITOR) 80 MG tablet Reorder   losartan (COZAAR) 100 MG tablet Reorder      Recommendations:   Todd Bailey is a 61 y.o.  Caucasian male patient with chronic  palpitations, previous outpatient extended EKG monitoring it revealed PVCs correlating with palpitations, hypertension, hypercholesterolemia, esophageal stricture SP dilatation in 2001 presents for follow-up of palpitations, chest tightness and also dyspnea on exertion.  Due to markedly elevated coronary calcium score and also abnormal nuclear stress test, underwent coronary angiography on 10/23/2022.  He has not had any recurrence of chest pain since.  1. Coronary artery disease due to calcified coronary lesion Patient only has mild disease in most of the vessels and only moderate disease of 70% in D1.  Continue with aggressive medical therapy.  2. Primary hypertension Patient's blood pressure is much improved and controlled with present medical regimen.  I have refilled his prescriptions.  3. Pure hypercholesterolemia Lipids were mildly abnormal, LPA is normal, ApoB 1/APO A1-2 ratio is only minimally abnormal.  I had started him on high intensity high-dose statin in view of chest pain and dyspnea which could be anginal equivalent, now that his cardiac catheterization only reveals mild disease, I will reduce the dose of the Lipitor to 20 mg daily, will obtain lipid profile testing in 2 to 3 months, can be performed by Dr. Crissie Sickles or myself, I have encouraged him to make an appointment to follow-up with his PCP in 2 to 3 months.  I will see him back at 73-monthinterval after this.  If he remains stable, I will see him back in 1 year and then probably as needed depending upon his progress.    JAdrian Prows MD, [facility]11/14/2023, 5:32 PM Office: [phone]

## 2022-11-14 ENCOUNTER — Ambulatory Visit (INDEPENDENT_AMBULATORY_CARE_PROVIDER_SITE_OTHER): Payer: BC Managed Care – PPO | Admitting: Orthopedic Surgery

## 2022-11-14 DIAGNOSIS — M67814 Other specified disorders of tendon, left shoulder: Secondary | ICD-10-CM | POA: Diagnosis not present

## 2022-11-16 ENCOUNTER — Encounter: Payer: Self-pay | Admitting: Orthopedic Surgery

## 2022-11-16 NOTE — Progress Notes (Signed)
Office Visit Note   Patient: Todd Bailey           Date of Birth: 1961-12-29           MRN: [id number] Visit Date: 11/14/2022 Requested by: Jeoffrey Massed, MD [address] [place],  [place] [address] PCP: Jeoffrey Massed, MD  Subjective: Chief Complaint  Patient presents with   Left Shoulder - Follow-up    HPI: Todd Bailey is a 61 y.o. male who presents to the office reporting left shoulder pain.  Patient had glenohumeral injection 10/17/2022 which gave him about 80% improvement.  States that his range of motion improved and his pain improved.  Describes some increased pain with extreme motion or past the level of comfort.  The injection may be tapering off a little bit at this time.  He works as a Emergency planning/management officer.  Denies any AC joint symptoms..                ROS: All systems reviewed are negative as they relate to the chief complaint within the history of present illness.  Patient denies fevers or chills.  Assessment & Plan: Visit Diagnoses: No diagnosis found.  Plan: Impression is left shoulder pain with prior MRI scan demonstrating biceps tendinosis and rotator cuff tear and tendinosis which is mild to moderate.  The fact that the intra-articular injection gave him good relief indicates that his symptoms may very well be coming from the inflamed biceps tendon.  Surgical treatment and nonoperative treatment discussed.  Surgical treatment would be arthroscopy with debridement and biceps tenodesis.  The expected rehab time is discussed in relation to his job as a Emergency planning/management officer.  Do not anticipate having to deal with any type of rotator cuff pathology.  Patient understands risk benefits and will consider his option and let us know if he wants to schedule.  Follow-Up Instructions: No follow-ups on file.   Orders:  No orders of the defined types were placed in this encounter.  No orders of the defined types were placed in this encounter.      Procedures: No procedures performed   Clinical Data: No additional findings.  Objective: Vital Signs: There were no vitals taken for this visit.  Physical Exam:  Constitutional: Patient appears well-developed HEENT:  Head: Normocephalic Eyes:EOM are normal Neck: Normal range of motion Cardiovascular: Normal rate Pulmonary/chest: Effort normal Neurologic: Patient is alert Skin: Skin is warm Psychiatric: Patient has normal mood and affect  Ortho Exam: Ortho exam demonstrates range of motion on the left of 60/95/160.  Rotator cuff strength is intact infraspinatus supraspinatus and subscap muscle testing.  Does have positive biceps tendon tenderness positive speeds test and positive O'Brien's testing on the left compared to the right.  No coarse grinding or crepitus with internal/external rotation of that left shoulder at 90 degrees of abduction.  Specialty Comments:  No specialty comments available.  Imaging: No results found.   PMFS History: Patient Active Problem List   Diagnosis Date Noted   Coronary artery disease of native artery of native heart with stable angina pectoris (HCC)    Cholelithiasis 09/12/2021   Cholecystitis 09/12/2021   H/O adenomatous polyp of colon 04/05/2016   De Quervain's tenosynovitis, right 12/27/2014   Arthritis 12/06/2014   Essential hypertension 12/06/2014   GERD (gastroesophageal reflux disease) 12/06/2014   Hyperlipemia 12/06/2014   Prediabetes 12/06/2014   Past Medical History:  Diagnosis Date   Arthritis    Elevated coronary artery  calcium score 07/2022   96th %'tile   GERD (gastroesophageal reflux disease)    Hypercholesterolemia 08/2020   TLC->imp 12/2020   Hypertension    Obesity, Class I, BMI 30-34.9    Palpitation 04/2020   Zio patch x 3d -->occ PAC and PVC; triggered events correlate with times of PVCs.  Reassured.   Pulmonary nodules    on coronary ca score CT 08/28/22-->f/u CT 3-6 mo (Dr. Jacinto Halim)    Family History   Problem Relation Age of Onset   Arthritis Mother    Hearing loss Mother    Heart disease Mother    Arthritis Father    Heart disease Father    High Cholesterol Father    High blood pressure Father     Past Surgical History:  Procedure Laterality Date   CHOLECYSTECTOMY Right 09/14/2021   Procedure: LAPAROSCOPIC CHOLECYSTECTOMY WITH INTRAOPERATIVE CHOLANGIOGRAM,;  Surgeon: [name], MD;  Location: [facility];  Service: General;  Laterality: Right;   COLONOSCOPY  approx 2018   ? polyp, unclear on recall time--get records.   coron calcium score     08/27/22: 96th%'tile (Dr. Jacinto Halim)   ESOPHAGEAL DILATION  2001   Dr. Madilyn Fireman.  Had it done twice (GERD-induced stricture)   Heart Rhythm monitoring  04/2020   Zio patch x 3d -->occ PAC and PVC; triggered events correlate with times of PVCs.  Reassured.   LEFT HEART CATH AND CORONARY ANGIOGRAPHY N/A 10/23/2022   Procedure: LEFT HEART CATH AND CORONARY ANGIOGRAPHY;  Surgeon: Yates Decamp, MD;  Location: [facility];  Service: Cardiovascular;  Laterality: N/A;   TRANSTHORACIC ECHOCARDIOGRAM  09/28/2022   normal   Social History   Occupational History   Not on file  Tobacco Use   Smoking status: Former    Packs/day: 1.00    Years: 15.00    Total pack years: 15.00    Types: Cigarettes    Quit date: 2001    Years since quitting: 22.8   Smokeless tobacco: Never  Vaping Use   Vaping Use: Never used  Substance and Sexual Activity   Alcohol use: Never   Drug use: Never   Sexual activity: Not on file

## 2022-11-19 NOTE — Progress Notes (Signed)
FYI:  patient is scheduled for left shoulder arthroscopy, debridement, biceps tenodesis, bursectomy at [facility] 12-06-22 at 3:30pm

## 2022-11-21 ENCOUNTER — Other Ambulatory Visit: Payer: Self-pay

## 2022-11-23 IMAGING — RF DG CHOLANGIOGRAM OPERATIVE
1 series · 1 of 1 positions shown · non-contrast
Comparison: None.

CLINICAL DATA: Intraoperative cholangiogram

EXAM:
INTRAOPERATIVE CHOLANGIOGRAM
TECHNIQUE: Cholangiographic images from the C-arm fluoroscopic device were
submitted for interpretation post-operatively. Please see the
procedural report for the amount of contrast and the fluoroscopy
time utilized.

[Series 1: run · 1 of 1 slices shown]
[im 1/1]
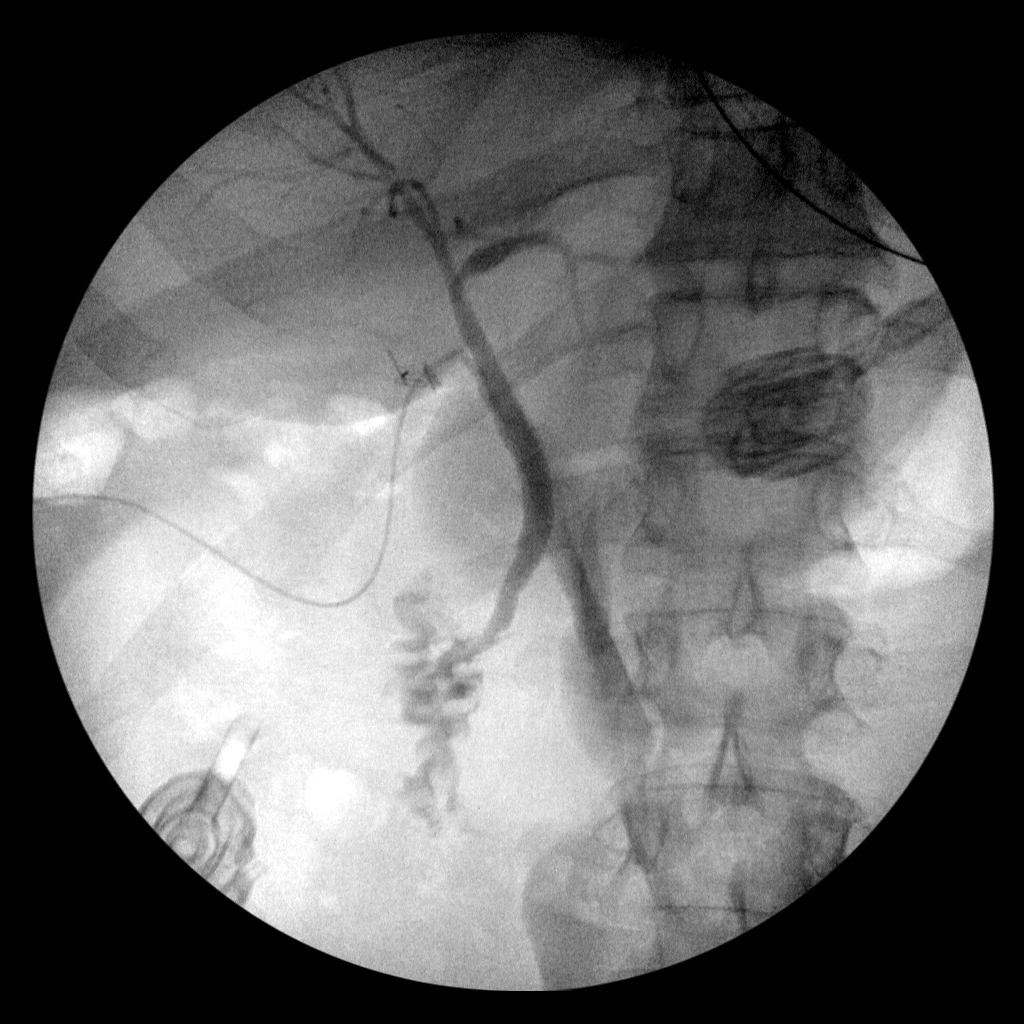

[1 of 1 positions shown; findings below may reference images not displayed]

FINDINGS: A single fluoroscopic spot image is submitted for review. There is
surgical instrumentation overlying the gallbladder fossa where there
are surgical clips. Contrast material is seen opacifying both the
intrahepatic and extrahepatic biliary ducts. There is passage of
contrast material into the small bowel. No apparent filling defects
within the biliary tree.
IMPRESSION: Intraoperative cholangiogram as described. Refer to operative report
for full details.

## 2022-11-29 NOTE — Pre-Procedure Instructions (Signed)
Surgical Instructions    Your procedure is scheduled on Thursday, December 7th.  Report to [facility] Main Entrance "A" at 13:50 P.M., then check in with the Admitting office.  Call this number if you have problems the morning of surgery:  [phone]   If you have any questions prior to your surgery date call [phone]: Open Monday-Friday 8am-4pm    Remember:  Do not eat after midnight the night before your surgery  You may drink clear liquids until 12:50 PM the day of your surgery.   Clear liquids allowed are: Water, Non-Citrus Juices (without pulp), Carbonated Beverages, Clear Tea, Black Coffee Only (NO MILK, CREAM OR POWDERED CREAMER of any kind), and Gatorade.  Patient Instructions  The night before surgery:  No food after midnight. ONLY clear liquids after midnight  The day of surgery (if you do NOT have diabetes):  Drink ONE (1) Pre-Surgery Clear Ensure by 12:50 PM the day of surgery. Drink in one sitting. Do not sip.  This drink was given to you during your hospital  pre-op appointment visit.  Nothing else to drink after completing the  Pre-Surgery Clear Ensure.           If you have questions, please contact your surgeon's office.      Take these medicines the morning of surgery with A SIP OF WATER  atorvastatin (LIPITOR)  metoprolol succinate (TOPROL-XL)  omeprazole (PRILOSEC)   If needed: acetaminophen (TYLENOL)  nitroGLYCERIN (NITROSTAT)   Follow your surgeon's instructions on when to stop Aspirin.  If no instructions were given by your surgeon then you will need to call the office to get those instructions.     As of today, STOP taking any Aspirin (unless otherwise instructed by your surgeon) Aleve, Naproxen, Ibuprofen, Motrin, Advil, Goody's, BC's, all herbal medications, fish oil, and all vitamins.                     Do NOT Smoke (Tobacco/Vaping) for 24 hours prior to your procedure.  If you use a CPAP at night, you may bring your mask/headgear  for your overnight stay.   Contacts, glasses, piercing's, hearing aid's, dentures or partials may not be worn into surgery, please bring cases for these belongings.    For patients admitted to the hospital, discharge time will be determined by your treatment team.   Patients discharged the day of surgery will not be allowed to drive home, and someone needs to stay with them for 24 hours.  SURGICAL WAITING ROOM VISITATION Patients having surgery or a procedure may have no more than 2 support people in the waiting area - these visitors may rotate.   Children under the age of 78 must have an adult with them who is not the patient. If the patient needs to stay at the hospital during part of their recovery, the visitor guidelines for inpatient rooms apply. Pre-op nurse will coordinate an appropriate time for 1 support person to accompany patient in pre-op.  This support person may not rotate.   Please refer to the [facility] website for the visitor guidelines for Inpatients (after your surgery is over and you are in a regular room).    Special instructions:   - Preparing For Surgery  Before surgery, you can play an important role. Because skin is not sterile, your skin needs to be as free of germs as possible. You can reduce the number of germs on your skin by washing with CHG (chlorahexidine gluconate) Soap  before surgery.  CHG is an antiseptic cleaner which kills germs and bonds with the skin to continue killing germs even after washing.    Oral Hygiene is also important to reduce your risk of infection.  Remember - BRUSH YOUR TEETH THE MORNING OF SURGERY WITH YOUR REGULAR TOOTHPASTE  Please do not use if you have an allergy to CHG or antibacterial soaps. If your skin becomes reddened/irritated stop using the CHG.  Do not shave (including legs and underarms) for at least 48 hours prior to first CHG shower. It is OK to shave your face.  Please follow these instructions  carefully.   Shower the NIGHT BEFORE SURGERY and the MORNING OF SURGERY  If you chose to wash your hair, wash your hair first as usual with your normal shampoo.  After you shampoo, rinse your hair and body thoroughly to remove the shampoo.  Use CHG Soap as you would any other liquid soap. You can apply CHG directly to the skin and wash gently with a scrungie or a clean washcloth.   Apply the CHG Soap to your body ONLY FROM THE NECK DOWN.  Do not use on open wounds or open sores. Avoid contact with your eyes, ears, mouth and genitals (private parts). Wash Face and genitals (private parts)  with your normal soap.   Wash thoroughly, paying special attention to the area where your surgery will be performed.  Thoroughly rinse your body with warm water from the neck down.  DO NOT shower/wash with your normal soap after using and rinsing off the CHG Soap.  Pat yourself dry with a CLEAN TOWEL.  Wear CLEAN PAJAMAS to bed the night before surgery  Place CLEAN SHEETS on your bed the night before your surgery  DO NOT SLEEP WITH PETS.   Day of Surgery: Take a shower with CHG soap. Do not wear jewelry  Do not wear lotions, powders, colognes, or deodorant. Men may shave face and neck. Do not bring valuables to the hospital. [facility] is not responsible for any belongings or valuables.  Wear Clean/Comfortable clothing the morning of surgery Remember to brush your teeth WITH YOUR REGULAR TOOTHPASTE.   Please read over the following fact sheets that you were given.    If you received a COVID test during your pre-op visit  it is requested that you wear a mask when out in public, stay away from anyone that may not be feeling well and notify your surgeon if you develop symptoms. If you have been in contact with anyone that has tested positive in the last 10 days please notify you surgeon.

## 2022-11-30 ENCOUNTER — Other Ambulatory Visit: Payer: Self-pay

## 2022-11-30 ENCOUNTER — Encounter (HOSPITAL_COMMUNITY)
Admission: RE | Admit: 2022-11-30 | Discharge: 2022-11-30 | Disposition: A | Discharge status: Home / Self Care | Payer: BC Managed Care – PPO | Source: Ambulatory Visit | Attending: Orthopedic Surgery | Admitting: Orthopedic Surgery

## 2022-11-30 ENCOUNTER — Encounter (HOSPITAL_COMMUNITY): Payer: Self-pay

## 2022-11-30 DIAGNOSIS — Z01818 Encounter for other preprocedural examination: Secondary | ICD-10-CM

## 2022-11-30 DIAGNOSIS — M7522 Bicipital tendinitis, left shoulder: Secondary | ICD-10-CM | POA: Insufficient documentation

## 2022-11-30 DIAGNOSIS — M7552 Bursitis of left shoulder: Secondary | ICD-10-CM | POA: Insufficient documentation

## 2022-11-30 DIAGNOSIS — Z87891 Personal history of nicotine dependence: Secondary | ICD-10-CM | POA: Diagnosis not present

## 2022-11-30 DIAGNOSIS — Z01812 Encounter for preprocedural laboratory examination: Secondary | ICD-10-CM | POA: Diagnosis not present

## 2022-11-30 LAB — BASIC METABOLIC PANEL
Anion gap: 9 (ref 5–15)
BUN: 11 mg/dL (ref 8–23)
CO2: 29 mmol/L (ref 22–32)
Calcium: 9.9 mg/dL (ref 8.9–10.3)
Chloride: 105 mmol/L (ref 98–111)
Creatinine, Ser: 1.06 mg/dL (ref 0.61–1.24)
GFR, Estimated: >60 mL/min (ref >60)
Glucose, Bld: 120 mg/dL — ABNORMAL HIGH (ref 70–99)
Potassium: 3.8 mmol/L (ref 3.5–5.1)
Sodium: 143 mmol/L (ref 135–145)

## 2022-11-30 LAB — CBC
HCT: 43.4 % (ref 39.0–52.0)
Hemoglobin: 14.8 g/dL (ref 13.0–17.0)
MCH: 30.3 pg (ref 26.0–34.0)
MCHC: 34.1 g/dL (ref 30.0–36.0)
MCV: 88.8 fL (ref 80.0–100.0)
Platelets: 198 10*3/uL (ref 150–400)
RBC: 4.89 MIL/uL (ref 4.22–5.81)
RDW: 12.1 % (ref 11.5–15.5)
WBC: 6.5 10*3/uL (ref 4.0–10.5)
nRBC: 0 % (ref 0.0–0.2)

## 2022-11-30 NOTE — Progress Notes (Signed)
PCP - Dr. Nicoletta Ba Cardiologist - Dr. Yates Decamp  PPM/ICD - denies   Chest x-ray - denies EKG - 10/23/22 Stress Test - 09/24/22 ECHO - 09/28/22 Cardiac Cath - 10/23/22  Sleep Study - denies   DM- denies    Blood Thinner Instructions: n/a Aspirin Instructions: f/u with surgeon  ERAS Protcol - yes PRE-SURGERY Ensure given at PAT  COVID TEST- n/a   Anesthesia review: yes, cardiac hx  Patient denies shortness of breath, fever, cough and chest pain at PAT appointment   All instructions explained to the patient, with a verbal understanding of the material. Patient agrees to go over the instructions while at home for a better understanding.  The opportunity to ask questions was provided.

## 2022-12-03 NOTE — Anesthesia Preprocedure Evaluation (Signed)
Anesthesia Evaluation  Patient identified by MRN, date of birth, ID band Patient awake    Reviewed: Allergy & Precautions, NPO status , Patient's Chart, lab work & pertinent test results  Airway Mallampati: II  TM Distance: >3 FB Neck ROM: Full    Dental no notable dental hx. (+) Teeth Intact, Dental Advisory Given   Pulmonary former smoker   Pulmonary exam normal breath sounds clear to auscultation       Cardiovascular hypertension, Normal cardiovascular exam Rhythm:Regular Rate:Normal  Left Heart Catheterization 10/23/22: LV 142/-1, EDP 11 mmHg. Aorta 136/69, mean 95 mmHg. There is no pressure clinical 75. LVEF 55 to 60% with no wall motion abnormality. No significant mitral regurgitation. Left MainOst LM to Mid LAD lesion is 5% stenosed. The lesion is irregular. The lesion is mildly calcified. Ramus IntermediusRamus lesion is 30% stenosed. Left Anterior Descendingmild diffuse disease, mild to moderate diffuse coronary calcification noted all the way from the ostium to the mid segment. There are 2 moderate-sized D1 and D2 which have calcification, D1 has a focal 60% stenosis. Left CircumflexProx Cx to Mid Cx diffuse lesion is 5% stenosed. The lesion is mildly calcified.  Right Coronary ArteryProx and distal RCA lesion is 20% stenosed. The lesion is mildly calcified. Mild diffuse calcification noted throughout.  Recommendation: Continue primary prevention. Aggressive risk modification.   Echocardiogram 09/28/2022:  Normal LV systolic function with visual EF 60-65%. Left ventricle cavity  is normal in size. Normal left ventricular wall thickness. Normal global  wall motion. Normal diastolic filling pattern, normal LAP.  No significant valvular abnormalities.  No prior study for comparison.     Neuro/Psych negative neurological ROS     GI/Hepatic ,GERD  ,,  Endo/Other  negative endocrine ROS    Renal/GU negative  Renal ROS     Musculoskeletal  (+) Arthritis ,    Abdominal   Peds  Hematology   Anesthesia Other Findings   Reproductive/Obstetrics                             Anesthesia Physical Anesthesia Plan  ASA: 2  Anesthesia Plan: General and Regional   Post-op Pain Management: Regional block* and Minimal or no pain anticipated   Induction: Intravenous  PONV Risk Score and Plan: Treatment may vary due to age or medical condition, Midazolam and Ondansetron  Airway Management Planned: Oral ETT  Additional Equipment: None  Intra-op Plan:   Post-operative Plan: Extubation in OR  Informed Consent: I have reviewed the patients History and Physical, chart, labs and discussed the procedure including the risks, benefits and alternatives for the proposed anesthesia with the patient or authorized representative who has indicated his/her understanding and acceptance.     Dental advisory given  Plan Discussed with: CRNA, Anesthesiologist and Surgeon  Anesthesia Plan Comments: (PAT note written 12/03/2022 by Shonna Chock, PA-C.   )       Anesthesia Quick Evaluation

## 2022-12-03 NOTE — Progress Notes (Signed)
Anesthesia Chart Review:  Case: [id number] Date/Time: 12/06/22 1536   Procedures:      left shoulder arthroscopy, debridement, bursectomy (Left: Shoulder)     BICEPS TENODESIS (Left: Shoulder)   Anesthesia type: General   Pre-op diagnosis: left shoulder biceps tendonitis, bursitis   Location: [facility] OR ROOM 07 / [facility]   Surgeons: Meredith Pel, MD       DISCUSSION: Patient is a 22 male scheduled for the above procedure.  History includes former smoker (quit 01/01/00), HTN, hypercholesterolemia, CAD (non-obstructive 10/23/22 with elevated Coronary Calcium Score 96th percentile 08/27/22), palpitations (occasional PVCs 2021 monitor), GERD, pulmonary nodules (08/27/22 CT).   Last cardiology visit with Dr. Einar Gip was on 11/03/22. No recurrent chest pain since 10/23/22 LHC showing overall mild disease. Aggressive medical therapy recommended. BP much improved on current regimen. Lipitor dose decreased following LHC results, recommended repeat Lipid panel in 2-3 months. Three month cardiology follow-up planned.    Extensive cardiac testing done this year as outlined below with medical therapy recommended. Anesthesia team to evaluate on the day of surgery.   VS: BP (!) 134/94   Pulse 78   Temp 36.6 C   Resp 17   Ht 5\' 8"  (1.727 m)   Wt 88.9 kg   SpO2 97%   BMI 29.80 kg/m    PROVIDERS: McGowen, Adrian Blackwater, MD is PCP Adrian Prows, MD is cardiologist   LABS: Labs reviewed: Acceptable for surgery. (all labs ordered are listed, but only abnormal results are displayed)  Labs Reviewed  BASIC METABOLIC PANEL - Abnormal; Notable for the following components:      Result Value   Glucose, Bld 120 (*)    All other components within normal limits  CBC     IMAGES: MRI Left Shoulder 10/15/22: IMPRESSION: 1. Mild tendinosis of the infraspinatus tendon. 2. Moderate tendinosis of the subscapularis tendon. 3. Moderate tendinosis of the intra-articular portion of the long head of the biceps  tendon. 4. Posterior labral degeneration with a tear. 3 mm posteroinferior paralabral cyst.   CT Cardiac Scoring 08/27/22: IMPRESSION: 1. Coronary calcium score is 1147 and this is at percentile 96 for patients of the same age, gender and ethnicity. 2. Multiple pulmonary nodules. Most severe: 8 mm left solid pulmonary nodule. Per Fleischner Society Guidelines, recommend a non-contrast Chest CT at 3-6 months. If patient is high risk for malignancy, recommend an additional non-contrast Chest CT at 18-24 months; if patient is low risk for malignancy a non-contrast Chest CT at 18-24 months is optional. These guidelines do not apply to immunocompromised patients and patients with cancer. Follow up in patients with significant comorbidities as clinically warranted. For lung cancer screening, adhere to Lung-RADS guidelines. Reference: Radiology. 2017; 284(1):228-43.   EKG: 10/23/22: Sinus bradycardia at 55 bpm Otherwise normal ECG Since last tracing rate slower Confirmed by Dorris Carnes [phone]) on 10/23/2022 9:00:29 PM   CV: Left Heart Catheterization 10/23/22:  LV 142/-1, EDP 11 mmHg.  Aorta 136/69, mean 95 mmHg.  There is no pressure clinical 75.  LVEF 55 to 60% with no wall motion abnormality.  No significant mitral regurgitation. Left Main Ost LM to Mid LAD lesion is 5% stenosed. The lesion is irregular. The lesion is mildly calcified. Ramus Intermedius Ramus lesion is 30% stenosed. Left Anterior Descending mild diffuse disease, mild to moderate diffuse coronary calcification noted all the way from the ostium to the mid segment.  There are 2 moderate-sized D1 and D2 which have calcification, D1 has a focal 60%  stenosis. Left Circumflex Prox Cx to Mid Cx diffuse lesion is 5% stenosed. The lesion is mildly calcified.  Right Coronary Artery Prox and distal RCA lesion is 20% stenosed. The lesion is mildly calcified. Mild diffuse calcification noted throughout.  Recommendation: Continue  primary prevention.  Aggressive risk modification.    Echocardiogram 09/28/2022:  Normal LV systolic function with visual EF 60-65%. Left ventricle cavity  is normal in size. Normal left ventricular wall thickness. Normal global  wall motion. Normal diastolic filling pattern, normal LAP.  No significant valvular abnormalities.  No prior study for comparison.    Exercise Sestamibi stress test 09/24/2022: Exercise nuclear stress test was performed using Bruce protocol. Patient reached 8.8 METS, and 87% of age predicted maximum heart rate. Exercise capacity was good. No chest pain reported. Normal heart rate. Resting hypertension 160/90 mmHg, with normal blood pressure response. Stress EKG revealed no ischemic changes. Normal myocardial perfusion. Normal wall motion and myocardial thickening. Stress LVEF calculated 41%, but visually appears normal. Intermediate risk stud due to reduced LVEF. - Referred for LHC.   Coronary calcium score 08/27/2022: Total Agatston Score: 1147. MESA database percentile: 96 M164 LAD 541 LCx 212 RCA 230. Ascending and descending aortic measurements are normal. Multiple pulmonary nodules, most severe 8 mm in the right lower lobe and posterior left lower lobe..  If patient high risk for malignancy.  Recommend additional noncontrast chest CT.   Long term monitor 04/06/2020-04/09/2020: NSR Occasional isolated PVC's and PAV's Patient complaints and triggered events correlate with PVC's  Past Medical History:  Diagnosis Date   Arthritis    Elevated coronary artery calcium score 07/2022   96th %'tile   GERD (gastroesophageal reflux disease)    Hypercholesterolemia 08/2020   TLC->imp 12/2020   Hypertension    Obesity, Class I, BMI 30-34.9    Palpitation 04/2020   Zio patch x 3d -->occ PAC and PVC; triggered events correlate with times of PVCs.  Reassured.   Pulmonary nodules    on coronary ca score CT 08/28/22-->f/u CT 3-6 mo (Dr. Jacinto Halim)    Past Surgical  History:  Procedure Laterality Date   CHOLECYSTECTOMY Right 09/14/2021   Procedure: LAPAROSCOPIC CHOLECYSTECTOMY WITH INTRAOPERATIVE CHOLANGIOGRAM,;  Surgeon: [name], MD;  Location: [facility] ORS;  Service: General;  Laterality: Right;   COLONOSCOPY  approx 2018   ? polyp, unclear on recall time--get records.   coron calcium score     08/27/22: 96th%'tile (Dr. Jacinto Halim)   ESOPHAGEAL DILATION  2001   Dr. Madilyn Fireman.  Had it done twice (GERD-induced stricture)   Heart Rhythm monitoring  04/2020   Zio patch x 3d -->occ PAC and PVC; triggered events correlate with times of PVCs.  Reassured.   LEFT HEART CATH AND CORONARY ANGIOGRAPHY N/A 10/23/2022   Procedure: LEFT HEART CATH AND CORONARY ANGIOGRAPHY;  Surgeon: Yates Decamp, MD;  Location: [facility] INVASIVE [facility];  Service: Cardiovascular;  Laterality: N/A;   TRANSTHORACIC ECHOCARDIOGRAM  09/28/2022   normal    MEDICATIONS:  acetaminophen (TYLENOL) 325 MG tablet   aspirin 81 MG chewable tablet   atorvastatin (LIPITOR) 20 MG tablet   Cholecalciferol (VITAMIN D3) 125 MCG (5000 UT) TABS   HAWTHORN BERRY PO   losartan (COZAAR) 100 MG tablet   metoprolol succinate (TOPROL-XL) 50 MG 24 hr tablet   nitroGLYCERIN (NITROSTAT) 0.4 MG SL tablet   Omega-3 Fatty Acids (FISH OIL PO)   omeprazole (PRILOSEC) 20 MG capsule   No current facility-administered medications for this encounter.    Shonna Chock, PA-C  Surgical Short Stay/Anesthesiology [facility] Phone [phone] [facility] Phone [phone] 12/03/2022 6:25 PM

## 2022-12-06 ENCOUNTER — Ambulatory Visit (HOSPITAL_COMMUNITY): Payer: BC Managed Care – PPO | Admitting: Vascular Surgery

## 2022-12-06 ENCOUNTER — Ambulatory Visit (HOSPITAL_COMMUNITY)
Admission: RE | Admit: 2022-12-06 | Discharge: 2022-12-06 | Disposition: A | Discharge status: Home / Self Care | Payer: BC Managed Care – PPO | Source: Ambulatory Visit | Attending: Orthopedic Surgery | Admitting: Orthopedic Surgery

## 2022-12-06 ENCOUNTER — Ambulatory Visit (HOSPITAL_COMMUNITY): Payer: BC Managed Care – PPO | Admitting: Anesthesiology

## 2022-12-06 ENCOUNTER — Other Ambulatory Visit: Payer: Self-pay

## 2022-12-06 ENCOUNTER — Encounter (HOSPITAL_COMMUNITY)
Admission: RE | Disposition: A | Discharge status: Home / Self Care | Payer: Self-pay | Source: Ambulatory Visit | Attending: Orthopedic Surgery

## 2022-12-06 ENCOUNTER — Encounter (HOSPITAL_COMMUNITY): Payer: Self-pay | Admitting: Orthopedic Surgery

## 2022-12-06 DIAGNOSIS — Z6829 Body mass index (BMI) 29.0-29.9, adult: Secondary | ICD-10-CM | POA: Diagnosis not present

## 2022-12-06 DIAGNOSIS — X58XXXA Exposure to other specified factors, initial encounter: Secondary | ICD-10-CM | POA: Insufficient documentation

## 2022-12-06 DIAGNOSIS — Z01818 Encounter for other preprocedural examination: Secondary | ICD-10-CM

## 2022-12-06 DIAGNOSIS — G8918 Other acute postprocedural pain: Secondary | ICD-10-CM | POA: Diagnosis not present

## 2022-12-06 DIAGNOSIS — E7800 Pure hypercholesterolemia, unspecified: Secondary | ICD-10-CM | POA: Insufficient documentation

## 2022-12-06 DIAGNOSIS — M7522 Bicipital tendinitis, left shoulder: Secondary | ICD-10-CM | POA: Insufficient documentation

## 2022-12-06 DIAGNOSIS — I10 Essential (primary) hypertension: Secondary | ICD-10-CM | POA: Diagnosis not present

## 2022-12-06 DIAGNOSIS — M7552 Bursitis of left shoulder: Secondary | ICD-10-CM | POA: Diagnosis not present

## 2022-12-06 DIAGNOSIS — Z87891 Personal history of nicotine dependence: Secondary | ICD-10-CM | POA: Insufficient documentation

## 2022-12-06 DIAGNOSIS — E669 Obesity, unspecified: Secondary | ICD-10-CM | POA: Diagnosis not present

## 2022-12-06 DIAGNOSIS — S43432A Superior glenoid labrum lesion of left shoulder, initial encounter: Secondary | ICD-10-CM | POA: Insufficient documentation

## 2022-12-06 DIAGNOSIS — K219 Gastro-esophageal reflux disease without esophagitis: Secondary | ICD-10-CM | POA: Diagnosis not present

## 2022-12-06 HISTORY — PX: BICEPT TENODESIS: SHX5116

## 2022-12-06 HISTORY — PX: SHOULDER ARTHROSCOPY: SHX128

## 2022-12-06 SURGERY — ARTHROSCOPY, SHOULDER
Anesthesia: Regional | Site: Shoulder | Laterality: Left

## 2022-12-06 MED ORDER — 0.9 % SODIUM CHLORIDE (POUR BTL) OPTIME
TOPICAL | Status: DC | PRN
  Administered 2022-12-06: 1000 mL

## 2022-12-06 MED ORDER — PHENYLEPHRINE 80 MCG/ML (10ML) SYRINGE FOR IV PUSH (FOR BLOOD PRESSURE SUPPORT)
PREFILLED_SYRINGE | INTRAVENOUS | Status: AC
  Filled 2022-12-06: qty 10

## 2022-12-06 MED ORDER — SUGAMMADEX SODIUM 200 MG/2ML IV SOLN
INTRAVENOUS | Status: DC | PRN
  Administered 2022-12-06: 200 mg via INTRAVENOUS

## 2022-12-06 MED ORDER — PHENYLEPHRINE 80 MCG/ML (10ML) SYRINGE FOR IV PUSH (FOR BLOOD PRESSURE SUPPORT)
PREFILLED_SYRINGE | INTRAVENOUS | Status: DC | PRN
  Administered 2022-12-06: 80 ug via INTRAVENOUS

## 2022-12-06 MED ORDER — ORAL CARE MOUTH RINSE: 15.0000 mL | Freq: Once | OROMUCOSAL | Status: AC

## 2022-12-06 MED ORDER — MIDAZOLAM HCL 2 MG/2ML IJ SOLN: 2.0000 mg | Freq: Once | INTRAMUSCULAR | Status: AC

## 2022-12-06 MED ORDER — AMISULPRIDE (ANTIEMETIC) 5 MG/2ML IV SOLN: 10.0000 mg | Freq: Once | INTRAVENOUS | Status: DC | PRN

## 2022-12-06 MED ORDER — EPHEDRINE SULFATE-NACL 50-0.9 MG/10ML-% IV SOSY
PREFILLED_SYRINGE | INTRAVENOUS | Status: DC | PRN
  Administered 2022-12-06: 5 mg via INTRAVENOUS

## 2022-12-06 MED ORDER — EPHEDRINE 5 MG/ML INJ
INTRAVENOUS | Status: AC
  Filled 2022-12-06: qty 5

## 2022-12-06 MED ORDER — MIDAZOLAM HCL 2 MG/2ML IJ SOLN
INTRAMUSCULAR | Status: AC
  Filled 2022-12-06: qty 2

## 2022-12-06 MED ORDER — PROPOFOL 10 MG/ML IV BOLUS
INTRAVENOUS | Status: AC
  Filled 2022-12-06: qty 20

## 2022-12-06 MED ORDER — LIDOCAINE 2% (20 MG/ML) 5 ML SYRINGE
INTRAMUSCULAR | Status: AC
  Filled 2022-12-06: qty 5

## 2022-12-06 MED ORDER — PHENYLEPHRINE HCL-NACL 20-0.9 MG/250ML-% IV SOLN
INTRAVENOUS | Status: DC | PRN
  Administered 2022-12-06: 40 ug/min via INTRAVENOUS

## 2022-12-06 MED ORDER — CHLORHEXIDINE GLUCONATE 0.12 % MT SOLN
15.0000 mL | Freq: Once | OROMUCOSAL | Status: AC
  Administered 2022-12-06: 15 mL via OROMUCOSAL
  Filled 2022-12-06: qty 15

## 2022-12-06 MED ORDER — BUPIVACAINE LIPOSOME 1.3 % IJ SUSP
INTRAMUSCULAR | Status: DC | PRN
  Administered 2022-12-06: 10 mL via PERINEURAL

## 2022-12-06 MED ORDER — ONDANSETRON HCL 4 MG/2ML IJ SOLN: 4.0000 mg | Freq: Once | INTRAMUSCULAR | Status: DC | PRN

## 2022-12-06 MED ORDER — EPINEPHRINE PF 1 MG/ML IJ SOLN
INTRAMUSCULAR | Status: AC
  Filled 2022-12-06: qty 1

## 2022-12-06 MED ORDER — FENTANYL CITRATE (PF) 250 MCG/5ML IJ SOLN
INTRAMUSCULAR | Status: AC
  Filled 2022-12-06: qty 5

## 2022-12-06 MED ORDER — OXYCODONE HCL 5 MG PO TABS: 5.0000 mg | ORAL_TABLET | Freq: Once | ORAL | Status: DC | PRN

## 2022-12-06 MED ORDER — MIDAZOLAM HCL 2 MG/2ML IJ SOLN
INTRAMUSCULAR | Status: DC | PRN
  Administered 2022-12-06: 2 mg via INTRAVENOUS

## 2022-12-06 MED ORDER — ACETAMINOPHEN 10 MG/ML IV SOLN: 1000.0000 mg | Freq: Once | INTRAVENOUS | Status: DC | PRN

## 2022-12-06 MED ORDER — OXYCODONE-ACETAMINOPHEN 5-325 MG PO TABS: 1.0000 | ORAL_TABLET | ORAL | 0 refills | Status: DC | PRN

## 2022-12-06 MED ORDER — FENTANYL CITRATE (PF) 250 MCG/5ML IJ SOLN
INTRAMUSCULAR | Status: DC | PRN
  Administered 2022-12-06: 50 ug via INTRAVENOUS

## 2022-12-06 MED ORDER — OXYCODONE HCL 5 MG/5ML PO SOLN: 5.0000 mg | Freq: Once | ORAL | Status: DC | PRN

## 2022-12-06 MED ORDER — ROCURONIUM BROMIDE 10 MG/ML (PF) SYRINGE
PREFILLED_SYRINGE | INTRAVENOUS | Status: AC
  Filled 2022-12-06: qty 10

## 2022-12-06 MED ORDER — FENTANYL CITRATE (PF) 100 MCG/2ML IJ SOLN: 50.0000 ug | Freq: Once | INTRAMUSCULAR | Status: AC

## 2022-12-06 MED ORDER — LIDOCAINE 2% (20 MG/ML) 5 ML SYRINGE
INTRAMUSCULAR | Status: DC | PRN
  Administered 2022-12-06: 100 mg via INTRAVENOUS

## 2022-12-06 MED ORDER — FENTANYL CITRATE (PF) 100 MCG/2ML IJ SOLN
INTRAMUSCULAR | Status: AC
  Administered 2022-12-06: 50 ug via INTRAVENOUS
  Filled 2022-12-06: qty 2

## 2022-12-06 MED ORDER — HYDROMORPHONE HCL 1 MG/ML IJ SOLN: 0.2500 mg | INTRAMUSCULAR | Status: DC | PRN

## 2022-12-06 MED ORDER — METHOCARBAMOL 500 MG PO TABS: 500.0000 mg | ORAL_TABLET | Freq: Three times a day (TID) | ORAL | 1 refills | Status: DC | PRN

## 2022-12-06 MED ORDER — SODIUM CHLORIDE 0.9 % IR SOLN
Status: DC | PRN
  Administered 2022-12-06: 3000 mL

## 2022-12-06 MED ORDER — BUPIVACAINE HCL (PF) 0.5 % IJ SOLN
INTRAMUSCULAR | Status: DC | PRN
  Administered 2022-12-06: 15 mL

## 2022-12-06 MED ORDER — ROCURONIUM BROMIDE 10 MG/ML (PF) SYRINGE
PREFILLED_SYRINGE | INTRAVENOUS | Status: DC | PRN
  Administered 2022-12-06: 70 mg via INTRAVENOUS
  Administered 2022-12-06: 10 mg via INTRAVENOUS

## 2022-12-06 MED ORDER — LACTATED RINGERS IV SOLN: INTRAVENOUS | Status: DC

## 2022-12-06 MED ORDER — TRANEXAMIC ACID-NACL 1000-0.7 MG/100ML-% IV SOLN
1000.0000 mg | INTRAVENOUS | Status: AC
  Administered 2022-12-06: 1000 mg via INTRAVENOUS
  Filled 2022-12-06: qty 100

## 2022-12-06 MED ORDER — CEFAZOLIN SODIUM-DEXTROSE 2-4 GM/100ML-% IV SOLN
2.0000 g | INTRAVENOUS | Status: AC
  Administered 2022-12-06: 2 g via INTRAVENOUS
  Filled 2022-12-06: qty 100

## 2022-12-06 MED ORDER — PROPOFOL 10 MG/ML IV BOLUS
INTRAVENOUS | Status: DC | PRN
  Administered 2022-12-06: 140 mg via INTRAVENOUS

## 2022-12-06 MED ORDER — DEXAMETHASONE SODIUM PHOSPHATE 10 MG/ML IJ SOLN
INTRAMUSCULAR | Status: AC
  Filled 2022-12-06: qty 1

## 2022-12-06 MED ORDER — MIDAZOLAM HCL 2 MG/2ML IJ SOLN
INTRAMUSCULAR | Status: AC
  Administered 2022-12-06: 2 mg via INTRAVENOUS
  Filled 2022-12-06: qty 2

## 2022-12-06 MED ORDER — DEXAMETHASONE SODIUM PHOSPHATE 10 MG/ML IJ SOLN
INTRAMUSCULAR | Status: DC | PRN
  Administered 2022-12-06: 10 mg via INTRAVENOUS

## 2022-12-06 MED ORDER — ONDANSETRON HCL 4 MG/2ML IJ SOLN
INTRAMUSCULAR | Status: AC
  Filled 2022-12-06: qty 2

## 2022-12-06 SURGICAL SUPPLY — 55 items
ALCOHOL 70% 16 OZ (MISCELLANEOUS) ×1 IMPLANT
ANCH BUTTON FIBERTAK KNEE SP (Anchor) IMPLANT
ANCHOR SUT 1.8 FIBERTAK SB KL (Anchor) IMPLANT
BAG COUNTER SPONGE SURGICOUNT (BAG) ×1 IMPLANT
BLADE EXCALIBUR 4.0X13 (MISCELLANEOUS) ×1 IMPLANT
BLADE SURG 11 STRL SS (BLADE) ×1 IMPLANT
DRAPE IMP U-DRAPE 54X76 (DRAPES) ×1 IMPLANT
DRAPE INCISE IOBAN 66X45 STRL (DRAPES) ×1 IMPLANT
DRAPE STERI 35X30 U-POUCH (DRAPES) ×1 IMPLANT
DRAPE U-SHAPE 47X51 STRL (DRAPES) ×2 IMPLANT
DRSG AQUACEL AG ADV 3.5X 4 (GAUZE/BANDAGES/DRESSINGS) ×1 IMPLANT
DRSG TEGADERM 4X4.75 (GAUZE/BANDAGES/DRESSINGS) IMPLANT
DURAPREP 26ML APPLICATOR (WOUND CARE) ×1 IMPLANT
DW OUTFLOW CASSETTE/TUBE SET (MISCELLANEOUS) ×1 IMPLANT
ELECT REM PT RETURN 9FT ADLT (ELECTROSURGICAL) ×1
ELECTRODE REM PT RTRN 9FT ADLT (ELECTROSURGICAL) ×1 IMPLANT
GAUZE SPONGE 4X4 12PLY STRL (GAUZE/BANDAGES/DRESSINGS) IMPLANT
GAUZE XEROFORM 1X8 LF (GAUZE/BANDAGES/DRESSINGS) ×1 IMPLANT
GLOVE BIOGEL M 6.5 STRL (GLOVE) ×1 IMPLANT
GLOVE BIOGEL PI IND STRL 6.5 (GLOVE) ×1 IMPLANT
GLOVE BIOGEL PI IND STRL 8 (GLOVE) ×1 IMPLANT
GLOVE ECLIPSE 8.0 STRL XLNG CF (GLOVE) ×1 IMPLANT
GOWN STRL REUS W/ TWL LRG LVL3 (GOWN DISPOSABLE) ×3 IMPLANT
GOWN STRL REUS W/TWL LRG LVL3 (GOWN DISPOSABLE) ×3
KIT BASIN OR (CUSTOM PROCEDURE TRAY) ×1 IMPLANT
KIT STR SPEAR 1.8 FBRTK DISP (KITS) IMPLANT
KIT TURNOVER KIT B (KITS) ×1 IMPLANT
MANIFOLD NEPTUNE II (INSTRUMENTS) ×1 IMPLANT
NDL HYPO 25X1 1.5 SAFETY (NEEDLE) ×1 IMPLANT
NDL SPNL 18GX3.5 QUINCKE PK (NEEDLE) ×1 IMPLANT
NDL TAPERED W/ NITINOL LOOP (MISCELLANEOUS) IMPLANT
NEEDLE HYPO 25X1 1.5 SAFETY (NEEDLE) ×1 IMPLANT
NEEDLE SPNL 18GX3.5 QUINCKE PK (NEEDLE) ×1 IMPLANT
NEEDLE TAPERED W/ NITINOL LOOP (MISCELLANEOUS) ×1 IMPLANT
NS IRRIG 1000ML POUR BTL (IV SOLUTION) ×1 IMPLANT
PACK SHOULDER (CUSTOM PROCEDURE TRAY) ×1 IMPLANT
PAD ARMBOARD 7.5X6 YLW CONV (MISCELLANEOUS) ×2 IMPLANT
PORT APPOLLO RF 90DEGREE MULTI (SURGICAL WAND) ×1 IMPLANT
RESTRAINT HEAD UNIVERSAL NS (MISCELLANEOUS) ×1 IMPLANT
SLING ARM FOAM STRAP LRG (SOFTGOODS) IMPLANT
SLING ARM IMMOBILIZER LRG (SOFTGOODS) IMPLANT
SPONGE T-LAP 4X18 ~~LOC~~+RFID (SPONGE) ×1 IMPLANT
STRIP CLOSURE SKIN 1/2X4 (GAUZE/BANDAGES/DRESSINGS) IMPLANT
SUCTION FRAZIER HANDLE 10FR (MISCELLANEOUS)
SUCTION TUBE FRAZIER 10FR DISP (MISCELLANEOUS) IMPLANT
SUT 2 FIBERLOOP 20 STRT BLUE (SUTURE) ×1
SUT ETHILON 3 0 PS 1 (SUTURE) ×1 IMPLANT
SUT MNCRL AB 3-0 PS2 18 (SUTURE) IMPLANT
SUT VIC AB 0 CT1 27 (SUTURE)
SUT VIC AB 0 CT1 27XBRD ANBCTR (SUTURE) IMPLANT
SUTURE 2 FIBERLOOP 20 STRT BLU (SUTURE) IMPLANT
TOWEL GREEN STERILE (TOWEL DISPOSABLE) ×1 IMPLANT
TOWEL GREEN STERILE FF (TOWEL DISPOSABLE) ×1 IMPLANT
TUBING ARTHROSCOPY IRRIG 16FT (MISCELLANEOUS) ×1 IMPLANT
WATER STERILE IRR 1000ML POUR (IV SOLUTION) ×1 IMPLANT

## 2022-12-06 NOTE — Anesthesia Postprocedure Evaluation (Signed)
Anesthesia Post Note  Patient: Todd Bailey  Procedure(s) Performed: left shoulder arthroscopy, debridement, bursectomy (Left: Shoulder) BICEPS TENODESIS (Left: Shoulder)     Patient location during evaluation: PACU Anesthesia Type: Regional and General Level of consciousness: awake Pain management: pain level controlled Vital Signs Assessment: post-procedure vital signs reviewed and stable Respiratory status: spontaneous breathing, nonlabored ventilation and respiratory function stable Cardiovascular status: blood pressure returned to baseline and stable Postop Assessment: no apparent nausea or vomiting Anesthetic complications: no   No notable events documented.  Last Vitals:  Vitals:   12/06/22 1835 12/06/22 1850  BP: (!) 144/90 (!) 141/95  Pulse: 80 79  Resp: 18 20  Temp:  36.4 C  SpO2: 90% 92%    Last Pain:  Vitals:   12/06/22 1850  PainSc: 0-No pain                 Mckenzi Buonomo P Zailen Albarran

## 2022-12-06 NOTE — Anesthesia Procedure Notes (Signed)
Procedure Name: Intubation Date/Time: 12/06/2022 4:37 PM  Performed by: Reece Agar, CRNAPre-anesthesia Checklist: Patient identified, Emergency Drugs available, Suction available and Patient being monitored Patient Re-evaluated:Patient Re-evaluated prior to induction Oxygen Delivery Method: Circle System Utilized Preoxygenation: Pre-oxygenation with 100% oxygen Induction Type: IV induction Ventilation: Mask ventilation without difficulty Laryngoscope Size: Mac and 4 Grade View: Grade I Tube type: Oral Tube size: 7.5 mm Number of attempts: 1 Airway Equipment and Method: Stylet Placement Confirmation: ETT inserted through vocal cords under direct vision, positive ETCO2 and breath sounds checked- equal and bilateral Secured at: 23 cm Tube secured with: Tape Dental Injury: Teeth and Oropharynx as per pre-operative assessment

## 2022-12-06 NOTE — Transfer of Care (Signed)
Immediate Anesthesia Transfer of Care Note  Patient: Todd Bailey  Procedure(s) Performed: left shoulder arthroscopy, debridement, bursectomy (Left: Shoulder) BICEPS TENODESIS (Left: Shoulder)  Patient Location: PACU  Anesthesia Type:General  Level of Consciousness: drowsy  Airway & Oxygen Therapy: Patient Spontanous Breathing and Patient connected to nasal cannula oxygen  Post-op Assessment: Report given to RN and Post -op Vital signs reviewed and stable  Post vital signs: Reviewed and stable  Last Vitals:  Vitals Value Taken Time  BP 136/95 12/06/22 1820  Temp    Pulse 99 12/06/22 1821  Resp 19 12/06/22 1821  SpO2 90 % 12/06/22 1821  Vitals shown include unvalidated device data.  Last Pain:  Vitals:   12/06/22 1530  PainSc: 0-No pain      Patients Stated Pain Goal: 2 (12/06/22 1416)  Complications: No notable events documented.

## 2022-12-06 NOTE — Anesthesia Procedure Notes (Signed)
Anesthesia Regional Block: Interscalene brachial plexus block   Pre-Anesthetic Checklist: , timeout performed,  Correct Patient, Correct Site, Correct Laterality,  Correct Procedure, Correct Position, site marked,  Risks and benefits discussed,  Surgical consent,  Pre-op evaluation,  At surgeon's request and post-op pain management  Laterality: Upper and Left  Prep: Maximum Sterile Barrier Precautions used, chloraprep       Needles:  Injection technique: Single-shot  Needle Type: Echogenic Needle     Needle Length: 5cm  Needle Gauge: 21     Additional Needles:   Procedures:,,,, ultrasound used (permanent image in chart),,    Narrative:  Start time: 12/06/2022 3:16 PM End time: 12/06/2022 3:24 PM Injection made incrementally with aspirations every 5 mL.  Performed by: Personally  Anesthesiologist: Trevor Iha, MD  Additional Notes: Block assessed prior to procedure. Patient tolerated procedure well.

## 2022-12-06 NOTE — H&P (Signed)
Todd Bailey is an 61 y.o. male.   Chief Complaint: Left shoulder pain HPI: Todd Bailey is a 61 y.o. male who presents with long history of left shoulder pain.  Patient had glenohumeral injection 10/17/2022 which gave him about 80% improvement.  States that his range of motion improved and his pain improved.  Describes some increased pain with extreme motion or past the level of comfort.  The injection may be tapering off a little bit at this time.  He works as a Emergency planning/management officer.  Denies any AC joint symptoms..  Has reported some recent popping within the last 2 weeks which is deep in the shoulder.  MRI scan shows significant biceps tendinosis of the biceps tendon in the joint along with intact rotator cuff and minimal to no glenohumeral joint arthritis. Past Medical History:  Diagnosis Date   Arthritis    Elevated coronary artery calcium score 07/2022   96th %'tile   GERD (gastroesophageal reflux disease)    Hypercholesterolemia 08/2020   TLC->imp 12/2020   Hypertension    Obesity, Class I, BMI 30-34.9    Palpitation 04/2020   Zio patch x 3d -->occ PAC and PVC; triggered events correlate with times of PVCs.  Reassured.   Pulmonary nodules    on coronary ca score CT 08/28/22-->f/u CT 3-6 mo (Dr. Jacinto Halim)    Past Surgical History:  Procedure Laterality Date   CHOLECYSTECTOMY Right 09/14/2021   Procedure: LAPAROSCOPIC CHOLECYSTECTOMY WITH INTRAOPERATIVE CHOLANGIOGRAM,;  Surgeon: [name], MD;  Location: [facility];  Service: General;  Laterality: Right;   COLONOSCOPY  approx 2018   ? polyp, unclear on recall time--get records.   coron calcium score     08/27/22: 96th%'tile (Dr. Jacinto Halim)   ESOPHAGEAL DILATION  2001   Dr. Madilyn Fireman.  Had it done twice (GERD-induced stricture)   Heart Rhythm monitoring  04/2020   Zio patch x 3d -->occ PAC and PVC; triggered events correlate with times of PVCs.  Reassured.   LEFT HEART CATH AND CORONARY ANGIOGRAPHY N/A 10/23/2022   Procedure: LEFT HEART  CATH AND CORONARY ANGIOGRAPHY;  Surgeon: Yates Decamp, MD;  Location: [facility];  Service: Cardiovascular;  Laterality: N/A;   TRANSTHORACIC ECHOCARDIOGRAM  09/28/2022   normal    Family History  Problem Relation Age of Onset   Arthritis Mother    Hearing loss Mother    Heart disease Mother    Arthritis Father    Heart disease Father    High Cholesterol Father    High blood pressure Father    Social History:  reports that he quit smoking about 22 years ago. His smoking use included cigarettes. He has a 15.00 pack-year smoking history. He has never used smokeless tobacco. He reports that he does not drink alcohol and does not use drugs.  Allergies: No Known Allergies  Medications Prior to Admission  Medication Sig Dispense Refill   atorvastatin (LIPITOR) 20 MG tablet Take 1 tablet (20 mg total) by mouth daily. (Patient taking differently: Take 20 mg by mouth daily with lunch.) 90 tablet 3   Cholecalciferol (VITAMIN D3) 125 MCG (5000 UT) TABS Take 5,000 Units by mouth in the morning.     HAWTHORN BERRY PO Take 565 mg by mouth in the morning.     losartan (COZAAR) 100 MG tablet Take 1 tablet (100 mg total) by mouth at bedtime. 90 tablet 3   metoprolol succinate (TOPROL-XL) 50 MG 24 hr tablet Take 1 tablet (50 mg total) by mouth  daily with lunch. Take with or immediately following a meal. 90 tablet 3   Omega-3 Fatty Acids (FISH OIL PO) Take 1,000 mg by mouth every evening.     omeprazole (PRILOSEC) 20 MG capsule Take 1 capsule (20 mg total) by mouth daily. (Patient taking differently: Take 20 mg by mouth daily before breakfast.) 90 capsule 1   acetaminophen (TYLENOL) 325 MG tablet Take 650 mg by mouth every 6 (six) hours as needed for mild pain or headache.     aspirin 81 MG chewable tablet Chew 81 mg by mouth every evening.     nitroGLYCERIN (NITROSTAT) 0.4 MG SL tablet Place 1 tablet (0.4 mg total) under the tongue every 5 (five) minutes as needed for up to 25 days for chest pain.  25 tablet 3    No results found for this or any previous visit (from the past 48 hour(s)). No results found.  Review of Systems  Musculoskeletal:  Positive for arthralgias.  All other systems reviewed and are negative.   Blood pressure (!) 130/91, pulse 66, temperature 97.9 F (36.6 C), resp. rate (!) 21, height 5\' 8"  (1.727 m), weight 87.1 kg, SpO2 95 %. Physical Exam Vitals reviewed.  HENT:     Head: Normocephalic.     Nose: Nose normal.     Mouth/Throat:     Mouth: Mucous membranes are moist.  Eyes:     Pupils: Pupils are equal, round, and reactive to light.  Cardiovascular:     Rate and Rhythm: Normal rate.     Pulses: Normal pulses.  Pulmonary:     Effort: Pulmonary effort is normal.  Abdominal:     General: Abdomen is flat.  Musculoskeletal:     Cervical back: Normal range of motion.  Skin:    General: Skin is warm.     Capillary Refill: Capillary refill takes less than 2 seconds.  Neurological:     General: No focal deficit present.     Mental Status: He is alert.  Psychiatric:        Mood and Affect: Mood normal.    Ortho exam demonstrates range of motion on the left of 60/95/160. Rotator cuff strength is intact infraspinatus supraspinatus and subscap muscle testing. Does have positive biceps tendon tenderness positive speeds test and positive O'Brien's testing on the left compared to the right. No coarse grinding or crepitus with internal/external rotation of that left shoulder at 90 degrees of abduction.  No popping around the [facility] joint with passive range of motion of the shoulder including crossed arm adduction.  Assessment/Plan Impression is left shoulder pain with prior MRI scan demonstrating significant intra-articular biceps tendinosis and no rotator cuff tear with tendinosis which is mild to moderate. The fact that the intra-articular injection gave him good relief indicates that his symptoms may very well be coming from the inflamed biceps tendon. Surgical  treatment and nonoperative treatment discussed. Surgical treatment would be arthroscopy with debridement and biceps tenodesis. The expected rehab time is discussed in relation to his job as a [employer]. Do not anticipate having to deal with any type of rotator cuff pathology.  All questions answered.   Emergency planning/management officer, MD 12/06/2022, 4:11 PM

## 2022-12-06 NOTE — Brief Op Note (Signed)
   12/06/2022  6:20 PM  PATIENT:  Todd Bailey  61 y.o. male  PRE-OPERATIVE DIAGNOSIS:  left shoulder biceps tendonitis, bursitis  POST-OPERATIVE DIAGNOSIS:  left shoulder biceps tendonitis, bursitis  PROCEDURE:  Procedure(s): left shoulder arthroscopy, debridement, bursectomy BICEPS TENODESIS  SURGEON:  Surgeon(s): August Saucer, Corrie Mckusick, MD  ASSISTANT: magnant pa  ANESTHESIA:   general  EBL: 10 ml    Total I/O In: 700 [I.V.:500; IV Piggyback:200] Out: -   BLOOD ADMINISTERED: none  DRAINS: none   LOCAL MEDICATIONS USED:  none  SPECIMEN:  No Specimen  COUNTS:  YES  TOURNIQUET:  * No tourniquets in log *  DICTATION: .Other Dictation: Dictation Number [id number]  PLAN OF CARE: Discharge to home after PACU  PATIENT DISPOSITION:  PACU - hemodynamically stable

## 2022-12-07 ENCOUNTER — Telehealth: Payer: Self-pay

## 2022-12-07 NOTE — Telephone Encounter (Signed)
Pt s/p surgery with Dr. August Saucer yesterday left shoulder. States that d/c instructions talk about a CPM machine but he does not have one. He also states that he has the ice machine but does not know how many times a day and for how long he is supposed to be using this and would like a call  back to advise. Cb [phone]

## 2022-12-07 NOTE — Telephone Encounter (Signed)
Can we get him set up for the black brace? He can use ice machine for up to 40 mins at a time and take 30 min breaks in-between sessions. No limit on the number of times he can use it

## 2022-12-07 NOTE — Op Note (Unsigned)
NAME: Todd Bailey, Todd Bailey MEDICAL RECORD NO: [id number] ACCOUNT NO: [id number] DATE OF BIRTH: 04/26/1961 FACILITY: [facility] LOCATION: [facility] PHYSICIAN: Graylin Shiver. August Saucer, MD  Operative Report   DATE OF PROCEDURE: 12/06/2022  PREOPERATIVE DIAGNOSIS:  Left shoulder biceps tendinosis.  POSTOPERATIVE DIAGNOSIS:  Left shoulder biceps tendinosis and degenerative unstable SLAP tear.   PROCEDURE:  Left shoulder arthroscopy with limited debridement and biceps tenodesis.  SURGEON:  Graylin Shiver. August Saucer, MD   ASSISTANT:  Karenann Cai, PA.  INDICATIONS:  This is a 60 year old patient with left shoulder pain and mechanical symptoms, who presents for operative management after explanation of risks and benefits.  DESCRIPTION OF PROCEDURE:  The patient was brought to the operating room where general anesthetic was induced.  Preoperative antibiotics administered.  Timeout was called.  The patient was placed in the beach chair position with head in neutral position.   Left shoulder, arm and hand prescrubbed with alcohol and Betadine, allowed to air dry, prepped with DuraPrep solution and draped in sterile manner.  Ioban used to cover the operative field.  Timeout was called.  Posterior portal was created.  Anterior  portal created under direct visualization.  Diagnostic arthroscopy was performed.  The patient had intact rotator cuff.  Intact glenohumeral articular surfaces except for very small area of chondromalacia near the biceps tendon, which had significant  tendinosis intraarticularly which was consistent with the MRI scanning.  There was also a degenerative unstable superior labral tear.  Using the Arthrocare wand this tear was debrided from the 10 o'clock to 2 o'clock position.  Anterior, inferior,  posterior inferior glenohumeral ligaments intact.  Following biceps tendon release, the instruments were removed.  Portals were closed using 3-0 nylon.  Ioban then used to cover the entire operative field.  3 cm  incision made off the anterolateral margin  of the acromion. Skin and subcutaneous tissue were sharply divided.  Deltoid split measured distance of 4 cm from the anterolateral margin of the acromion. Bursectomy performed.  Impingement not really present.  Biceps tendon was then tenodesed into the  bicipital groove using 2 Arthrex knotless suture anchors oversewn with Vicryl 0 suture x3.  This was done under appropriate tension after placing a FiberLink suture into the tendon.  A thorough irrigation was then performed.  Deltoid split closed using  #1 Vicryl suture followed by interrupted inverted 2-0 Vicryl suture and 3-0 Monocryl with Steri-Strips.  Impervious dressings was applied.  The patient tolerated the procedure well without immediate complication.  Luke's assistance was required at all  times for retraction, opening, closing, mobilization of tissue.  His assistance was a medical necessity.     SUJ D: 12/06/2022 6:24:06 pm T: 12/07/2022 12:35:00 am  JOB: [id number]/ [id number]

## 2022-12-08 ENCOUNTER — Encounter (HOSPITAL_COMMUNITY): Payer: Self-pay | Admitting: Orthopedic Surgery

## 2022-12-08 DIAGNOSIS — M7522 Bicipital tendinitis, left shoulder: Secondary | ICD-10-CM

## 2022-12-08 DIAGNOSIS — S43432A Superior glenoid labrum lesion of left shoulder, initial encounter: Secondary | ICD-10-CM

## 2022-12-10 ENCOUNTER — Encounter (HOSPITAL_COMMUNITY): Payer: Self-pay | Admitting: Orthopedic Surgery

## 2022-12-10 ENCOUNTER — Telehealth: Payer: Self-pay | Admitting: Orthopedic Surgery

## 2022-12-10 NOTE — Telephone Encounter (Signed)
Error

## 2022-12-10 NOTE — Telephone Encounter (Signed)
Pt called and would like to speak more about below

## 2022-12-11 ENCOUNTER — Encounter: Payer: Self-pay | Admitting: Orthopedic Surgery

## 2022-12-11 DIAGNOSIS — M7522 Bicipital tendinitis, left shoulder: Secondary | ICD-10-CM | POA: Diagnosis not present

## 2022-12-11 NOTE — Telephone Encounter (Signed)
I called around noon

## 2022-12-11 NOTE — Telephone Encounter (Signed)
Spoke with him

## 2022-12-14 ENCOUNTER — Ambulatory Visit (INDEPENDENT_AMBULATORY_CARE_PROVIDER_SITE_OTHER): Payer: BC Managed Care – PPO | Admitting: Orthopedic Surgery

## 2022-12-14 DIAGNOSIS — M67814 Other specified disorders of tendon, left shoulder: Secondary | ICD-10-CM

## 2022-12-16 ENCOUNTER — Encounter: Payer: Self-pay | Admitting: Orthopedic Surgery

## 2022-12-16 NOTE — Progress Notes (Signed)
Post-Op Visit Note   Patient: Todd Bailey           Date of Birth: [birth year]           MRN: [id number] Visit Date: 12/14/2022 PCP: Jeoffrey Massed, MD   Assessment & Plan:  Chief Complaint:  Chief Complaint  Patient presents with   Left Shoulder - Routine Post Op   Visit Diagnoses:  1. Biceps tendinosis of left shoulder     Plan: Todd Bailey is a 61 year old patient with left shoulder pain status post left shoulder arthroscopy biceps tenodesis and bursectomy.  On exam portal incisions are intact and no sutures were removed.  I like for him to discontinue the sling in 1 week.  Needs to start physical therapy 1 time a week for 4 weeks starting the week of Christmas.  Passive range of motion active assisted range of motion of the left shoulder with no biceps resistance exercises.  Come back in 3 weeks and we can discuss return to work at that time.  Follow-Up Instructions: No follow-ups on file.   Orders:  No orders of the defined types were placed in this encounter.  No orders of the defined types were placed in this encounter.   Imaging: No results found.  PMFS History: Patient Active Problem List   Diagnosis Date Noted   Biceps tendinitis of left shoulder 12/08/2022   Degenerative superior labral anterior-to-posterior (SLAP) tear of left shoulder 12/08/2022   Coronary artery disease of native artery of native heart with stable angina pectoris (HCC)    Cholelithiasis 09/12/2021   Cholecystitis 09/12/2021   H/O adenomatous polyp of colon 04/05/2016   De Quervain's tenosynovitis, right 12/27/2014   Arthritis 12/06/2014   Essential hypertension 12/06/2014   GERD (gastroesophageal reflux disease) 12/06/2014   Hyperlipemia 12/06/2014   Prediabetes 12/06/2014   Past Medical History:  Diagnosis Date   Arthritis    Elevated coronary artery calcium score 07/2022   96th %'tile   GERD (gastroesophageal reflux disease)    Hypercholesterolemia 08/2020   TLC->imp 12/2020    Hypertension    Obesity, Class I, BMI 30-34.9    Palpitation 04/2020   Zio patch x 3d -->occ PAC and PVC; triggered events correlate with times of PVCs.  Reassured.   Pulmonary nodules    on coronary ca score CT 08/28/22-->f/u CT 3-6 mo (Dr. Jacinto Halim)    Family History  Problem Relation Age of Onset   Arthritis Mother    Hearing loss Mother    Heart disease Mother    Arthritis Father    Heart disease Father    High Cholesterol Father    High blood pressure Father     Past Surgical History:  Procedure Laterality Date   BICEPT TENODESIS Left 12/06/2022   Procedure: BICEPS TENODESIS;  Surgeon: Cammy Copa, MD;  Location: [facility] OR;  Service: Orthopedics;  Laterality: Left;   CHOLECYSTECTOMY Right 09/14/2021   Procedure: LAPAROSCOPIC CHOLECYSTECTOMY WITH INTRAOPERATIVE CHOLANGIOGRAM,;  Surgeon: [name], MD;  Location: [facility] OR[facility];  Service: General;  Laterality: Right;   COLONOSCOPY  approx 2018   ? polyp, unclear on recall time--get records.   coron calcium score     08/27/22: 96th%'tile (Dr. Jacinto Halim)   ESOPHAGEAL DILATION  2001   Dr. Madilyn Fireman.  Had it done twice (GERD-induced stricture)   Heart Rhythm monitoring  04/2020   Zio patch x 3d -->occ PAC and PVC; triggered events correlate with times of PVCs.  Reassured.   LEFT HEART CATH  AND CORONARY ANGIOGRAPHY N/A 10/23/2022   Procedure: LEFT HEART CATH AND CORONARY ANGIOGRAPHY;  Surgeon: Adrian Prows, MD;  Location: [place] [facility];  Service: Cardiovascular;  Laterality: N/A;   SHOULDER ARTHROSCOPY Left 12/06/2022   Procedure: left shoulder arthroscopy, debridement, bursectomy;  Surgeon: Meredith Pel, MD;  Location: [place];  Service: Orthopedics;  Laterality: Left;   TRANSTHORACIC ECHOCARDIOGRAM  09/28/2022   normal   Social History   Occupational History   Not on file  Tobacco Use   Smoking status: Former    Packs/day: 1.00    Years: 15.00    Total pack years: 15.00    Types: Cigarettes    Quit date: 2001    Years  since quitting: 22.9   Smokeless tobacco: Never  Vaping Use   Vaping Use: Never used  Substance and Sexual Activity   Alcohol use: Never   Drug use: Never   Sexual activity: Not on file

## 2022-12-17 ENCOUNTER — Other Ambulatory Visit: Payer: Self-pay

## 2022-12-17 DIAGNOSIS — M65812 Other synovitis and tenosynovitis, left shoulder: Secondary | ICD-10-CM

## 2022-12-17 DIAGNOSIS — M25512 Pain in left shoulder: Secondary | ICD-10-CM

## 2022-12-17 DIAGNOSIS — M67814 Other specified disorders of tendon, left shoulder: Secondary | ICD-10-CM

## 2022-12-25 NOTE — Therapy (Signed)
OUTPATIENT PHYSICAL THERAPY SHOULDER EVALUATION   Patient Name: Todd Bailey MRN: [id number] DOB:1961/09/23, 61 y.o., male Today's Date: 12/27/2022  END OF SESSION:  PT End of Session - 12/27/22 1431     Visit Number 1    Number of Visits 21    Date for PT Re-Evaluation 02/21/23    Authorization Type BCBS    PT Start Time 1432    PT Stop Time 1526    PT Time Calculation (min) 54 min    Activity Tolerance Patient tolerated treatment well;No increased pain    Behavior During Therapy [facility] for tasks assessed/performed             Past Medical History:  Diagnosis Date   Arthritis    Elevated coronary artery calcium score 07/2022   96th %'tile   GERD (gastroesophageal reflux disease)    Hypercholesterolemia 08/2020   TLC->imp 12/2020   Hypertension    Obesity, Class I, BMI 30-34.9    Palpitation 04/2020   Zio patch x 3d -->occ PAC and PVC; triggered events correlate with times of PVCs.  Reassured.   Pulmonary nodules    on coronary ca score CT 08/28/22-->f/u CT 3-6 mo (Dr. Jacinto Halim)   Past Surgical History:  Procedure Laterality Date   BICEPT TENODESIS Left 12/06/2022   Procedure: BICEPS TENODESIS;  Surgeon: Cammy Copa, MD;  Location: [facility] OR;  Service: Orthopedics;  Laterality: Left;   CHOLECYSTECTOMY Right 09/14/2021   Procedure: LAPAROSCOPIC CHOLECYSTECTOMY WITH INTRAOPERATIVE CHOLANGIOGRAM,;  Surgeon: [name], MD;  Location: [facility];  Service: General;  Laterality: Right;   COLONOSCOPY  approx 2018   ? polyp, unclear on recall time--get records.   coron calcium score     08/27/22: 96th%'tile (Dr. Jacinto Halim)   ESOPHAGEAL DILATION  2001   Dr. Madilyn Fireman.  Had it done twice (GERD-induced stricture)   Heart Rhythm monitoring  04/2020   Zio patch x 3d -->occ PAC and PVC; triggered events correlate with times of PVCs.  Reassured.   LEFT HEART CATH AND CORONARY ANGIOGRAPHY N/A 10/23/2022   Procedure: LEFT HEART CATH AND CORONARY ANGIOGRAPHY;  Surgeon: Yates Decamp, MD;   Location: [facility];  Service: Cardiovascular;  Laterality: N/A;   SHOULDER ARTHROSCOPY Left 12/06/2022   Procedure: left shoulder arthroscopy, debridement, bursectomy;  Surgeon: Cammy Copa, MD;  Location: [facility] OR;  Service: Orthopedics;  Laterality: Left;   TRANSTHORACIC ECHOCARDIOGRAM  09/28/2022   normal   Patient Active Problem List   Diagnosis Date Noted   Biceps tendinitis of left shoulder 12/08/2022   Degenerative superior labral anterior-to-posterior (SLAP) tear of left shoulder 12/08/2022   Coronary artery disease of native artery of native heart with stable angina pectoris (HCC)    Cholelithiasis 09/12/2021   Cholecystitis 09/12/2021   H/O adenomatous polyp of colon 04/05/2016   De Quervain's tenosynovitis, right 12/27/2014   Arthritis 12/06/2014   Essential hypertension 12/06/2014   GERD (gastroesophageal reflux disease) 12/06/2014   Hyperlipemia 12/06/2014   Prediabetes 12/06/2014    PCP: Jeoffrey Massed, MD  REFERRING PROVIDER: Cammy Copa, MD  REFERRING DIAG: [phone] (ICD-10-CM) - Biceps tendinosis of left shoulder M25.512 (ICD-10-CM) - Left shoulder pain, unspecified chronicity M65.812 (ICD-10-CM) - Synovitis of left shoulder  THERAPY DIAG:  Left shoulder pain, unspecified chronicity  Stiffness of left shoulder, not elsewhere classified  Muscle weakness (generalized)  Abnormal posture  Rationale for Evaluation and Treatment: Rehabilitation  ONSET DATE: 12/06/22 L shoulder arthroscopy and biceps tenodesis  SUBJECTIVE:  SUBJECTIVE STATEMENT: Pt states since surgery he has been doing relatively well. Hasn't been needing prescription pain meds since first few days per pt report. Was sent home w/ ice machine and cloth sling. Pt states that he talked with  surgeon after he got home and was advised to use "bionic sling" for ROM, and has been out of regular sling for about a week, has been moving shoulder some as needed but avoiding significant reaching/AROM.  Denies any N/T or UE referral, minimal pain levels overall. Does note an episode where he instinctively reached out to catch falling jar and had some increased transient pain, now just sore.  Pt reports difficulty with daily activities d/t pain and restrictions, modifying activities, avoiding lifting. Has been able to perform ADLs with modifications. Describes himself as very active typically.   PERTINENT HISTORY: GERD, HTN, hx palpitations, pulmonary nodules  PAIN:  Are you having pain: no pain, just soreness Location: L shoulder How would you describe your pain? sharp Best in past week: 0/10 Worst in past week: [id number] with certain movements Aggravating factors: movements, Easing factors: ice, tylenol   PRECAUTIONS: s/p L shoulder arthroscopic debridement with biceps tenodesis "Passive range of motion active assisted range of motion of the left shoulder with no biceps resistance exercises." Per 12/14/22 MD note  WEIGHT BEARING RESTRICTIONS: No  FALLS:  Has patient fallen in last 6 months? No   LIVING ENVIRONMENT: 2 story home, main level livable. 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, desk work and up on feet  PLOF: Independent, get back to playing hockey  PATIENT GOALS: reduce pain, return to PLOF - get back to playing hockey  NEXT MD VISIT: 01/07/23  OBJECTIVE:   DIAGNOSTIC FINDINGS:  12/06/22 shoulder arthroscopy with biceps tenodesis  PATIENT SURVEYS:  FOTO 38%, 66 predicted  COGNITION: Overall cognitive status: Within functional limits for tasks assessed     SENSATION: Light touch grossly intact B UE, pt denies any sensory  issues  POSTURE: Slightly guarded posture L UE, tendency to hold in [place], forward head  UPPER EXTREMITY ROM:  A/PROM Right eval Left eval  Shoulder flexion 167/NT NT/92s  Shoulder abduction 170/NT NT/60s  Shoulder internal rotation    Shoulder external rotation  NT/18 deg s  Elbow flexion  Full elbow ROM painless but stiff sensation  Elbow extension    Wrist flexion    Wrist extension     (Blank rows = not tested) (Key: WFL = within functional limits not formally assessed, * = concordant pain, s = stiffness/stretching sensation, NT = not tested)  Comments: AROM NT given recency of surgery, MD note recommending AAROM/PROM at present  UPPER EXTREMITY MMT:  MMT Right eval Left eval  Shoulder flexion    Shoulder extension    Shoulder abduction    Shoulder extension    Shoulder internal rotation    Shoulder external rotation    Elbow flexion    Elbow extension    Grip strength     (Blank rows = not tested)  Comments: NT on eval given recency of surgery  SHOULDER SPECIAL TESTS: NT given recency of surgery  JOINT MOBILITY TESTING:  NT  PALPATION:  No overt TTP throughout shoulder girdle, mild muscular tightness UT/LS but asympomatic  OBSERVATION:   Visually inspected surgical site w/ pt verbal consent. Appears WNL, healing well, no erythema or significant swelling. No drainage or  excessive warmth  TODAY'S TREATMENT:                                                                                                                                         [facility] Adult PT Treatment:                                                DATE: 12/27/22 Therapeutic Exercise: Scapular retraction x10, cues for form/HEP, emphasis on avoiding shoulder extension/ER AAROM elbow flex/ext x10, cues for appropriate performance/HEP Table shoulder flexion walkbacks x10, cues for appropriate performance/ROM, education for safety/HEP, emphasis on appropriate support and avoiding overt WB  Self  Care: Time spent with pt education re: sleeping setup and appropriate pillow support, mitigating GH extension, activity modification/safety and monitoring symptoms with activity   PATIENT EDUCATION: Education details: Pt education on PT impairments, prognosis, and POC. Informed consent. Rationale for interventions, safe/appropriate HEP performance. Post surgical healing, activity modification and self care as above Person educated: Patient Education method: Explanation, Demonstration, Tactile cues, Verbal cues, and Handouts Education comprehension: verbalized understanding, returned demonstration, verbal cues required, tactile cues required, and needs further education    HOME EXERCISE PROGRAM: Access Code: [email or web address] URL: [email or web address] Date: 12/27/2022 Prepared by: Enis Slipper  Program Notes scapular retraction (shoulder blade exercise) - no shoulder extension/ERcounter walk back - comfortable ROM  Exercises - Seated Scapular Retraction  - 1 x daily - 7 x weekly - 3 sets - 10 reps - Seated Elbow Flexion AAROM  - 1 x daily - 7 x weekly - 3 sets - 10 reps - Standing 'L' Stretch at Counter  - 1 x daily - 7 x weekly - 3 sets - 10 reps  ASSESSMENT:  CLINICAL IMPRESSION: Patient is a pleasant 61 y.o. gentleman who was seen today for physical therapy evaluation and treatment for L shoulder pain s/p arthroscopy w/ biceps tenodesis on 12/06/22, per referring MD note from 12/14/22: "Passive range of motion active assisted range of motion of the left shoulder with no biceps resistance exercises." Pt arrives and reports doing well overall since surgery, minimal pain. Does report significant modification of daily activities and increased difficulty since surgery, describes himself as typically being quite active. On examination pt demonstrates deficits in [facility] mobility as expected post op but tolerates exam well. Deferred AROM/MMT given recency of surgery. HEP performed as above w/  good tolerance, increased time w/ education/cues for safety with home performance. Pt tolerates session well and denies any increases in pain throughout, no adverse events. Recommend skilled PT to address aforementioned deficits and maximize post surgical recovery to return to PLOF. Pt departs today's session in no acute distress, all voiced questions/concerns addressed appropriately from PT perspective.     OBJECTIVE IMPAIRMENTS: decreased activity tolerance, decreased  mobility, decreased ROM, decreased strength, hypomobility, increased edema, impaired flexibility, impaired UE functional use, postural dysfunction, and pain.   ACTIVITY LIMITATIONS: carrying, lifting, sitting, sleeping, bathing, dressing, reach over head, and hygiene/grooming  PARTICIPATION LIMITATIONS: meal prep, cleaning, laundry, driving, community activity, and occupation  PERSONAL FACTORS: 3+ comorbidities: HTN, pulmonary nodules, hx palpitations  are also affecting patient's functional outcome.   REHAB POTENTIAL: Good  CLINICAL DECISION MAKING: Stable/uncomplicated  EVALUATION COMPLEXITY: Low   GOALS: Goals reviewed with patient? No  SHORT TERM GOALS: Target date: 01/31/2023    Pt will demonstrate appropriate understanding and performance of initially prescribed HEP in order to facilitate improved independence with management of symptoms.  Baseline: HEP provided on eval Goal status: INITIAL   2. Pt will score greater than or equal to 51% on FOTO in order to demonstrate improved perception of function due to symptoms.  Baseline: 38%  Goal status: INITIAL   LONG TERM GOALS: Target date: 03/07/2023 Pt will score 66% on FOTO in order to demonstrate improved perception of function due to symptoms. Baseline: 38% Goal status: INITIAL  2.  Pt will demonstrate at least 150 degrees of L active shoulder elevation in order to demonstrate improved tolerance to functional movement patterns such as upper body dressing and  reaching overhead.  Baseline: see ROM chart above - AROM NT on eval given recency of surgery Goal status: INITIAL  3.  Pt will demonstrate at least 4+/5 shoulder flexion/abduction MMT for improved symmetry of UE strength and improved tolerance to functional movements.  Baseline: see MMT chart above - deferred on eval given recency of surgery Goal status: INITIAL  4. Pt will report/demonstrate ability to lift up to 10 lbs with less than 2 point increase in pain on NPS in order to indicative improved tolerance/independence with heavier activities around the home.   Baseline: NT given recency of surgery  Goal status: INITIAL   PLAN:  PT FREQUENCY: 2x/week  PT DURATION: 10 weeks  PLANNED INTERVENTIONS: Therapeutic exercises, Therapeutic activity, Neuromuscular re-education, Balance training, Gait training, Patient/Family education, Self Care, Joint mobilization, Dry Needling, Electrical stimulation, Spinal mobilization, Cryotherapy, Moist heat, scar mobilization, Taping, Vasopneumatic device, Manual therapy, and Re-evaluation; above unless contraindicated  PLAN FOR NEXT SESSION: Progress ROM exercises as able/appropriate, review HEP. Manual PROM as able/appropriate     Leeroy Cha PT, DPT 12/27/2022 4:05 PM

## 2022-12-27 ENCOUNTER — Other Ambulatory Visit: Payer: Self-pay

## 2022-12-27 ENCOUNTER — Ambulatory Visit (INDEPENDENT_AMBULATORY_CARE_PROVIDER_SITE_OTHER): Payer: BC Managed Care – PPO | Admitting: Physical Therapy

## 2022-12-27 ENCOUNTER — Encounter: Payer: Self-pay | Admitting: Physical Therapy

## 2022-12-27 DIAGNOSIS — M6281 Muscle weakness (generalized): Secondary | ICD-10-CM | POA: Diagnosis not present

## 2022-12-27 DIAGNOSIS — R293 Abnormal posture: Secondary | ICD-10-CM | POA: Diagnosis not present

## 2022-12-27 DIAGNOSIS — M25612 Stiffness of left shoulder, not elsewhere classified: Secondary | ICD-10-CM

## 2022-12-27 DIAGNOSIS — M25512 Pain in left shoulder: Secondary | ICD-10-CM | POA: Diagnosis not present

## 2023-01-02 ENCOUNTER — Ambulatory Visit (INDEPENDENT_AMBULATORY_CARE_PROVIDER_SITE_OTHER): Payer: BC Managed Care – PPO | Admitting: Rehabilitative and Restorative Service Providers"

## 2023-01-02 ENCOUNTER — Encounter: Payer: Self-pay | Admitting: Rehabilitative and Restorative Service Providers"

## 2023-01-02 DIAGNOSIS — R293 Abnormal posture: Secondary | ICD-10-CM | POA: Diagnosis not present

## 2023-01-02 DIAGNOSIS — M25512 Pain in left shoulder: Secondary | ICD-10-CM | POA: Diagnosis not present

## 2023-01-02 DIAGNOSIS — M6281 Muscle weakness (generalized): Secondary | ICD-10-CM

## 2023-01-02 DIAGNOSIS — M25612 Stiffness of left shoulder, not elsewhere classified: Secondary | ICD-10-CM | POA: Diagnosis not present

## 2023-01-02 NOTE — Therapy (Signed)
OUTPATIENT PHYSICAL THERAPY TREATMENT   Patient Name: Todd Bailey MRN: [id number] DOB:28-Jul-1961, 62 y.o., male Today's Date: 01/02/2023  END OF SESSION:  PT End of Session - 01/02/23 1147     Visit Number 2    Number of Visits 21    Date for PT Re-Evaluation 02/21/23    Authorization Type BC[employer]    PT Start Time 1146    PT Stop Time 1224    PT Time Calculation (min) 38 min    Activity Tolerance Patient tolerated treatment well    Behavior During Therapy [facility] for tasks assessed/performed              Past Medical History:  Diagnosis Date   Arthritis    Elevated coronary artery calcium score 07/2022   96th %'tile   GERD (gastroesophageal reflux disease)    Hypercholesterolemia 08/2020   TLC->imp 12/2020   Hypertension    Obesity, Class I, BMI 30-34.9    Palpitation 04/2020   Zio patch x 3d -->occ PAC and PVC; triggered events correlate with times of PVCs.  Reassured.   Pulmonary nodules    on coronary ca score CT 08/28/22-->f/u CT 3-6 mo (Dr. Jacinto Halim)   Past Surgical History:  Procedure Laterality Date   BICEPT TENODESIS Left 12/06/2022   Procedure: BICEPS TENODESIS;  Surgeon: Cammy Copa, MD;  Location: [facility] OR;  Service: Orthopedics;  Laterality: Left;   CHOLECYSTECTOMY Right 09/14/2021   Procedure: LAPAROSCOPIC CHOLECYSTECTOMY WITH INTRAOPERATIVE CHOLANGIOGRAM,;  Surgeon: [name], MD;  Location: [facility] ORS;  Service: General;  Laterality: Right;   COLONOSCOPY  approx 2018   ? polyp, unclear on recall time--get records.   coron calcium score     08/27/22: 96th%'tile (Dr. Jacinto Halim)   ESOPHAGEAL DILATION  2001   Dr. Madilyn Fireman.  Had it done twice (GERD-induced stricture)   Heart Rhythm monitoring  04/2020   Zio patch x 3d -->occ PAC and PVC; triggered events correlate with times of PVCs.  Reassured.   LEFT HEART CATH AND CORONARY ANGIOGRAPHY N/A 10/23/2022   Procedure: LEFT HEART CATH AND CORONARY ANGIOGRAPHY;  Surgeon: Yates Decamp, MD;  Location: [facility];  Service: Cardiovascular;  Laterality: N/A;   SHOULDER ARTHROSCOPY Left 12/06/2022   Procedure: left shoulder arthroscopy, debridement, bursectomy;  Surgeon: Cammy Copa, MD;  Location: [facility] OR;  Service: Orthopedics;  Laterality: Left;   TRANSTHORACIC ECHOCARDIOGRAM  09/28/2022   normal   Patient Active Problem List   Diagnosis Date Noted   Biceps tendinitis of left shoulder 12/08/2022   Degenerative superior labral anterior-to-posterior (SLAP) tear of left shoulder 12/08/2022   Coronary artery disease of native artery of native heart with stable angina pectoris (HCC)    Cholelithiasis 09/12/2021   Cholecystitis 09/12/2021   H/O adenomatous polyp of colon 04/05/2016   De Quervain's tenosynovitis, right 12/27/2014   Arthritis 12/06/2014   Essential hypertension 12/06/2014   GERD (gastroesophageal reflux disease) 12/06/2014   Hyperlipemia 12/06/2014   Prediabetes 12/06/2014    PCP: Jeoffrey Massed, MD  REFERRING PROVIDER: Cammy Copa, MD  REFERRING DIAG: [phone] (ICD-10-CM) - Biceps tendinosis of left shoulder M25.512 (ICD-10-CM) - Left shoulder pain, unspecified chronicity M65.812 (ICD-10-CM) - Synovitis of left shoulder  THERAPY DIAG:  Left shoulder pain, unspecified chronicity  Stiffness of left shoulder, not elsewhere classified  Muscle weakness (generalized)  Abnormal posture  Rationale for Evaluation and Treatment: Rehabilitation  ONSET DATE: 12/06/22 Lt shoulder arthroscopy and biceps tenodesis  SUBJECTIVE:  SUBJECTIVE STATEMENT: Pt indicated soreness not pain.  Did some dishes that may have created more soreness.   PERTINENT HISTORY: GERD, HTN, hx palpitations, pulmonary nodules  PAIN:   Location: Lt shoulder How would you describe your pain? sharp Best in  past week: 0/10 Worst in past week: 5/95 with certain movements Aggravating factors: movements, Easing factors: ice, tylenol   PRECAUTIONS: s/p Lt shoulder arthroscopic debridement with biceps tenodesis "Passive range of motion active assisted range of motion of the left shoulder with no biceps resistance exercises." Per 12/14/22 MD note  WEIGHT BEARING RESTRICTIONS: No  FALLS:  Has patient fallen in last 6 months? No   LIVING ENVIRONMENT: 2 story home, main level livable. 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, desk work and up on feet  PLOF: Independent, get back to playing hockey  PATIENT GOALS: reduce pain, return to PLOF - get back to playing hockey  NEXT MD VISIT: 01/07/23  OBJECTIVE:   DIAGNOSTIC FINDINGS:  12/27/2022 12/06/22 shoulder arthroscopy with biceps tenodesis  PATIENT SURVEYS:  12/27/2022 FOTO 38%, 66 predicted  COGNITION: 12/27/2022 Overall cognitive status: Within functional limits for tasks assessed     SENSATION: 12/27/2022 Light touch grossly intact B UE, pt denies any sensory issues  POSTURE: 12/27/2022 Slightly guarded posture L UE, tendency to hold in [facility] IR, forward head  UPPER EXTREMITY ROM:  A/PROM Right 12/27/2022 Left 12/27/2022  Shoulder flexion 167/NT NT/92s  Shoulder abduction 170/NT NT/60s  Shoulder internal rotation    Shoulder external rotation  NT/18 deg s  Elbow flexion  Full elbow ROM painless but stiff sensation  Elbow extension    Wrist flexion    Wrist extension     (Blank rows = not tested) (Key: WFL = within functional limits not formally assessed, * = concordant pain, s = stiffness/stretching sensation, NT = not tested)  Comments: AROM NT given recency of surgery, MD note recommending AAROM/PROM at present  UPPER EXTREMITY MMT:  MMT Right eval Left eval  Shoulder flexion    Shoulder extension     Shoulder abduction    Shoulder extension    Shoulder internal rotation    Shoulder external rotation    Elbow flexion    Elbow extension    Grip strength     (Blank rows = not tested)  Comments: NT on eval given recency of surgery  SHOULDER SPECIAL TESTS: 12/27/2022 NT given recency of surgery  JOINT MOBILITY TESTING:  12/27/2022 NT  PALPATION:  12/27/2022 No overt TTP throughout shoulder girdle, mild muscular tightness UT/LS but asympomatic  OBSERVATION:  12/27/2022  Visually inspected surgical site w/ pt verbal consent. Appears WNL, healing well, no erythema or significant swelling. No drainage or excessive warmth   TODAY'S TREATMENT:                                                                                              DATE: 01/02/2023 Therapeutic Exercise: Supine passive Lt shoulder 2 x 10 with Rt arm lifting, movement to tolerance (added to  HEP) Supine wand ER to tolerance PROM c wand 5 sec hold x 10 c towel under arm (added to HEP) Seated pendulum Lt shoulder c Rt arm under elbow all directions x 10 (added to HEP) Review of existing HEP for techniques.   Manual: Supine Lt shoulder PROM, g2-g3 inferior jt mobs in all planes, Posterior glide  TODAY'S TREATMENT:                                                                                              DATE: 12/27/22 Therapeutic Exercise: Scapular retraction x10, cues for form/HEP, emphasis on avoiding shoulder extension/ER AAROM elbow flex/ext x10, cues for appropriate performance/HEP Table shoulder flexion walkbacks x10, cues for appropriate performance/ROM, education for safety/HEP, emphasis on appropriate support and avoiding overt WB  Self Care: Time spent with pt education re: sleeping setup and appropriate pillow support, mitigating GH extension, activity modification/safety and monitoring symptoms with activity   PATIENT EDUCATION: Education details: Pt education on PT impairments, prognosis, and  POC. Informed consent. Rationale for interventions, safe/appropriate HEP performance. Post surgical healing, activity modification and self care as above Person educated: Patient Education method: Explanation, Demonstration, Tactile cues, Verbal cues, and Handouts Education comprehension: verbalized understanding, returned demonstration, verbal cues required, tactile cues required, and needs further education    HOME EXERCISE PROGRAM: Access Code: [email or web address] URL: [email or web address] Date: 01/02/2023 Prepared by: Chyrel Masson  Program Notes scapular retraction (shoulder blade exercise) - no shoulder extension/ERcounter walk back - comfortable ROM  Exercises - Seated Scapular Retraction  - 2-3 x daily - 7 x weekly - 1 sets - 10 reps - Seated Elbow Flexion AAROM  - 2-3 x daily - 7 x weekly - 1 sets - 10 reps - Standing 'L' Stretch at Counter  - 1 x daily - 7 x weekly - 3 sets - 10 reps - Supine Shoulder Flexion PROM  - 2-3 x daily - 7 x weekly - 1-2 sets - 10 reps - 5 hold - Supine Shoulder External Rotation with Dowel  - 2-3 x daily - 7 x weekly - 1 sets - 10 reps - 5 hold - Standing Circular Shoulder Pendulum Supported with Arm Bent (Mirrored)  - 2-3 x daily - 7 x weekly - 1-2 sets - 10 reps  ASSESSMENT:  CLINICAL IMPRESSION: Fairly normal progression at this time c end range limitations due to pain and stiffness typical for after surgery care.  Plan to continue to improve passive and AAROM mobility until strengthening allowed.   OBJECTIVE IMPAIRMENTS: decreased activity tolerance, decreased mobility, decreased ROM, decreased strength, hypomobility, increased edema, impaired flexibility, impaired UE functional use, postural dysfunction, and pain.   ACTIVITY LIMITATIONS: carrying, lifting, sitting, sleeping, bathing, dressing, reach over head, and hygiene/grooming  PARTICIPATION LIMITATIONS: meal prep, cleaning, laundry, driving, community activity, and  occupation  PERSONAL FACTORS: 3+ comorbidities: HTN, pulmonary nodules, hx palpitations  are also affecting patient's functional outcome.   REHAB POTENTIAL: Good  CLINICAL DECISION MAKING: Stable/uncomplicated  EVALUATION COMPLEXITY: Low   GOALS: Goals reviewed with patient? No  SHORT TERM GOALS: Target date: 01/31/2023    Pt will demonstrate appropriate understanding  and performance of initially prescribed HEP in order to facilitate improved independence with management of symptoms.  Baseline: HEP provided on eval Goal status: on going 01/02/2023   2. Pt will score greater than or equal to 51% on FOTO in order to demonstrate improved perception of function due to symptoms.  Baseline: 38%  Goal status: on going 01/02/2023   LONG TERM GOALS: Target date: 03/07/2023 Pt will score 66% on FOTO in order to demonstrate improved perception of function due to symptoms. Baseline: 38% Goal status: on going 01/02/2023  2.  Pt will demonstrate at least 150 degrees of L active shoulder elevation in order to demonstrate improved tolerance to functional movement patterns such as upper body dressing and reaching overhead.  Baseline: see ROM chart above - AROM NT on eval given recency of surgery Goal status: on going 01/02/2023  3.  Pt will demonstrate at least 4+/5 shoulder flexion/abduction MMT for improved symmetry of UE strength and improved tolerance to functional movements.  Baseline: see MMT chart above - deferred on eval given recency of surgery Goal status: on going 01/02/2023  4. Pt will report/demonstrate ability to lift up to 10 lbs with less than 2 point increase in pain on NPS in order to indicative improved tolerance/independence with heavier activities around the home.   Baseline: NT given recency of surgery  Goal status: on going 01/02/2023   PLAN:  PT FREQUENCY: 2x/week  PT DURATION: 10 weeks  PLANNED INTERVENTIONS: Therapeutic exercises, Therapeutic activity, Neuromuscular  re-education, Balance training, Gait training, Patient/Family education, Self Care, Joint mobilization, Dry Needling, Electrical stimulation, Spinal mobilization, Cryotherapy, Moist heat, scar mobilization, Taping, Vasopneumatic device, Manual therapy, and Re-evaluation; above unless contraindicated  PLAN FOR NEXT SESSION: PROM, AAROM , manual to improve.    Scot Jun, PT, DPT, OCS, ATC 01/02/23  12:31 PM

## 2023-01-07 ENCOUNTER — Ambulatory Visit (INDEPENDENT_AMBULATORY_CARE_PROVIDER_SITE_OTHER): Payer: BC Managed Care – PPO | Admitting: Rehabilitative and Restorative Service Providers"

## 2023-01-07 ENCOUNTER — Encounter: Payer: Self-pay | Admitting: Rehabilitative and Restorative Service Providers"

## 2023-01-07 ENCOUNTER — Encounter: Payer: Self-pay | Admitting: Orthopedic Surgery

## 2023-01-07 ENCOUNTER — Ambulatory Visit (INDEPENDENT_AMBULATORY_CARE_PROVIDER_SITE_OTHER): Payer: BC Managed Care – PPO | Admitting: Orthopedic Surgery

## 2023-01-07 DIAGNOSIS — R293 Abnormal posture: Secondary | ICD-10-CM

## 2023-01-07 DIAGNOSIS — M25512 Pain in left shoulder: Secondary | ICD-10-CM | POA: Diagnosis not present

## 2023-01-07 DIAGNOSIS — M25612 Stiffness of left shoulder, not elsewhere classified: Secondary | ICD-10-CM | POA: Diagnosis not present

## 2023-01-07 DIAGNOSIS — M67814 Other specified disorders of tendon, left shoulder: Secondary | ICD-10-CM

## 2023-01-07 DIAGNOSIS — M6281 Muscle weakness (generalized): Secondary | ICD-10-CM | POA: Diagnosis not present

## 2023-01-07 NOTE — Progress Notes (Signed)
Post-Op Visit Note   Patient: Todd Bailey           Date of Birth: 1961-04-12           MRN: [id number] Visit Date: 01/07/2023 PCP: Tammi Sou, MD   Assessment & Plan:  Chief Complaint:  Chief Complaint  Patient presents with   Left Shoulder - Routine Post Op    status post left shoulder arthroscopy biceps tenodesis and bursectomy   Visit Diagnoses:  1. Biceps tendinosis of left shoulder     Plan: Todd Bailey is a 62 year old patient underwent left shoulder arthroscopy with biceps tenodesis a month ago.  2 visits in physical therapy.  Getting better every day.  Range of motion is 20/85/110.  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  Plan at this time is 6-week return.  No lifting with that left arm for the next 2 weeks.  Weeks 2-4 from now he is okay to do lifting of 5 pounds.  Weeks 4-6 from now he is okay to lift 10 pounds with the left arm.  Come back in 6 weeks for clinical recheck and decision about returning to work.  He does have a little bit of shoulder stiffness that we need to work out with home exercise program and therapy.  He is not really ready to return for work yet with his current range of motion and lifting restrictions.  Follow-Up Instructions: No follow-ups on file.   Orders:  No orders of the defined types were placed in this encounter.  No orders of the defined types were placed in this encounter.   Imaging: No results found.  PMFS History: Patient Active Problem List   Diagnosis Date Noted   Biceps tendinitis of left shoulder 12/08/2022   Degenerative superior labral anterior-to-posterior (SLAP) tear of left shoulder 12/08/2022   Coronary artery disease of native artery of native heart with stable angina pectoris ([place])    Cholelithiasis 09/12/2021   Cholecystitis 09/12/2021   H/O adenomatous polyp of colon 04/05/2016   De Quervain's tenosynovitis, right 12/27/2014   Arthritis 12/06/2014   Essential hypertension 12/06/2014   GERD  (gastroesophageal reflux disease) 12/06/2014   Hyperlipemia 12/06/2014   Prediabetes 12/06/2014   Past Medical History:  Diagnosis Date   Arthritis    Elevated coronary artery calcium score 07/2022   96th %'tile   GERD (gastroesophageal reflux disease)    Hypercholesterolemia 08/2020   TLC->imp 12/2020   Hypertension    Obesity, Class I, BMI 30-34.9    Palpitation 04/2020   Zio patch x 3d -->occ PAC and PVC; triggered events correlate with times of PVCs.  Reassured.   Pulmonary nodules    on coronary ca score CT 08/28/22-->f/u CT 3-6 mo (Dr. Einar Gip)    Family History  Problem Relation Age of Onset   Arthritis Mother    Hearing loss Mother    Heart disease Mother    Arthritis Father    Heart disease Father    High Cholesterol Father    High blood pressure Father     Past Surgical History:  Procedure Laterality Date   BICEPT TENODESIS Left 12/06/2022   Procedure: BICEPS TENODESIS;  Surgeon: Meredith Pel, MD;  Location: [place];  Service: Orthopedics;  Laterality: Left;   CHOLECYSTECTOMY Right 09/14/2021   Procedure: LAPAROSCOPIC CHOLECYSTECTOMY WITH INTRAOPERATIVE CHOLANGIOGRAM,;  Surgeon: Greer Pickerel, MD;  Location: [facility] OR[facility];  Service: General;  Laterality: Right;   COLONOSCOPY  approx 2018   ? polyp,  unclear on recall time--get records.   coron calcium score     08/27/22: 96th%'tile (Dr. Jacinto Halim)   ESOPHAGEAL DILATION  2001   Dr. Madilyn Fireman.  Had it done twice (GERD-induced stricture)   Heart Rhythm monitoring  04/2020   Zio patch x 3d -->occ PAC and PVC; triggered events correlate with times of PVCs.  Reassured.   LEFT HEART CATH AND CORONARY ANGIOGRAPHY N/A 10/23/2022   Procedure: LEFT HEART CATH AND CORONARY ANGIOGRAPHY;  Surgeon: Yates Decamp, MD;  Location: [facility];  Service: Cardiovascular;  Laterality: N/A;   SHOULDER ARTHROSCOPY Left 12/06/2022   Procedure: left shoulder arthroscopy, debridement, bursectomy;  Surgeon: Cammy Copa, MD;  Location: [facility] OR;   Service: Orthopedics;  Laterality: Left;   TRANSTHORACIC ECHOCARDIOGRAM  09/28/2022   normal   Social History   Occupational History   Not on file  Tobacco Use   Smoking status: Former    Packs/day: 1.00    Years: 15.00    Total pack years: 15.00    Types: Cigarettes    Quit date: 2001    Years since quitting: 23.0   Smokeless tobacco: Never  Vaping Use   Vaping Use: Never used  Substance and Sexual Activity   Alcohol use: Never   Drug use: Never   Sexual activity: Not on file

## 2023-01-07 NOTE — Therapy (Signed)
OUTPATIENT PHYSICAL THERAPY TREATMENT   Patient Name: Todd Bailey MRN: [id number] DOB:1961-01-14, 62 y.o., male Today's Date: 01/07/2023  END OF SESSION:  PT End of Session - 01/07/23 1407     Visit Number 3    Number of Visits 21    Date for PT Re-Evaluation 02/21/23    Authorization Type [employer]    PT Start Time 1343    PT Stop Time 1423    PT Time Calculation (min) 40 min    Activity Tolerance Patient tolerated treatment well    Behavior During Therapy [facility] for tasks assessed/performed               Past Medical History:  Diagnosis Date   Arthritis    Elevated coronary artery calcium score 07/2022   96th %'tile   GERD (gastroesophageal reflux disease)    Hypercholesterolemia 08/2020   TLC->imp 12/2020   Hypertension    Obesity, Class I, BMI 30-34.9    Palpitation 04/2020   Zio patch x 3d -->occ PAC and PVC; triggered events correlate with times of PVCs.  Reassured.   Pulmonary nodules    on coronary ca score CT 08/28/22-->f/u CT 3-6 mo (Dr. Jacinto Halim)   Past Surgical History:  Procedure Laterality Date   BICEPT TENODESIS Left 12/06/2022   Procedure: BICEPS TENODESIS;  Surgeon: Cammy Copa, MD;  Location: [facility] OR;  Service: Orthopedics;  Laterality: Left;   CHOLECYSTECTOMY Right 09/14/2021   Procedure: LAPAROSCOPIC CHOLECYSTECTOMY WITH INTRAOPERATIVE CHOLANGIOGRAM,;  Surgeon: [name], MD;  Location: [facility] ORS;  Service: General;  Laterality: Right;   COLONOSCOPY  approx 2018   ? polyp, unclear on recall time--get records.   coron calcium score     08/27/22: 96th%'tile (Dr. Jacinto Halim)   ESOPHAGEAL DILATION  2001   Dr. Madilyn Fireman.  Had it done twice (GERD-induced stricture)   Heart Rhythm monitoring  04/2020   Zio patch x 3d -->occ PAC and PVC; triggered events correlate with times of PVCs.  Reassured.   LEFT HEART CATH AND CORONARY ANGIOGRAPHY N/A 10/23/2022   Procedure: LEFT HEART CATH AND CORONARY ANGIOGRAPHY;  Surgeon: Yates Decamp, MD;  Location: [facility] INV[facility];  Service: Cardiovascular;  Laterality: N/A;   SHOULDER ARTHROSCOPY Left 12/06/2022   Procedure: left shoulder arthroscopy, debridement, bursectomy;  Surgeon: Cammy Copa, MD;  Location: [facility] OR;  Service: Orthopedics;  Laterality: Left;   TRANSTHORACIC ECHOCARDIOGRAM  09/28/2022   normal   Patient Active Problem List   Diagnosis Date Noted   Biceps tendinitis of left shoulder 12/08/2022   Degenerative superior labral anterior-to-posterior (SLAP) tear of left shoulder 12/08/2022   Coronary artery disease of native artery of native heart with stable angina pectoris (HCC)    Cholelithiasis 09/12/2021   Cholecystitis 09/12/2021   H/O adenomatous polyp of colon 04/05/2016   De Quervain's tenosynovitis, right 12/27/2014   Arthritis 12/06/2014   Essential hypertension 12/06/2014   GERD (gastroesophageal reflux disease) 12/06/2014   Hyperlipemia 12/06/2014   Prediabetes 12/06/2014    PCP: Jeoffrey Massed, MD  REFERRING PROVIDER: Cammy Copa, MD  REFERRING DIAG: [phone] (ICD-10-CM) - Biceps tendinosis of left shoulder M25.512 (ICD-10-CM) - Left shoulder pain, unspecified chronicity M65.812 (ICD-10-CM) - Synovitis of left shoulder  THERAPY DIAG:  Left shoulder pain, unspecified chronicity  Stiffness of left shoulder, not elsewhere classified  Muscle weakness (generalized)  Abnormal posture  Rationale for Evaluation and Treatment: Rehabilitation  ONSET DATE: 12/06/22 Lt shoulder arthroscopy and biceps tenodesis  SUBJECTIVE:  SUBJECTIVE STATEMENT: Pt indicated having little to no pain at rest.    PERTINENT HISTORY: GERD, HTN, hx palpitations, pulmonary nodules  PAIN:   Location: Lt shoulder How would you describe your pain? sharp Best in past week: 0-1/10 Aggravating factors:  movements, Easing factors: ice, tylenol   PRECAUTIONS: s/p Lt shoulder arthroscopic debridement with biceps tenodesis "Passive range of motion active assisted range of motion of the left shoulder with no biceps resistance exercises." Per 12/14/22 MD note  WEIGHT BEARING RESTRICTIONS: No  FALLS:  Has patient fallen in last 6 months? No   LIVING ENVIRONMENT: 2 story home, main level livable. 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, desk work and up on feet  PLOF: Independent, get back to playing hockey  PATIENT GOALS: reduce pain, return to PLOF - get back to playing hockey  NEXT MD VISIT: 01/07/23  OBJECTIVE:   DIAGNOSTIC FINDINGS:  12/27/2022 12/06/22 shoulder arthroscopy with biceps tenodesis  PATIENT SURVEYS:  12/27/2022 FOTO 38%, 66 predicted  COGNITION: 12/27/2022 Overall cognitive status: Within functional limits for tasks assessed     SENSATION: 12/27/2022 Light touch grossly intact B UE, pt denies any sensory issues  POSTURE: 12/27/2022 Slightly guarded posture L UE, tendency to hold in [facility] IR, forward head  UPPER EXTREMITY ROM:  A/PROM Right 12/27/2022 Left 12/27/2022  Shoulder flexion 167/NT NT/92s  Shoulder abduction 170/NT NT/60s  Shoulder internal rotation    Shoulder external rotation  NT/18 deg s  Elbow flexion  Full elbow ROM painless but stiff sensation  Elbow extension    Wrist flexion    Wrist extension     (Blank rows = not tested) (Key: WFL = within functional limits not formally assessed, * = concordant pain, s = stiffness/stretching sensation, NT = not tested)  Comments: AROM NT given recency of surgery, MD note recommending AAROM/PROM at present  UPPER EXTREMITY MMT:  MMT Right eval Left eval  Shoulder flexion    Shoulder extension    Shoulder abduction    Shoulder extension    Shoulder internal rotation     Shoulder external rotation    Elbow flexion    Elbow extension    Grip strength     (Blank rows = not tested)  Comments: NT on eval given recency of surgery  SHOULDER SPECIAL TESTS: 12/27/2022 NT given recency of surgery  JOINT MOBILITY TESTING:  12/27/2022 NT  PALPATION:  12/27/2022 No overt TTP throughout shoulder girdle, mild muscular tightness UT/LS but asympomatic  OBSERVATION:  12/27/2022  Visually inspected surgical site w/ pt verbal consent. Appears WNL, healing well, no erythema or significant swelling. No drainage or excessive warmth   TODAY'S TREATMENT:                                                                                              DATE: 01/07/2023 Therapeutic Exercise:  UBE fwd/back 3 mins each way lvl 1.0 AAROM Supine passive Lt shoulder  x 10 with Rt arm lifting, movement to tolerance  Supine wand ER to  tolerance PROM c wand 5 sec hold x 5 c towel under arm  Supine wand AAROM shoulder flexion 1 lb bar 2 x 10  Supine wand AAROM scapular protraction 1 lb bar 3 sec hold  x 10  Pulleys AAROM flexion, scaption 2 mins (cues for home use)   Manual: Supine Lt shoulder PROM, g2-g3 inferior jt mobs in all planes, Posterior glide  TODAY'S TREATMENT:                                                                                              DATE: 01/02/2023 Therapeutic Exercise: Supine passive Lt shoulder 2 x 10 with Rt arm lifting, movement to tolerance (added to HEP) Supine wand ER to tolerance PROM c wand 5 sec hold x 10 c towel under arm (added to HEP) Seated pendulum Lt shoulder c Rt arm under elbow all directions x 10 (added to HEP) Review of existing HEP for techniques.    Manual: Supine Lt shoulder PROM, g2-g3 inferior jt mobs in all planes, Posterior glide  TODAY'S TREATMENT:                                                                                              DATE: 12/27/22 Therapeutic Exercise: Scapular retraction x10, cues for form/HEP,  emphasis on avoiding shoulder extension/ER AAROM elbow flex/ext x10, cues for appropriate performance/HEP Table shoulder flexion walkbacks x10, cues for appropriate performance/ROM, education for safety/HEP, emphasis on appropriate support and avoiding overt WB  Self Care: Time spent with pt education re: sleeping setup and appropriate pillow support, mitigating GH extension, activity modification/safety and monitoring symptoms with activity   PATIENT EDUCATION: Education details: Pt education on PT impairments, prognosis, and POC. Informed consent. Rationale for interventions, safe/appropriate HEP performance. Post surgical healing, activity modification and self care as above Person educated: Patient Education method: Explanation, Demonstration, Tactile cues, Verbal cues, and Handouts Education comprehension: verbalized understanding, returned demonstration, verbal cues required, tactile cues required, and needs further education    HOME EXERCISE PROGRAM: Access Code: [email or web address] URL: [email or web address] Date: 01/02/2023 Prepared by: Scot Jun  Program Notes scapular retraction (shoulder blade exercise) - no shoulder extension/ERcounter walk back - comfortable ROM  Exercises - Seated Scapular Retraction  - 2-3 x daily - 7 x weekly - 1 sets - 10 reps - Seated Elbow Flexion AAROM  - 2-3 x daily - 7 x weekly - 1 sets - 10 reps - Standing 'L' Stretch at Counter  - 1 x daily - 7 x weekly - 3 sets - 10 reps - Supine Shoulder Flexion PROM  - 2-3 x daily - 7 x weekly - 1-2 sets - 10 reps - 5 hold - Supine Shoulder External Rotation with Dowel  - 2-3 x daily -  7 x weekly - 1 sets - 10 reps - 5 hold - Standing Circular Shoulder Pendulum Supported with Arm Bent (Mirrored)  - 2-3 x daily - 7 x weekly - 1-2 sets - 10 reps  ASSESSMENT:  CLINICAL IMPRESSION: Reduced guarding in mobility noted today in passive movement in manual intervention.  AAROM transitioning still limited in  part due to symptoms during movements.  AAROM pulley exercise able to be done so added to HEP.   OBJECTIVE IMPAIRMENTS: decreased activity tolerance, decreased mobility, decreased ROM, decreased strength, hypomobility, increased edema, impaired flexibility, impaired UE functional use, postural dysfunction, and pain.   ACTIVITY LIMITATIONS: carrying, lifting, sitting, sleeping, bathing, dressing, reach over head, and hygiene/grooming  PARTICIPATION LIMITATIONS: meal prep, cleaning, laundry, driving, community activity, and occupation  PERSONAL FACTORS: 3+ comorbidities: HTN, pulmonary nodules, hx palpitations  are also affecting patient's functional outcome.   REHAB POTENTIAL: Good  CLINICAL DECISION MAKING: Stable/uncomplicated  EVALUATION COMPLEXITY: Low   GOALS: Goals reviewed with patient? No  SHORT TERM GOALS: Target date: 01/31/2023    Pt will demonstrate appropriate understanding and performance of initially prescribed HEP in order to facilitate improved independence with management of symptoms.  Baseline: HEP provided on eval Goal status: on going 01/02/2023   2. Pt will score greater than or equal to 51% on FOTO in order to demonstrate improved perception of function due to symptoms.  Baseline: 38%  Goal status: on going 01/02/2023   LONG TERM GOALS: Target date: 03/07/2023 Pt will score 66% on FOTO in order to demonstrate improved perception of function due to symptoms. Baseline: 38% Goal status: on going 01/02/2023  2.  Pt will demonstrate at least 150 degrees of L active shoulder elevation in order to demonstrate improved tolerance to functional movement patterns such as upper body dressing and reaching overhead.  Baseline: see ROM chart above - AROM NT on eval given recency of surgery Goal status: on going 01/02/2023  3.  Pt will demonstrate at least 4+/5 shoulder flexion/abduction MMT for improved symmetry of UE strength and improved tolerance to functional movements.   Baseline: see MMT chart above - deferred on eval given recency of surgery Goal status: on going 01/02/2023  4. Pt will report/demonstrate ability to lift up to 10 lbs with less than 2 point increase in pain on NPS in order to indicative improved tolerance/independence with heavier activities around the home.   Baseline: NT given recency of surgery  Goal status: on going 01/02/2023   PLAN:  PT FREQUENCY: 2x/week  PT DURATION: 10 weeks  PLANNED INTERVENTIONS: Therapeutic exercises, Therapeutic activity, Neuromuscular re-education, Balance training, Gait training, Patient/Family education, Self Care, Joint mobilization, Dry Needling, Electrical stimulation, Spinal mobilization, Cryotherapy, Moist heat, scar mobilization, Taping, Vasopneumatic device, Manual therapy, and Re-evaluation; above unless contraindicated  PLAN FOR NEXT SESSION: AAROM , manual to improve.   (Check MD visit updates)   Chyrel Masson, PT, DPT, OCS, ATC 01/07/23  2:21 PM

## 2023-01-10 ENCOUNTER — Encounter: Payer: Self-pay | Admitting: Rehabilitative and Restorative Service Providers"

## 2023-01-10 ENCOUNTER — Ambulatory Visit (INDEPENDENT_AMBULATORY_CARE_PROVIDER_SITE_OTHER): Payer: BC Managed Care – PPO | Admitting: Rehabilitative and Restorative Service Providers"

## 2023-01-10 DIAGNOSIS — R293 Abnormal posture: Secondary | ICD-10-CM

## 2023-01-10 DIAGNOSIS — M6281 Muscle weakness (generalized): Secondary | ICD-10-CM | POA: Diagnosis not present

## 2023-01-10 DIAGNOSIS — M25612 Stiffness of left shoulder, not elsewhere classified: Secondary | ICD-10-CM | POA: Diagnosis not present

## 2023-01-10 DIAGNOSIS — M25512 Pain in left shoulder: Secondary | ICD-10-CM

## 2023-01-10 NOTE — Therapy (Addendum)
OUTPATIENT PHYSICAL THERAPY TREATMENT   Patient Name: Todd Bailey MRN: [id number] DOB:[birth year], 62 y.o., male Today's Date: 01/10/2023  END OF SESSION:  PT End of Session - 01/10/23 1134     Visit Number 4    Number of Visits 21    Date for PT Re-Evaluation 02/21/23    Authorization Type [employer]    PT Start Time 1139    PT Stop Time 1218    PT Time Calculation (min) 39 min    Activity Tolerance Patient tolerated treatment well    Behavior During Therapy [facility] for tasks assessed/performed                Past Medical History:  Diagnosis Date   Arthritis    Elevated coronary artery calcium score 07/2022   96th %'tile   GERD (gastroesophageal reflux disease)    Hypercholesterolemia 08/2020   TLC->imp 12/2020   Hypertension    Obesity, Class I, BMI 30-34.9    Palpitation 04/2020   Zio patch x 3d -->occ PAC and PVC; triggered events correlate with times of PVCs.  Reassured.   Pulmonary nodules    on coronary ca score CT 08/28/22-->f/u CT 3-6 mo (Dr. Einar Gip)   Past Surgical History:  Procedure Laterality Date   BICEPT TENODESIS Left 12/06/2022   Procedure: BICEPS TENODESIS;  Surgeon: Meredith Pel, MD;  Location: [place];  Service: Orthopedics;  Laterality: Left;   CHOLECYSTECTOMY Right 09/14/2021   Procedure: LAPAROSCOPIC CHOLECYSTECTOMY WITH INTRAOPERATIVE CHOLANGIOGRAM,;  Surgeon: Greer Pickerel, MD;  Location: [facility];  Service: General;  Laterality: Right;   COLONOSCOPY  approx 2018   ? polyp, unclear on recall time--get records.   coron calcium score     08/27/22: 96th%'tile (Dr. Einar Gip)   ESOPHAGEAL DILATION  2001   Dr. Amedeo Plenty.  Had it done twice (GERD-induced stricture)   Heart Rhythm monitoring  04/2020   Zio patch x 3d -->occ PAC and PVC; triggered events correlate with times of PVCs.  Reassured.   LEFT HEART CATH AND CORONARY ANGIOGRAPHY N/A 10/23/2022   Procedure: LEFT HEART CATH AND CORONARY ANGIOGRAPHY;  Surgeon: Adrian Prows, MD;  Location: [place]  [facility];  Service: Cardiovascular;  Laterality: N/A;   SHOULDER ARTHROSCOPY Left 12/06/2022   Procedure: left shoulder arthroscopy, debridement, bursectomy;  Surgeon: Meredith Pel, MD;  Location: [place];  Service: Orthopedics;  Laterality: Left;   TRANSTHORACIC ECHOCARDIOGRAM  09/28/2022   normal   Patient Active Problem List   Diagnosis Date Noted   Biceps tendinitis of left shoulder 12/08/2022   Degenerative superior labral anterior-to-posterior (SLAP) tear of left shoulder 12/08/2022   Coronary artery disease of native artery of native heart with stable angina pectoris ([place])    Cholelithiasis 09/12/2021   Cholecystitis 09/12/2021   H/O adenomatous polyp of colon 04/05/2016   De Quervain's tenosynovitis, right 12/27/2014   Arthritis 12/06/2014   Essential hypertension 12/06/2014   GERD (gastroesophageal reflux disease) 12/06/2014   Hyperlipemia 12/06/2014   Prediabetes 12/06/2014    PCP: Tammi Sou, MD  REFERRING PROVIDER: Meredith Pel, MD  REFERRING DIAG: [phone] (ICD-10-CM) - Biceps tendinosis of left shoulder M25.512 (ICD-10-CM) - Left shoulder pain, unspecified chronicity M65.812 (ICD-10-CM) - Synovitis of left shoulder  THERAPY DIAG:  Left shoulder pain, unspecified chronicity  Stiffness of left shoulder, not elsewhere classified  Muscle weakness (generalized)  Abnormal posture  Rationale for Evaluation and Treatment: Rehabilitation  ONSET DATE: 12/06/22 Lt shoulder arthroscopy and biceps tenodesis  SUBJECTIVE:  SUBJECTIVE STATEMENT: Pt indicated he has progressed in motion and did fairly well   PERTINENT HISTORY: GERD, HTN, hx palpitations, pulmonary nodules  PAIN:   Location: Lt shoulder How would you describe your pain? sharp Best in past week:  0-1/10 Aggravating factors: movements, Easing factors: ice, tylenol   PRECAUTIONS: s/p Lt shoulder arthroscopic debridement with biceps tenodesis "Passive range of motion active assisted range of motion of the left shoulder with no biceps resistance exercises." Per 12/14/22 MD note  01/07/2023 MD note update:   No lifting with that left arm for the next 2 weeks. Weeks 2-4 from now he is okay to do lifting of 5 pounds. Weeks 4-6 from now he is okay to lift 10 pounds with the left arm.   WEIGHT BEARING RESTRICTIONS: No  FALLS:  Has patient fallen in last 6 months? No   LIVING ENVIRONMENT: 2 story home, main level livable. 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, desk work and up on feet  PLOF: Independent, get back to playing hockey  PATIENT GOALS: reduce pain, return to PLOF - get back to playing hockey  NEXT MD VISIT: 01/07/23  OBJECTIVE:   DIAGNOSTIC FINDINGS:  12/27/2022 12/06/22 shoulder arthroscopy with biceps tenodesis  PATIENT SURVEYS:  12/27/2022 FOTO 38%, 66 predicted  COGNITION: 12/27/2022 Overall cognitive status: Within functional limits for tasks assessed     SENSATION: 12/27/2022 Light touch grossly intact bilateral UE, pt denies any sensory issues  POSTURE: 12/27/2022 Slightly guarded posture Lt UE, tendency to hold in [facility] IR, forward head  UPPER EXTREMITY ROM:  A/PROM Right 12/27/2022 Left 12/27/2022 Left 01/10/2023  Shoulder flexion 167/NT NT/92s 120 AROM in supine  Shoulder abduction 170/NT NT/60s   Shoulder internal rotation     Shoulder external rotation  NT/18 deg s   Elbow flexion  Full elbow ROM painless but stiff sensation   Elbow extension     Wrist flexion     Wrist extension      (Blank rows = not tested) (Key: WFL = within functional limits not formally assessed, * = concordant pain, s = stiffness/stretching sensation,  NT = not tested)  EVAL: Comments: AROM NT given recency of surgery, MD note recommending AAROM/PROM at present  UPPER EXTREMITY MMT:  MMT Right eval Left eval  Shoulder flexion    Shoulder extension    Shoulder abduction    Shoulder extension    Shoulder internal rotation    Shoulder external rotation    Elbow flexion    Elbow extension    Grip strength     (Blank rows = not tested)  EVAL: Comments: NT on eval given recency of surgery  SHOULDER SPECIAL TESTS: 12/27/2022 NT given recency of surgery  JOINT MOBILITY TESTING:  12/27/2022 NT  PALPATION:  12/27/2022 No overt TTP throughout shoulder girdle, mild muscular tightness UT/LS but asympomatic  OBSERVATION:  12/27/2022  Visually inspected surgical site w/ pt verbal consent. Appears WNL, healing well, no erythema or significant swelling. No drainage or excessive warmth   TODAY'S TREATMENT:  DATE: 01/10/2023 Therapeutic Exercise:  UBE fwd/back 3 mins each way lvl 1.0 AAROM Supine wand AAROM shoulder flexion 1 lb bar x15 Supine AROM bilateral shoulder flexion 2 x 15 Supine wand AROM scapular protraction 3 sec hold x 10 bilateral Supine wand ER to tolerance PROM c wand 5 sec hold x 5 c towel under arm   Tband rows Green band 2 x 15 bilateral Tband GH extension Green band 2 x 15 bilateral  Pulleys AAROM flexion, scaption 2 mins  Standing isometric hold ER against doorframe 5 sec on/off 2 mins   TODAY'S TREATMENT:                                                                                              DATE: 01/07/2023 Therapeutic Exercise:  UBE fwd/back 3 mins each way lvl 1.0 AAROM Supine passive Lt shoulder  x 10 with Rt arm lifting, movement to tolerance  Supine wand ER to tolerance PROM c wand 5 sec hold x 5 c towel under arm  Supine wand AAROM shoulder flexion 1 lb bar 2 x 10  Supine wand AAROM scapular protraction 1  lb bar 3 sec hold  x 10  Pulleys AAROM flexion, scaption 2 mins (cues for home use)   Manual: Supine Lt shoulder PROM, g2-g3 inferior jt mobs in all planes, Posterior glide  TODAY'S TREATMENT:                                                                                              DATE: 01/02/2023 Therapeutic Exercise: Supine passive Lt shoulder 2 x 10 with Rt arm lifting, movement to tolerance (added to HEP) Supine wand ER to tolerance PROM c wand 5 sec hold x 10 c towel under arm (added to HEP) Seated pendulum Lt shoulder c Rt arm under elbow all directions x 10 (added to HEP) Review of existing HEP for techniques.    Manual: Supine Lt shoulder PROM, g2-g3 inferior jt mobs in all planes, Posterior glide   PATIENT EDUCATION: 01/10/2023 Education details: HEP Person educated: Patient Education method: Programmer, multimedia, Facilities manager, Verbal cues, and Handouts Education comprehension: verbalized understanding, returned demonstration  HOME EXERCISE PROGRAM: Access Code: [facility] URL: [email or web address] Date: 01/10/2023 Prepared by: Chyrel Masson  Exercises - Seated Scapular Retraction  - 2-3 x daily - 7 x weekly - 1 sets - 10 reps - Supine Shoulder External Rotation with Dowel  - 2-3 x daily - 7 x weekly - 1 sets - 10 reps - 5 hold - Supine Shoulder Flexion Extension AAROM with Dowel  - 1-2 x daily - 7 x weekly - 1-2 sets - 10-15 reps - 3 hold - Supine Shoulder Flexion Extension Full Range AROM (Mirrored)  - 1-2 x daily - 7 x  weekly - 2-3 sets - 10-15 reps - Supine Scapular Protraction in Flexion with Dumbbells  - 1-2 x daily - 7 x weekly - 1 sets - 10 reps - 3 hold  ASSESSMENT:  CLINICAL IMPRESSION: Based off review from MD visit, able to progress to include active range movements without lifting weight.  Still plan to avoid active biceps strengthening at this time.  Flexion active in supine Lt shoulder 120 degrees today.  Continued skilled PT services indicated  at this time.   OBJECTIVE IMPAIRMENTS: decreased activity tolerance, decreased mobility, decreased ROM, decreased strength, hypomobility, increased edema, impaired flexibility, impaired UE functional use, postural dysfunction, and pain.   ACTIVITY LIMITATIONS: carrying, lifting, sitting, sleeping, bathing, dressing, reach over head, and hygiene/grooming  PARTICIPATION LIMITATIONS: meal prep, cleaning, laundry, driving, community activity, and occupation  PERSONAL FACTORS: 3+ comorbidities: HTN, pulmonary nodules, hx palpitations  are also affecting patient's functional outcome.   REHAB POTENTIAL: Good  CLINICAL DECISION MAKING: Stable/uncomplicated  EVALUATION COMPLEXITY: Low   GOALS: Goals reviewed with patient? No  SHORT TERM GOALS: Target date: 01/31/2023    Pt will demonstrate appropriate understanding and performance of initially prescribed HEP in order to facilitate improved independence with management of symptoms.  Baseline: HEP provided on eval Goal status: on going 01/02/2023   2. Pt will score greater than or equal to 51% on FOTO in order to demonstrate improved perception of function due to symptoms.  Baseline: 38%  Goal status: on going 01/02/2023   LONG TERM GOALS: Target date: 03/07/2023 Pt will score 66% on FOTO in order to demonstrate improved perception of function due to symptoms. Baseline: 38% Goal status: on going 01/02/2023  2.  Pt will demonstrate at least 150 degrees of L active shoulder elevation in order to demonstrate improved tolerance to functional movement patterns such as upper body dressing and reaching overhead.  Baseline: see ROM chart above - AROM NT on eval given recency of surgery Goal status: on going 01/02/2023  3.  Pt will demonstrate at least 4+/5 shoulder flexion/abduction MMT for improved symmetry of UE strength and improved tolerance to functional movements.  Baseline: see MMT chart above - deferred on eval given recency of surgery Goal  status: on going 01/02/2023  4. Pt will report/demonstrate ability to lift up to 10 lbs with less than 2 point increase in pain on NPS in order to indicative improved tolerance/independence with heavier activities around the home.   Baseline: NT given recency of surgery  Goal status: on going 01/02/2023   PLAN:  PT FREQUENCY: 2x/week  PT DURATION: 10 weeks  PLANNED INTERVENTIONS: Therapeutic exercises, Therapeutic activity, Neuromuscular re-education, Balance training, Gait training, Patient/Family education, Self Care, Joint mobilization, Dry Needling, Electrical stimulation, Spinal mobilization, Cryotherapy, Moist heat, scar mobilization, Taping, Vasopneumatic device, Manual therapy, and Re-evaluation; above unless contraindicated  PLAN FOR NEXT SESSION:   No lifting weight at this time per MD.  Early active movement improvements   Chyrel Masson, PT, DPT, OCS, ATC 01/10/23  12:18 PM

## 2023-01-14 ENCOUNTER — Ambulatory Visit (INDEPENDENT_AMBULATORY_CARE_PROVIDER_SITE_OTHER): Payer: BC Managed Care – PPO | Admitting: Rehabilitative and Restorative Service Providers"

## 2023-01-14 ENCOUNTER — Encounter: Payer: Self-pay | Admitting: Rehabilitative and Restorative Service Providers"

## 2023-01-14 DIAGNOSIS — M25512 Pain in left shoulder: Secondary | ICD-10-CM

## 2023-01-14 DIAGNOSIS — R293 Abnormal posture: Secondary | ICD-10-CM | POA: Diagnosis not present

## 2023-01-14 DIAGNOSIS — M6281 Muscle weakness (generalized): Secondary | ICD-10-CM

## 2023-01-14 DIAGNOSIS — M25612 Stiffness of left shoulder, not elsewhere classified: Secondary | ICD-10-CM

## 2023-01-14 NOTE — Therapy (Signed)
OUTPATIENT PHYSICAL THERAPY TREATMENT   Patient Name: Todd Bailey MRN: [id number] DOB:06-14-1961, 62 y.o., male Today's Date: 01/14/2023  END OF SESSION:  PT End of Session - 01/14/23 1611     Visit Number 5    Number of Visits 21    Date for PT Re-Evaluation 02/21/23    Authorization Type BC[employer]    PT Start Time 1554    PT Stop Time 1633    PT Time Calculation (min) 39 min    Activity Tolerance Patient tolerated treatment well    Behavior During Therapy [facility] for tasks assessed/performed                 Past Medical History:  Diagnosis Date   Arthritis    Elevated coronary artery calcium score 07/2022   96th %'tile   GERD (gastroesophageal reflux disease)    Hypercholesterolemia 08/2020   TLC->imp 12/2020   Hypertension    Obesity, Class I, BMI 30-34.9    Palpitation 04/2020   Zio patch x 3d -->occ PAC and PVC; triggered events correlate with times of PVCs.  Reassured.   Pulmonary nodules    on coronary ca score CT 08/28/22-->f/u CT 3-6 mo (Dr. Jacinto Halim)   Past Surgical History:  Procedure Laterality Date   BICEPT TENODESIS Left 12/06/2022   Procedure: BICEPS TENODESIS;  Surgeon: Cammy Copa, MD;  Location: [facility] OR;  Service: Orthopedics;  Laterality: Left;   CHOLECYSTECTOMY Right 09/14/2021   Procedure: LAPAROSCOPIC CHOLECYSTECTOMY WITH INTRAOPERATIVE CHOLANGIOGRAM,;  Surgeon: [name], MD;  Location: [facility] ORS;  Service: General;  Laterality: Right;   COLONOSCOPY  approx 2018   ? polyp, unclear on recall time--get records.   coron calcium score     08/27/22: 96th%'tile (Dr. Jacinto Halim)   ESOPHAGEAL DILATION  2001   Dr. Madilyn Fireman.  Had it done twice (GERD-induced stricture)   Heart Rhythm monitoring  04/2020   Zio patch x 3d -->occ PAC and PVC; triggered events correlate with times of PVCs.  Reassured.   LEFT HEART CATH AND CORONARY ANGIOGRAPHY N/A 10/23/2022   Procedure: LEFT HEART CATH AND CORONARY ANGIOGRAPHY;  Surgeon: Yates Decamp, MD;  Location: [facility]  INVASIVE CV LAB;  Service: Cardiovascular;  Laterality: N/A;   SHOULDER ARTHROSCOPY Left 12/06/2022   Procedure: left shoulder arthroscopy, debridement, bursectomy;  Surgeon: Cammy Copa, MD;  Location: [facility] OR;  Service: Orthopedics;  Laterality: Left;   TRANSTHORACIC ECHOCARDIOGRAM  09/28/2022   normal   Patient Active Problem List   Diagnosis Date Noted   Biceps tendinitis of left shoulder 12/08/2022   Degenerative superior labral anterior-to-posterior (SLAP) tear of left shoulder 12/08/2022   Coronary artery disease of native artery of native heart with stable angina pectoris (HCC)    Cholelithiasis 09/12/2021   Cholecystitis 09/12/2021   H/O adenomatous polyp of colon 04/05/2016   De Quervain's tenosynovitis, right 12/27/2014   Arthritis 12/06/2014   Essential hypertension 12/06/2014   GERD (gastroesophageal reflux disease) 12/06/2014   Hyperlipemia 12/06/2014   Prediabetes 12/06/2014    PCP: Jeoffrey Massed, MD  REFERRING PROVIDER: Cammy Copa, MD  REFERRING DIAG: [phone] (ICD-10-CM) - Biceps tendinosis of left shoulder M25.512 (ICD-10-CM) - Left shoulder pain, unspecified chronicity M65.812 (ICD-10-CM) - Synovitis of left shoulder  THERAPY DIAG:  Left shoulder pain, unspecified chronicity  Stiffness of left shoulder, not elsewhere classified  Muscle weakness (generalized)  Abnormal posture  Rationale for Evaluation and Treatment: Rehabilitation  ONSET DATE: 12/06/22 Lt shoulder arthroscopy and biceps tenodesis  SUBJECTIVE:  SUBJECTIVE STATEMENT: Pt indicated feeling a little tender after pushing the stretching today.   PERTINENT HISTORY: GERD, HTN, hx palpitations, pulmonary nodules  PAIN:   Location: Lt shoulder How would you describe your pain? sore Best in past  week: 0-1/10 Aggravating factors: movements, Easing factors: ice, tylenol   PRECAUTIONS: s/p Lt shoulder arthroscopic debridement with biceps tenodesis "Passive range of motion active assisted range of motion of the left shoulder with no biceps resistance exercises." Per 12/14/22 MD note  01/07/2023 MD note update:   No lifting with that left arm for the next 2 weeks. Weeks 2-4 from now he is okay to do lifting of 5 pounds. Weeks 4-6 from now he is okay to lift 10 pounds with the left arm.   WEIGHT BEARING RESTRICTIONS: No  FALLS:  Has patient fallen in last 6 months? No   LIVING ENVIRONMENT: 2 story home, main level livable. 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, desk work and up on feet  PLOF: Independent, get back to playing hockey  PATIENT GOALS: reduce pain, return to PLOF - get back to playing hockey  NEXT MD VISIT: 01/07/23  OBJECTIVE:   DIAGNOSTIC FINDINGS:  12/27/2022 12/06/22 shoulder arthroscopy with biceps tenodesis  PATIENT SURVEYS:  12/27/2022 FOTO 38%, 66 predicted  COGNITION: 12/27/2022 Overall cognitive status: Within functional limits for tasks assessed     SENSATION: 12/27/2022 Light touch grossly intact bilateral UE, pt denies any sensory issues  POSTURE: 12/27/2022 Slightly guarded posture Lt UE, tendency to hold in [facility] IR, forward head  UPPER EXTREMITY ROM:  A/PROM Right 12/27/2022 Left 12/27/2022 Left 01/10/2023  Shoulder flexion 167/NT NT/92s 120 AROM in supine  Shoulder abduction 170/NT NT/60s   Shoulder internal rotation     Shoulder external rotation  NT/18 deg s   Elbow flexion  Full elbow ROM painless but stiff sensation   Elbow extension     Wrist flexion     Wrist extension      (Blank rows = not tested) (Key: WFL = within functional limits not formally assessed, * = concordant pain, s = stiffness/stretching  sensation, NT = not tested)  EVAL: Comments: AROM NT given recency of surgery, MD note recommending AAROM/PROM at present  UPPER EXTREMITY MMT:  MMT Right eval Left eval  Shoulder flexion    Shoulder extension    Shoulder abduction    Shoulder extension    Shoulder internal rotation    Shoulder external rotation    Elbow flexion    Elbow extension    Grip strength     (Blank rows = not tested)  EVAL: Comments: NT on eval given recency of surgery  SHOULDER SPECIAL TESTS: 12/27/2022 NT given recency of surgery  JOINT MOBILITY TESTING:  12/27/2022 NT  PALPATION:  12/27/2022 No overt TTP throughout shoulder girdle, mild muscular tightness UT/LS but asympomatic  OBSERVATION:  12/27/2022  Visually inspected surgical site w/ pt verbal consent. Appears WNL, healing well, no erythema or significant swelling. No drainage or excessive warmth   TODAY'S TREATMENT:  DATE: 01/14/2023 Therapeutic Exercise:  UBE fwd/back 4 mins each way lvl 2.0 AAROM with 10 second interval faster at end of each minute  Sidelying Lt shoulder AROM abduction 2 x 12 Sidelying Lt shoulder AROM ER c towel at side 2 x 15  Supine AROM bilateral shoulder flexion 2 x 15 in 25 deg bed elevation  Tband rows Green band x20 bilateral Tband GH extension Green band x20 bilateral    TODAY'S TREATMENT:                                                                                              DATE: 01/10/2023 Therapeutic Exercise:  UBE fwd/back 3 mins each way lvl 1.0 AAROM Supine wand AAROM shoulder flexion 1 lb bar x15 Supine AROM bilateral shoulder flexion 2 x 15 Supine wand AROM scapular protraction 3 sec hold x 10 bilateral Supine wand ER to tolerance PROM c wand 5 sec hold x 5 c towel under arm   Tband rows Green band 2 x 15 bilateral Tband GH extension Green band 2 x 15 bilateral  Pulleys AAROM flexion,  scaption 2 mins  Standing isometric hold ER against doorframe 5 sec on/off 2 mins   TODAY'S TREATMENT:                                                                                              DATE: 01/07/2023 Therapeutic Exercise:  UBE fwd/back 3 mins each way lvl 1.0 AAROM Supine passive Lt shoulder  x 10 with Rt arm lifting, movement to tolerance  Supine wand ER to tolerance PROM c wand 5 sec hold x 5 c towel under arm  Supine wand AAROM shoulder flexion 1 lb bar 2 x 10  Supine wand AAROM scapular protraction 1 lb bar 3 sec hold  x 10  Pulleys AAROM flexion, scaption 2 mins (cues for home use)   Manual: Supine Lt shoulder PROM, g2-g3 inferior jt mobs in all planes, Posterior glide   PATIENT EDUCATION: 01/14/2023 Education details: HEP update Person educated: Patient Education method: Programmer, multimedia, Demonstration, Verbal cues, and Handouts Education comprehension: verbalized understanding, returned demonstration  HOME EXERCISE PROGRAM: Access Code: [facility] URL: [email or web address] Date: 01/14/2023 Prepared by: Chyrel Masson  Exercises - Seated Scapular Retraction  - 2-3 x daily - 7 x weekly - 1 sets - 10 reps - Supine Shoulder External Rotation with Dowel  - 2-3 x daily - 7 x weekly - 1 sets - 10 reps - 5 hold - Supine Shoulder Flexion Extension AAROM with Dowel  - 1-2 x daily - 7 x weekly - 1-2 sets - 10-15 reps - 3 hold - Supine Shoulder Flexion Extension Full Range AROM (Mirrored)  - 1-2 x daily - 7 x weekly - 2-3 sets -  10-15 reps - Supine Scapular Protraction in Flexion with Dumbbells  - 1-2 x daily - 7 x weekly - 1 sets - 10 reps - 3 hold - Sidelying Shoulder Abduction Palm Forward (Mirrored)  - 1-2 x daily - 7 x weekly - 2-3 sets - 10-15 reps - Sidelying Shoulder External Rotation  - 1-2 x daily - 7 x weekly - 2-3 sets - 10-15 reps  ASSESSMENT:  CLINICAL IMPRESSION: Continued progression today of increased amount of active movement training with body  weight resistances only.  Continued skilled PT c focus on improving control in movements key for functional daily activity.   OBJECTIVE IMPAIRMENTS: decreased activity tolerance, decreased mobility, decreased ROM, decreased strength, hypomobility, increased edema, impaired flexibility, impaired UE functional use, postural dysfunction, and pain.   ACTIVITY LIMITATIONS: carrying, lifting, sitting, sleeping, bathing, dressing, reach over head, and hygiene/grooming  PARTICIPATION LIMITATIONS: meal prep, cleaning, laundry, driving, community activity, and occupation  PERSONAL FACTORS: 3+ comorbidities: HTN, pulmonary nodules, hx palpitations  are also affecting patient's functional outcome.   REHAB POTENTIAL: Good  CLINICAL DECISION MAKING: Stable/uncomplicated  EVALUATION COMPLEXITY: Low   GOALS: Goals reviewed with patient? No  SHORT TERM GOALS: Target date: 01/31/2023    Pt will demonstrate appropriate understanding and performance of initially prescribed HEP in order to facilitate improved independence with management of symptoms.  Baseline: HEP provided on eval Goal status: on going 01/02/2023   2. Pt will score greater than or equal to 51% on FOTO in order to demonstrate improved perception of function due to symptoms.  Baseline: 38%  Goal status: on going 01/02/2023   LONG TERM GOALS: Target date: 03/07/2023 Pt will score 66% on FOTO in order to demonstrate improved perception of function due to symptoms. Baseline: 38% Goal status: on going 01/02/2023  2.  Pt will demonstrate at least 150 degrees of L active shoulder elevation in order to demonstrate improved tolerance to functional movement patterns such as upper body dressing and reaching overhead.  Baseline: see ROM chart above - AROM NT on eval given recency of surgery Goal status: on going 01/02/2023  3.  Pt will demonstrate at least 4+/5 shoulder flexion/abduction MMT for improved symmetry of UE strength and improved tolerance to  functional movements.  Baseline: see MMT chart above - deferred on eval given recency of surgery Goal status: on going 01/02/2023  4. Pt will report/demonstrate ability to lift up to 10 lbs with less than 2 point increase in pain on NPS in order to indicative improved tolerance/independence with heavier activities around the home.   Baseline: NT given recency of surgery  Goal status: on going 01/02/2023   PLAN:  PT FREQUENCY: 2x/week  PT DURATION: 10 weeks  PLANNED INTERVENTIONS: Therapeutic exercises, Therapeutic activity, Neuromuscular re-education, Balance training, Gait training, Patient/Family education, Self Care, Joint mobilization, Dry Needling, Electrical stimulation, Spinal mobilization, Cryotherapy, Moist heat, scar mobilization, Taping, Vasopneumatic device, Manual therapy, and Re-evaluation; above unless contraindicated  PLAN FOR NEXT SESSION:   No lifting weight at this time per MD.  Active range against gravity as able without shrug   Scot Jun, PT, DPT, OCS, ATC 01/14/23  4:30 PM

## 2023-01-17 ENCOUNTER — Ambulatory Visit (INDEPENDENT_AMBULATORY_CARE_PROVIDER_SITE_OTHER): Payer: BC Managed Care – PPO | Admitting: Rehabilitative and Restorative Service Providers"

## 2023-01-17 ENCOUNTER — Encounter: Payer: Self-pay | Admitting: Rehabilitative and Restorative Service Providers"

## 2023-01-17 DIAGNOSIS — M25612 Stiffness of left shoulder, not elsewhere classified: Secondary | ICD-10-CM | POA: Diagnosis not present

## 2023-01-17 DIAGNOSIS — M25512 Pain in left shoulder: Secondary | ICD-10-CM | POA: Diagnosis not present

## 2023-01-17 DIAGNOSIS — M6281 Muscle weakness (generalized): Secondary | ICD-10-CM | POA: Diagnosis not present

## 2023-01-17 DIAGNOSIS — R293 Abnormal posture: Secondary | ICD-10-CM

## 2023-01-17 NOTE — Therapy (Signed)
OUTPATIENT PHYSICAL THERAPY TREATMENT   Patient Name: ARBIE BLANKLEY MRN: [id number] DOB:1961-08-08, 62 y.o., male Today's Date: 01/17/2023  END OF SESSION:  PT End of Session - 01/17/23 1518     Visit Number 6    Number of Visits 21    Date for PT Re-Evaluation 02/21/23    Authorization Type BC[employer]    PT Start Time 1512    PT Stop Time 1551    PT Time Calculation (min) 39 min    Activity Tolerance Patient tolerated treatment well    Behavior During Therapy [facility] for tasks assessed/performed                  Past Medical History:  Diagnosis Date   Arthritis    Elevated coronary artery calcium score 07/2022   96th %'tile   GERD (gastroesophageal reflux disease)    Hypercholesterolemia 08/2020   TLC->imp 12/2020   Hypertension    Obesity, Class I, BMI 30-34.9    Palpitation 04/2020   Zio patch x 3d -->occ PAC and PVC; triggered events correlate with times of PVCs.  Reassured.   Pulmonary nodules    on coronary ca score CT 08/28/22-->f/u CT 3-6 mo (Dr. Einar Gip)   Past Surgical History:  Procedure Laterality Date   BICEPT TENODESIS Left 12/06/2022   Procedure: BICEPS TENODESIS;  Surgeon: Meredith Pel, MD;  Location: [facility];  Service: Orthopedics;  Laterality: Left;   CHOLECYSTECTOMY Right 09/14/2021   Procedure: LAPAROSCOPIC CHOLECYSTECTOMY WITH INTRAOPERATIVE CHOLANGIOGRAM,;  Surgeon: Greer Pickerel, MD;  Location: [facility];  Service: General;  Laterality: Right;   COLONOSCOPY  approx 2018   ? polyp, unclear on recall time--get records.   coron calcium score     08/27/22: 96th%'tile (Dr. Einar Gip)   ESOPHAGEAL DILATION  2001   Dr. Amedeo Plenty.  Had it done twice (GERD-induced stricture)   Heart Rhythm monitoring  04/2020   Zio patch x 3d -->occ PAC and PVC; triggered events correlate with times of PVCs.  Reassured.   LEFT HEART CATH AND CORONARY ANGIOGRAPHY N/A 10/23/2022   Procedure: LEFT HEART CATH AND CORONARY ANGIOGRAPHY;  Surgeon: Adrian Prows, MD;  Location: [place] [facility];  Service: Cardiovascular;  Laterality: N/A;   SHOULDER ARTHROSCOPY Left 12/06/2022   Procedure: left shoulder arthroscopy, debridement, bursectomy;  Surgeon: Meredith Pel, MD;  Location: [place];  Service: Orthopedics;  Laterality: Left;   TRANSTHORACIC ECHOCARDIOGRAM  09/28/2022   normal   Patient Active Problem List   Diagnosis Date Noted   Biceps tendinitis of left shoulder 12/08/2022   Degenerative superior labral anterior-to-posterior (SLAP) tear of left shoulder 12/08/2022   Coronary artery disease of native artery of native heart with stable angina pectoris ([place])    Cholelithiasis 09/12/2021   Cholecystitis 09/12/2021   H/O adenomatous polyp of colon 04/05/2016   De Quervain's tenosynovitis, right 12/27/2014   Arthritis 12/06/2014   Essential hypertension 12/06/2014   GERD (gastroesophageal reflux disease) 12/06/2014   Hyperlipemia 12/06/2014   Prediabetes 12/06/2014    PCP: Tammi Sou, MD  REFERRING PROVIDER: Meredith Pel, MD  REFERRING DIAG: [phone] (ICD-10-CM) - Biceps tendinosis of left shoulder M25.512 (ICD-10-CM) - Left shoulder pain, unspecified chronicity M65.812 (ICD-10-CM) - Synovitis of left shoulder  THERAPY DIAG:  Left shoulder pain, unspecified chronicity  Stiffness of left shoulder, not elsewhere classified  Muscle weakness (generalized)  Abnormal posture  Rationale for Evaluation and Treatment: Rehabilitation  ONSET DATE: 12/06/22 Lt shoulder arthroscopy and biceps tenodesis  SUBJECTIVE:  SUBJECTIVE STATEMENT: No complaints of specific pain today.  Reported good routine of HEP since last visit.  Some soreness at times.   PERTINENT HISTORY: GERD, HTN, hx palpitations, pulmonary nodules  PAIN:   Location: Lt shoulder How would you  describe your pain? sore Best in past week: 0-1/10 Aggravating factors: movements, Easing factors: ice, tylenol   PRECAUTIONS: s/p Lt shoulder arthroscopic debridement with biceps tenodesis "Passive range of motion active assisted range of motion of the left shoulder with no biceps resistance exercises." Per 12/14/22 MD note  01/07/2023 MD note update:   No lifting with that left arm for the next 2 weeks. Weeks 2-4 from now he is okay to do lifting of 5 pounds. Weeks 4-6 from now he is okay to lift 10 pounds with the left arm.   WEIGHT BEARING RESTRICTIONS: No  FALLS:  Has patient fallen in last 6 months? No   LIVING ENVIRONMENT: 2 story home, main level livable. 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, desk work and up on feet  PLOF: Independent, get back to playing hockey  PATIENT GOALS: reduce pain, return to PLOF - get back to playing hockey  NEXT MD VISIT: 01/07/23  OBJECTIVE:   DIAGNOSTIC FINDINGS:  12/27/2022 12/06/22 shoulder arthroscopy with biceps tenodesis  PATIENT SURVEYS:  12/27/2022 FOTO 38%, 66 predicted  COGNITION: 12/27/2022 Overall cognitive status: Within functional limits for tasks assessed     SENSATION: 12/27/2022 Light touch grossly intact bilateral UE, pt denies any sensory issues  POSTURE: 12/27/2022 Slightly guarded posture Lt UE, tendency to hold in [facility] IR, forward head  UPPER EXTREMITY ROM:  A/PROM Right 12/27/2022 Left 12/27/2022 Left 01/10/2023 Left 01/17/2023  Shoulder flexion 167/NT NT/92s 120 AROM in supine 135 AROM in supine, AAROM with stick 139  Shoulder abduction 170/NT NT/60s  90 AROM in supine  Shoulder internal rotation      Shoulder external rotation  NT/18 deg s    Elbow flexion  Full elbow ROM painless but stiff sensation    Elbow extension      Wrist flexion      Wrist extension       (Blank rows = not  tested) (Key: WFL = within functional limits not formally assessed, * = concordant pain, s = stiffness/stretching sensation, NT = not tested)  EVAL: Comments: AROM NT given recency of surgery, MD note recommending AAROM/PROM at present  UPPER EXTREMITY MMT:  MMT Right 01/17/2023 Left 01/17/2023  Shoulder flexion    Shoulder extension    Shoulder abduction    Shoulder extension    Shoulder internal rotation    Shoulder external rotation    Elbow flexion    Elbow extension    Grip strength     (Blank rows = not tested)  EVAL: Comments: NT on eval given recency of surgery  SHOULDER SPECIAL TESTS: 12/27/2022 NT given recency of surgery  JOINT MOBILITY TESTING:  12/27/2022 NT  PALPATION:  12/27/2022 No overt TTP throughout shoulder girdle, mild muscular tightness UT/LS but asympomatic  OBSERVATION:  12/27/2022  Visually inspected surgical site w/ pt verbal consent. Appears WNL, healing well, no erythema or significant swelling. No drainage or excessive warmth   TODAY'S TREATMENT:  DATE: 01/17/2023 Therapeutic Exercise:  UBE fwd/back 4 mins each way lvl 2.0 AAROM with 10 second interval faster at end of each minute  Sidelying Lt shoulder AROM abduction 2 x 15 Supine AROM Lt shoulder flexion  x 10 Supine 90 deg flexion hold c circles cw, ccw 30 x 2 each way 1 lb weight  Standing red band ER c towel under arm 2 x 10 Lt  Tband rows Blue  band 2 x 15  Tband GH extension blue  band 2 x 15  bilateral Seated green band 2 x 20 lat pull down  Manual:  Supine Lt shoulder ER mobilization c movement posterior glide in 45 deg abduction to tolerance    TODAY'S TREATMENT:                                                                                              DATE: 01/14/2023 Therapeutic Exercise:  UBE fwd/back 4 mins each way lvl 2.0 AAROM with 10 second interval faster at end of each  minute  Sidelying Lt shoulder AROM abduction 2 x 12 Sidelying Lt shoulder AROM ER c towel at side 2 x 15  Supine AROM bilateral shoulder flexion 2 x 15 in 25 deg bed elevation  Tband rows Green band x20 bilateral Tband GH extension Green band x20 bilateral    TODAY'S TREATMENT:                                                                                              DATE: 01/10/2023 Therapeutic Exercise:  UBE fwd/back 3 mins each way lvl 1.0 AAROM Supine wand AAROM shoulder flexion 1 lb bar x15 Supine AROM bilateral shoulder flexion 2 x 15 Supine wand AROM scapular protraction 3 sec hold x 10 bilateral Supine wand ER to tolerance PROM c wand 5 sec hold x 5 c towel under arm   Tband rows Green band 2 x 15 bilateral Tband GH extension Green band 2 x 15 bilateral  Pulleys AAROM flexion, scaption 2 mins  Standing isometric hold ER against doorframe 5 sec on/off 2 mins   PATIENT EDUCATION: 01/14/2023 Education details: HEP update Person educated: Patient Education method: Programmer, multimedia, Demonstration, Verbal cues, and Handouts Education comprehension: verbalized understanding, returned demonstration  HOME EXERCISE PROGRAM: Access Code: [facility] URL: [email or web address] Date: 01/14/2023 Prepared by: Chyrel Masson  Exercises - Seated Scapular Retraction  - 2-3 x daily - 7 x weekly - 1 sets - 10 reps - Supine Shoulder External Rotation with Dowel  - 2-3 x daily - 7 x weekly - 1 sets - 10 reps - 5 hold - Supine Shoulder Flexion Extension AAROM with Dowel  - 1-2 x daily - 7 x weekly - 1-2 sets - 10-15 reps - 3 hold -  Supine Shoulder Flexion Extension Full Range AROM (Mirrored)  - 1-2 x daily - 7 x weekly - 2-3 sets - 10-15 reps - Supine Scapular Protraction in Flexion with Dumbbells  - 1-2 x daily - 7 x weekly - 1 sets - 10 reps - 3 hold - Sidelying Shoulder Abduction Palm Forward (Mirrored)  - 1-2 x daily - 7 x weekly - 2-3 sets - 10-15 reps - Sidelying Shoulder  External Rotation  - 1-2 x daily - 7 x weekly - 2-3 sets - 10-15 reps  ASSESSMENT:  CLINICAL IMPRESSION: Recheck of active movement showed good improvement in flexion.  Abduction improved but more limited at this time.  Continued emphasis in progression of body weight AROM.  After Monday 01/21/2023, Pt may begin weight activity < 5 lbs per MD note.   OBJECTIVE IMPAIRMENTS: decreased activity tolerance, decreased mobility, decreased ROM, decreased strength, hypomobility, increased edema, impaired flexibility, impaired UE functional use, postural dysfunction, and pain.   ACTIVITY LIMITATIONS: carrying, lifting, sitting, sleeping, bathing, dressing, reach over head, and hygiene/grooming  PARTICIPATION LIMITATIONS: meal prep, cleaning, laundry, driving, community activity, and occupation  PERSONAL FACTORS: 3+ comorbidities: HTN, pulmonary nodules, hx palpitations  are also affecting patient's functional outcome.   REHAB POTENTIAL: Good  CLINICAL DECISION MAKING: Stable/uncomplicated  EVALUATION COMPLEXITY: Low   GOALS: Goals reviewed with patient? No  SHORT TERM GOALS: Target date: 01/31/2023    Pt will demonstrate appropriate understanding and performance of initially prescribed HEP in order to facilitate improved independence with management of symptoms.  Baseline: HEP provided on eval Goal status: on going 01/02/2023   2. Pt will score greater than or equal to 51% on FOTO in order to demonstrate improved perception of function due to symptoms.  Baseline: 38%  Goal status: on going 01/02/2023   LONG TERM GOALS: Target date: 03/07/2023 Pt will score 66% on FOTO in order to demonstrate improved perception of function due to symptoms. Baseline: 38% Goal status: on going 01/02/2023  2.  Pt will demonstrate at least 150 degrees of L active shoulder elevation in order to demonstrate improved tolerance to functional movement patterns such as upper body dressing and reaching overhead.  Baseline:  see ROM chart above - AROM NT on eval given recency of surgery Goal status: on going 01/02/2023  3.  Pt will demonstrate at least 4+/5 shoulder flexion/abduction MMT for improved symmetry of UE strength and improved tolerance to functional movements.  Baseline: see MMT chart above - deferred on eval given recency of surgery Goal status: on going 01/02/2023  4. Pt will report/demonstrate ability to lift up to 10 lbs with less than 2 point increase in pain on NPS in order to indicative improved tolerance/independence with heavier activities around the home.   Baseline: NT given recency of surgery  Goal status: on going 01/02/2023   PLAN:  PT FREQUENCY: 2x/week  PT DURATION: 10 weeks  PLANNED INTERVENTIONS: Therapeutic exercises, Therapeutic activity, Neuromuscular re-education, Balance training, Gait training, Patient/Family education, Self Care, Joint mobilization, Dry Needling, Electrical stimulation, Spinal mobilization, Cryotherapy, Moist heat, scar mobilization, Taping, Vasopneumatic device, Manual therapy, and Re-evaluation; above unless contraindicated  PLAN FOR NEXT SESSION:   After 01/21/2023, can use light weight in activity < 5 lbs.    Scot Jun, PT, DPT, OCS, ATC 01/17/23  3:48 PM

## 2023-01-21 ENCOUNTER — Ambulatory Visit (INDEPENDENT_AMBULATORY_CARE_PROVIDER_SITE_OTHER): Payer: BC Managed Care – PPO | Admitting: Rehabilitative and Restorative Service Providers"

## 2023-01-21 ENCOUNTER — Encounter: Payer: Self-pay | Admitting: Rehabilitative and Restorative Service Providers"

## 2023-01-21 DIAGNOSIS — M6281 Muscle weakness (generalized): Secondary | ICD-10-CM | POA: Diagnosis not present

## 2023-01-21 DIAGNOSIS — R293 Abnormal posture: Secondary | ICD-10-CM | POA: Diagnosis not present

## 2023-01-21 DIAGNOSIS — M25512 Pain in left shoulder: Secondary | ICD-10-CM | POA: Diagnosis not present

## 2023-01-21 DIAGNOSIS — M25612 Stiffness of left shoulder, not elsewhere classified: Secondary | ICD-10-CM

## 2023-01-21 NOTE — Therapy (Signed)
OUTPATIENT PHYSICAL THERAPY TREATMENT   Patient Name: Todd Bailey MRN: [id number] DOB:05/13/1961, 62 y.o., male Today's Date: 01/21/2023  END OF SESSION:  PT End of Session - 01/21/23 1622     Visit Number 7    Number of Visits 21    Date for PT Re-Evaluation 02/21/23    Authorization Type BC[employer]    PT Start Time 1558    PT Stop Time 1638    PT Time Calculation (min) 40 min    Activity Tolerance Patient tolerated treatment well    Behavior During Therapy [facility] for tasks assessed/performed                   Past Medical History:  Diagnosis Date   Arthritis    Elevated coronary artery calcium score 07/2022   96th %'tile   GERD (gastroesophageal reflux disease)    Hypercholesterolemia 08/2020   TLC->imp 12/2020   Hypertension    Obesity, Class I, BMI 30-34.9    Palpitation 04/2020   Zio patch x 3d -->occ PAC and PVC; triggered events correlate with times of PVCs.  Reassured.   Pulmonary nodules    on coronary ca score CT 08/28/22-->f/u CT 3-6 mo (Dr. Einar Gip)   Past Surgical History:  Procedure Laterality Date   BICEPT TENODESIS Left 12/06/2022   Procedure: BICEPS TENODESIS;  Surgeon: Meredith Pel, MD;  Location: [facility];  Service: Orthopedics;  Laterality: Left;   CHOLECYSTECTOMY Right 09/14/2021   Procedure: LAPAROSCOPIC CHOLECYSTECTOMY WITH INTRAOPERATIVE CHOLANGIOGRAM,;  Surgeon: Greer Pickerel, MD;  Location: [facility];  Service: General;  Laterality: Right;   COLONOSCOPY  approx 2018   ? polyp, unclear on recall time--get records.   coron calcium score     08/27/22: 96th%'tile (Dr. Einar Gip)   ESOPHAGEAL DILATION  2001   Dr. Amedeo Plenty.  Had it done twice (GERD-induced stricture)   Heart Rhythm monitoring  04/2020   Zio patch x 3d -->occ PAC and PVC; triggered events correlate with times of PVCs.  Reassured.   LEFT HEART CATH AND CORONARY ANGIOGRAPHY N/A 10/23/2022   Procedure: LEFT HEART CATH AND CORONARY ANGIOGRAPHY;  Surgeon: Adrian Prows, MD;  Location: [facility] [facility];  Service: Cardiovascular;  Laterality: N/A;   SHOULDER ARTHROSCOPY Left 12/06/2022   Procedure: left shoulder arthroscopy, debridement, bursectomy;  Surgeon: Meredith Pel, MD;  Location: [facility];  Service: Orthopedics;  Laterality: Left;   TRANSTHORACIC ECHOCARDIOGRAM  09/28/2022   normal   Patient Active Problem List   Diagnosis Date Noted   Biceps tendinitis of left shoulder 12/08/2022   Degenerative superior labral anterior-to-posterior (SLAP) tear of left shoulder 12/08/2022   Coronary artery disease of native artery of native heart with stable angina pectoris ([place])    Cholelithiasis 09/12/2021   Cholecystitis 09/12/2021   H/O adenomatous polyp of colon 04/05/2016   De Quervain's tenosynovitis, right 12/27/2014   Arthritis 12/06/2014   Essential hypertension 12/06/2014   GERD (gastroesophageal reflux disease) 12/06/2014   Hyperlipemia 12/06/2014   Prediabetes 12/06/2014    PCP: Tammi Sou, MD  REFERRING PROVIDER: Meredith Pel, MD  REFERRING DIAG: [phone] (ICD-10-CM) - Biceps tendinosis of left shoulder M25.512 (ICD-10-CM) - Left shoulder pain, unspecified chronicity M65.812 (ICD-10-CM) - Synovitis of left shoulder  THERAPY DIAG:  Left shoulder pain, unspecified chronicity  Stiffness of left shoulder, not elsewhere classified  Muscle weakness (generalized)  Abnormal posture  Rationale for Evaluation and Treatment: Rehabilitation  ONSET DATE: 12/06/22 Lt shoulder arthroscopy and biceps tenodesis  SUBJECTIVE:  SUBJECTIVE STATEMENT: No complaints of specific pain today.  Reported good routine of HEP since last visit.  Some soreness at times.   PERTINENT HISTORY: GERD, HTN, hx palpitations, pulmonary nodules  PAIN:   Location: Lt shoulder How would you  describe your pain? sore Best in past week: 0-1/10 Aggravating factors: movements, Easing factors: ice, tylenol   PRECAUTIONS: s/p Lt shoulder arthroscopic debridement with biceps tenodesis "Passive range of motion active assisted range of motion of the left shoulder with no biceps resistance exercises." Per 12/14/22 MD note  01/07/2023 MD note update:   No lifting with that left arm for the next 2 weeks. Weeks 2-4 from now he is okay to do lifting of 5 pounds. Weeks 4-6 from now he is okay to lift 10 pounds with the left arm.   WEIGHT BEARING RESTRICTIONS: No  FALLS:  Has patient fallen in last 6 months? No   LIVING ENVIRONMENT: 2 story home, main level livable. 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, desk work and up on feet  PLOF: Independent, get back to playing hockey  PATIENT GOALS: reduce pain, return to PLOF - get back to playing hockey  NEXT MD VISIT: 01/07/23  OBJECTIVE:   DIAGNOSTIC FINDINGS:  12/27/2022 12/06/22 shoulder arthroscopy with biceps tenodesis  PATIENT SURVEYS:  12/27/2022 FOTO 38%, 66 predicted  COGNITION: 12/27/2022 Overall cognitive status: Within functional limits for tasks assessed     SENSATION: 12/27/2022 Light touch grossly intact bilateral UE, pt denies any sensory issues  POSTURE: 12/27/2022 Slightly guarded posture Lt UE, tendency to hold in [facility] IR, forward head  UPPER EXTREMITY ROM:  A/PROM Right 12/27/2022 Left 12/27/2022 Left 01/10/2023 Left 01/17/2023  Shoulder flexion 167/NT NT/92s 120 AROM in supine 135 AROM in supine, AAROM with stick 139  Shoulder abduction 170/NT NT/60s  90 AROM in supine  Shoulder internal rotation      Shoulder external rotation  NT/18 deg s    Elbow flexion  Full elbow ROM painless but stiff sensation    Elbow extension      Wrist flexion      Wrist extension       (Blank rows = not  tested) (Key: WFL = within functional limits not formally assessed, * = concordant pain, s = stiffness/stretching sensation, NT = not tested)  EVAL: Comments: AROM NT given recency of surgery, MD note recommending AAROM/PROM at present  UPPER EXTREMITY MMT:  MMT Right 01/21/2023 Left 01/21/2023  Shoulder flexion  3+/5  Shoulder extension    Shoulder abduction    Shoulder extension    Shoulder internal rotation    Shoulder external rotation    Elbow flexion    Elbow extension    Grip strength     (Blank rows = not tested)  EVAL: Comments: NT on eval given recency of surgery  SHOULDER SPECIAL TESTS: 12/27/2022 NT given recency of surgery  JOINT MOBILITY TESTING:  12/27/2022 NT  PALPATION:  12/27/2022 No overt TTP throughout shoulder girdle, mild muscular tightness UT/LS but asympomatic  OBSERVATION:  12/27/2022  Visually inspected surgical site w/ pt verbal consent. Appears WNL, healing well, no erythema or significant swelling. No drainage or excessive warmth   TODAY'S TREATMENT:  DATE: 01/21/2023 Therapeutic Exercise:  UBE fwd/back 3 mins each way lvl 4.0  Sidelying Lt shoulder AROM abduction 1 lb x 20 Sidelying Lt ER shoulder 1 lb x12, x 15  Seated AROM bilateral to tolerance 2 x 15  Tband rows Blue  band x 20  Tband GH extension blue  band x 20  Seated blue band 2 x 15 lat pull down Standing ball circles outstretched arm in 90 deg flexion 30 x 2 cw, ccw each Lt arm   Supine ER wand stretch in 15 sec hold  x 10    TODAY'S TREATMENT:                                                                                              DATE: 01/17/2023 Therapeutic Exercise:  UBE fwd/back 4 mins each way lvl 2.0 AAROM with 10 second interval faster at end of each minute  Sidelying Lt shoulder AROM abduction 2 x 15 Supine AROM Lt shoulder flexion  x 10 Supine 90 deg flexion hold c  circles cw, ccw 30 x 2 each way 1 lb weight  Standing red band ER c towel under arm 2 x 10 Lt  Tband rows Blue  band 2 x 15  Tband GH extension blue  band 2 x 15  bilateral Seated green band 2 x 20 lat pull down  Manual:  Supine Lt shoulder ER mobilization c movement posterior glide in 45 deg abduction to tolerance    TODAY'S TREATMENT:                                                                                              DATE: 01/14/2023 Therapeutic Exercise:  UBE fwd/back 4 mins each way lvl 2.0 AAROM with 10 second interval faster at end of each minute  Sidelying Lt shoulder AROM abduction 2 x 12 Sidelying Lt shoulder AROM ER c towel at side 2 x 15  Supine AROM bilateral shoulder flexion 2 x 15 in 25 deg bed elevation  Tband rows Green band x20 bilateral Tband GH extension Green band x20 bilateral    TODAY'S TREATMENT:                                                                                              DATE: 01/10/2023 Therapeutic Exercise:  UBE fwd/back 3 mins each way lvl 1.0 AAROM Supine  wand AAROM shoulder flexion 1 lb bar x15 Supine AROM bilateral shoulder flexion 2 x 15 Supine wand AROM scapular protraction 3 sec hold x 10 bilateral Supine wand ER to tolerance PROM c wand 5 sec hold x 5 c towel under arm   Tband rows Green band 2 x 15 bilateral Tband GH extension Green band 2 x 15 bilateral  Pulleys AAROM flexion, scaption 2 mins  Standing isometric hold ER against doorframe 5 sec on/off 2 mins   PATIENT EDUCATION: 01/14/2023 Education details: HEP update Person educated: Patient Education method: Programmer, multimedia, Demonstration, Verbal cues, and Handouts Education comprehension: verbalized understanding, returned demonstration  HOME EXERCISE PROGRAM: Access Code: [facility] URL: [email or web address] Date: 01/14/2023 Prepared by: Chyrel Masson  Exercises - Seated Scapular Retraction  - 2-3 x daily - 7 x weekly - 1 sets - 10 reps -  Supine Shoulder External Rotation with Dowel  - 2-3 x daily - 7 x weekly - 1 sets - 10 reps - 5 hold - Supine Shoulder Flexion Extension AAROM with Dowel  - 1-2 x daily - 7 x weekly - 1-2 sets - 10-15 reps - 3 hold - Supine Shoulder Flexion Extension Full Range AROM (Mirrored)  - 1-2 x daily - 7 x weekly - 2-3 sets - 10-15 reps - Supine Scapular Protraction in Flexion with Dumbbells  - 1-2 x daily - 7 x weekly - 1 sets - 10 reps - 3 hold - Sidelying Shoulder Abduction Palm Forward (Mirrored)  - 1-2 x daily - 7 x weekly - 2-3 sets - 10-15 reps - Sidelying Shoulder External Rotation  - 1-2 x daily - 7 x weekly - 2-3 sets - 10-15 reps  ASSESSMENT:  CLINICAL IMPRESSION: Able to utilize light weight.  Fatigue noted in increased demand but no adverse reactions.   OBJECTIVE IMPAIRMENTS: decreased activity tolerance, decreased mobility, decreased ROM, decreased strength, hypomobility, increased edema, impaired flexibility, impaired UE functional use, postural dysfunction, and pain.   ACTIVITY LIMITATIONS: carrying, lifting, sitting, sleeping, bathing, dressing, reach over head, and hygiene/grooming  PARTICIPATION LIMITATIONS: meal prep, cleaning, laundry, driving, community activity, and occupation  PERSONAL FACTORS: 3+ comorbidities: HTN, pulmonary nodules, hx palpitations  are also affecting patient's functional outcome.   REHAB POTENTIAL: Good  CLINICAL DECISION MAKING: Stable/uncomplicated  EVALUATION COMPLEXITY: Low   GOALS: Goals reviewed with patient? No  SHORT TERM GOALS: Target date: 01/31/2023    Pt will demonstrate appropriate understanding and performance of initially prescribed HEP in order to facilitate improved independence with management of symptoms.  Baseline: HEP provided on eval Goal status: on going 01/02/2023   2. Pt will score greater than or equal to 51% on FOTO in order to demonstrate improved perception of function due to symptoms.  Baseline: 38%  Goal status: on  going 01/02/2023   LONG TERM GOALS: Target date: 03/07/2023 Pt will score 66% on FOTO in order to demonstrate improved perception of function due to symptoms. Baseline: 38% Goal status: on going 01/02/2023  2.  Pt will demonstrate at least 150 degrees of L active shoulder elevation in order to demonstrate improved tolerance to functional movement patterns such as upper body dressing and reaching overhead.  Baseline: see ROM chart above - AROM NT on eval given recency of surgery Goal status: on going 01/02/2023  3.  Pt will demonstrate at least 4+/5 shoulder flexion/abduction MMT for improved symmetry of UE strength and improved tolerance to functional movements.  Baseline: see MMT chart above - deferred on eval given recency of surgery  Goal status: on going 01/02/2023  4. Pt will report/demonstrate ability to lift up to 10 lbs with less than 2 point increase in pain on NPS in order to indicative improved tolerance/independence with heavier activities around the home.   Baseline: NT given recency of surgery  Goal status: on going 01/02/2023   PLAN:  PT FREQUENCY: 2x/week  PT DURATION: 10 weeks  PLANNED INTERVENTIONS: Therapeutic exercises, Therapeutic activity, Neuromuscular re-education, Balance training, Gait training, Patient/Family education, Self Care, Joint mobilization, Dry Needling, Electrical stimulation, Spinal mobilization, Cryotherapy, Moist heat, scar mobilization, Taping, Vasopneumatic device, Manual therapy, and Re-evaluation; above unless contraindicated  PLAN FOR NEXT SESSION:   Light weight strengthening .  No lifting > 5 lbs.     Chyrel Masson, PT, DPT, OCS, ATC 01/21/23  4:38 PM

## 2023-01-23 ENCOUNTER — Ambulatory Visit (INDEPENDENT_AMBULATORY_CARE_PROVIDER_SITE_OTHER): Payer: BC Managed Care – PPO | Admitting: Rehabilitative and Restorative Service Providers"

## 2023-01-23 ENCOUNTER — Encounter: Payer: Self-pay | Admitting: Rehabilitative and Restorative Service Providers"

## 2023-01-23 DIAGNOSIS — M25612 Stiffness of left shoulder, not elsewhere classified: Secondary | ICD-10-CM

## 2023-01-23 DIAGNOSIS — M25512 Pain in left shoulder: Secondary | ICD-10-CM | POA: Diagnosis not present

## 2023-01-23 DIAGNOSIS — M6281 Muscle weakness (generalized): Secondary | ICD-10-CM | POA: Diagnosis not present

## 2023-01-23 DIAGNOSIS — R293 Abnormal posture: Secondary | ICD-10-CM | POA: Diagnosis not present

## 2023-01-23 NOTE — Therapy (Signed)
OUTPATIENT PHYSICAL THERAPY TREATMENT   Patient Name: Todd Bailey MRN: [id number] DOB:1961-09-23, 62 y.o., male Today's Date: 01/23/2023  END OF SESSION:  PT End of Session - 01/23/23 1416     Visit Number 8    Number of Visits 21    Date for PT Re-Evaluation 02/21/23    Authorization Type BC[employer]    PT Start Time 1425    PT Stop Time 1504    PT Time Calculation (min) 39 min    Activity Tolerance Patient tolerated treatment well    Behavior During Therapy [facility] for tasks assessed/performed              Past Medical History:  Diagnosis Date   Arthritis    Elevated coronary artery calcium score 07/2022   96th %'tile   GERD (gastroesophageal reflux disease)    Hypercholesterolemia 08/2020   TLC->imp 12/2020   Hypertension    Obesity, Class I, BMI 30-34.9    Palpitation 04/2020   Zio patch x 3d -->occ PAC and PVC; triggered events correlate with times of PVCs.  Reassured.   Pulmonary nodules    on coronary ca score CT 08/28/22-->f/u CT 3-6 mo (Dr. Einar Gip)   Past Surgical History:  Procedure Laterality Date   BICEPT TENODESIS Left 12/06/2022   Procedure: BICEPS TENODESIS;  Surgeon: Meredith Pel, MD;  Location: [place];  Service: Orthopedics;  Laterality: Left;   CHOLECYSTECTOMY Right 09/14/2021   Procedure: LAPAROSCOPIC CHOLECYSTECTOMY WITH INTRAOPERATIVE CHOLANGIOGRAM,;  Surgeon: Greer Pickerel, MD;  Location: [facility];  Service: General;  Laterality: Right;   COLONOSCOPY  approx 2018   ? polyp, unclear on recall time--get records.   coron calcium score     08/27/22: 96th%'tile (Dr. Einar Gip)   ESOPHAGEAL DILATION  2001   Dr. Amedeo Plenty.  Had it done twice (GERD-induced stricture)   Heart Rhythm monitoring  04/2020   Zio patch x 3d -->occ PAC and PVC; triggered events correlate with times of PVCs.  Reassured.   LEFT HEART CATH AND CORONARY ANGIOGRAPHY N/A 10/23/2022   Procedure: LEFT HEART CATH AND CORONARY ANGIOGRAPHY;  Surgeon: Adrian Prows, MD;  Location: [place] [facility];  Service: Cardiovascular;  Laterality: N/A;   SHOULDER ARTHROSCOPY Left 12/06/2022   Procedure: left shoulder arthroscopy, debridement, bursectomy;  Surgeon: Meredith Pel, MD;  Location: [place];  Service: Orthopedics;  Laterality: Left;   TRANSTHORACIC ECHOCARDIOGRAM  09/28/2022   normal   Patient Active Problem List   Diagnosis Date Noted   Biceps tendinitis of left shoulder 12/08/2022   Degenerative superior labral anterior-to-posterior (SLAP) tear of left shoulder 12/08/2022   Coronary artery disease of native artery of native heart with stable angina pectoris ([place])    Cholelithiasis 09/12/2021   Cholecystitis 09/12/2021   H/O adenomatous polyp of colon 04/05/2016   De Quervain's tenosynovitis, right 12/27/2014   Arthritis 12/06/2014   Essential hypertension 12/06/2014   GERD (gastroesophageal reflux disease) 12/06/2014   Hyperlipemia 12/06/2014   Prediabetes 12/06/2014    PCP: Tammi Sou, MD  REFERRING PROVIDER: Meredith Pel, MD  REFERRING DIAG: [id number] (ICD-10-CM) - Biceps tendinosis of left shoulder M25.512 (ICD-10-CM) - Left shoulder pain, unspecified chronicity M65.812 (ICD-10-CM) - Synovitis of left shoulder  THERAPY DIAG:  Left shoulder pain, unspecified chronicity  Stiffness of left shoulder, not elsewhere classified  Muscle weakness (generalized)  Abnormal posture  Rationale for Evaluation and Treatment: Rehabilitation  ONSET DATE: 12/06/22 Lt shoulder arthroscopy and biceps tenodesis  SUBJECTIVE:  SUBJECTIVE STATEMENT: Pt indicated doing well with new changes of HEP at home.   PERTINENT HISTORY: GERD, HTN, hx palpitations, pulmonary nodules  PAIN:   Location: Lt shoulder How would you describe your pain? sore Best in past week: 0-1/10 Aggravating  factors: movements, Easing factors: ice, tylenol   PRECAUTIONS: s/p Lt shoulder arthroscopic debridement with biceps tenodesis "Passive range of motion active assisted range of motion of the left shoulder with no biceps resistance exercises." Per 12/14/22 MD note  01/07/2023 MD note update:   No lifting with that left arm for the next 2 weeks. Weeks 2-4 from now he is okay to do lifting of 5 pounds. Weeks 4-6 from now he is okay to lift 10 pounds with the left arm.   WEIGHT BEARING RESTRICTIONS: No  FALLS:  Has patient fallen in last 6 months? No   LIVING ENVIRONMENT: 2 story home, main level livable. 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, desk work and up on feet  PLOF: Independent, get back to playing hockey  PATIENT GOALS: reduce pain, return to PLOF - get back to playing hockey  NEXT MD VISIT: 01/07/23  OBJECTIVE:   DIAGNOSTIC FINDINGS:  12/27/2022 12/06/22 shoulder arthroscopy with biceps tenodesis  PATIENT SURVEYS:  12/27/2022 FOTO 38%, 66 predicted  COGNITION: 12/27/2022 Overall cognitive status: Within functional limits for tasks assessed     SENSATION: 12/27/2022 Light touch grossly intact bilateral UE, pt denies any sensory issues  POSTURE: 12/27/2022 Slightly guarded posture Lt UE, tendency to hold in [facility] IR, forward head  UPPER EXTREMITY ROM:  A/PROM Right 12/27/2022 Left 12/27/2022 Left 01/10/2023 Left 01/17/2023  Shoulder flexion 167/NT NT/92s 120 AROM in supine 135 AROM in supine, AAROM with stick 139  Shoulder abduction 170/NT NT/60s  90 AROM in supine  Shoulder internal rotation      Shoulder external rotation  NT/18 deg s    Elbow flexion  Full elbow ROM painless but stiff sensation    Elbow extension      Wrist flexion      Wrist extension       (Blank rows = not tested) (Key: WFL = within functional limits not formally  assessed, * = concordant pain, s = stiffness/stretching sensation, NT = not tested)  EVAL: Comments: AROM NT given recency of surgery, MD note recommending AAROM/PROM at present  UPPER EXTREMITY MMT:  MMT Right 01/21/2023 Left 01/21/2023  Shoulder flexion  3+/5  Shoulder extension    Shoulder abduction    Shoulder extension    Shoulder internal rotation    Shoulder external rotation    Elbow flexion    Elbow extension    Grip strength     (Blank rows = not tested)  EVAL: Comments: NT on eval given recency of surgery  SHOULDER SPECIAL TESTS: 12/27/2022 NT given recency of surgery  JOINT MOBILITY TESTING:  12/27/2022 NT  PALPATION:  12/27/2022 No overt TTP throughout shoulder girdle, mild muscular tightness UT/LS but asympomatic  OBSERVATION:  12/27/2022  Visually inspected surgical site w/ pt verbal consent. Appears WNL, healing well, no erythema or significant swelling. No drainage or excessive warmth   TODAY'S TREATMENT:  DATE: 01/23/2023 Therapeutic Exercise:  UBE fwd/back 4 mins each way lvl 4.0  Standing Green band ER c towel under elbow 2 x 15  Standing green band IR c towel under elbow 2 x 15  Standing wall slide flexion 5 sec hold, x 10 Standing ball circles outstretched arm in 90 deg flexion 30 x 2 cw, ccw each Lt arm 2 lb ball Doorway ER stretch 15 seconds x 5 c elbow at side   Lat pull down 15 lbs x 15 c stretch into flexion focus Standing blue band GH ext from higher attachment point 2-3 sec hold x 20  Standing flexion to 90 deg, move to abduction and lower, perform reverse - bilateral at same time x 10    TODAY'S TREATMENT:                                                                                              DATE: 01/21/2023 Therapeutic Exercise:  UBE fwd/back 3 mins each way lvl 4.0  Sidelying Lt shoulder AROM abduction 1 lb x 20 Sidelying Lt ER shoulder  1 lb x12, x 15  Seated AROM bilateral to tolerance 2 x 15  Tband rows Blue  band x 20  Tband GH extension blue  band x 20  Seated blue band 2 x 15 lat pull down Standing ball circles outstretched arm in 90 deg flexion 30 x 2 cw, ccw each Lt arm   Supine ER wand stretch in 15 sec hold  x 10    TODAY'S TREATMENT:                                                                                              DATE: 01/17/2023 Therapeutic Exercise:  UBE fwd/back 4 mins each way lvl 2.0 AAROM with 10 second interval faster at end of each minute  Sidelying Lt shoulder AROM abduction 2 x 15 Supine AROM Lt shoulder flexion  x 10 Supine 90 deg flexion hold c circles cw, ccw 30 x 2 each way 1 lb weight  Standing red band ER c towel under arm 2 x 10 Lt  Tband rows Blue  band 2 x 15  Tband GH extension blue  band 2 x 15  bilateral Seated green band 2 x 20 lat pull down  Manual:  Supine Lt shoulder ER mobilization c movement posterior glide in 45 deg abduction to tolerance    PATIENT EDUCATION: 01/14/2023 Education details: HEP update Person educated: Patient Education method: Programmer, multimedia, Demonstration, Verbal cues, and Handouts Education comprehension: verbalized understanding, returned demonstration  HOME EXERCISE PROGRAM: Access Code: [email or web address] URL: [email or web address] Date: 01/14/2023 Prepared by: Chyrel Masson  Exercises - Seated Scapular Retraction  - 2-3 x daily - 7 x weekly - 1 sets -  10 reps - Supine Shoulder External Rotation with Dowel  - 2-3 x daily - 7 x weekly - 1 sets - 10 reps - 5 hold - Supine Shoulder Flexion Extension AAROM with Dowel  - 1-2 x daily - 7 x weekly - 1-2 sets - 10-15 reps - 3 hold - Supine Shoulder Flexion Extension Full Range AROM (Mirrored)  - 1-2 x daily - 7 x weekly - 2-3 sets - 10-15 reps - Supine Scapular Protraction in Flexion with Dumbbells  - 1-2 x daily - 7 x weekly - 1 sets - 10 reps - 3 hold - Sidelying Shoulder Abduction Palm  Forward (Mirrored)  - 1-2 x daily - 7 x weekly - 2-3 sets - 10-15 reps - Sidelying Shoulder External Rotation  - 1-2 x daily - 7 x weekly - 2-3 sets - 10-15 reps  ASSESSMENT:  CLINICAL IMPRESSION: Continued good improvement rate in active movement ability.  Continued progression with various light resistance interventions.  Capsular tightness into ER/horizontal abduction noted for Lt shoulder.   OBJECTIVE IMPAIRMENTS: decreased activity tolerance, decreased mobility, decreased ROM, decreased strength, hypomobility, increased edema, impaired flexibility, impaired UE functional use, postural dysfunction, and pain.   ACTIVITY LIMITATIONS: carrying, lifting, sitting, sleeping, bathing, dressing, reach over head, and hygiene/grooming  PARTICIPATION LIMITATIONS: meal prep, cleaning, laundry, driving, community activity, and occupation  PERSONAL FACTORS: 3+ comorbidities: HTN, pulmonary nodules, hx palpitations  are also affecting patient's functional outcome.   REHAB POTENTIAL: Good  CLINICAL DECISION MAKING: Stable/uncomplicated  EVALUATION COMPLEXITY: Low   GOALS: Goals reviewed with patient? No  SHORT TERM GOALS: Target date: 01/31/2023    Pt will demonstrate appropriate understanding and performance of initially prescribed HEP in order to facilitate improved independence with management of symptoms.  Baseline: HEP provided on eval Goal status: Met 01/23/2023  2. Pt will score greater than or equal to 51% on FOTO in order to demonstrate improved perception of function due to symptoms.  Baseline: 38%  Goal status: on going 01/02/2023   LONG TERM GOALS: Target date: 03/07/2023 Pt will score 66% on FOTO in order to demonstrate improved perception of function due to symptoms. Baseline: 38% Goal status: on going 01/02/2023  2.  Pt will demonstrate at least 150 degrees of L active shoulder elevation in order to demonstrate improved tolerance to functional movement patterns such as upper body  dressing and reaching overhead.  Baseline: see ROM chart above - AROM NT on eval given recency of surgery Goal status: on going 01/02/2023  3.  Pt will demonstrate at least 4+/5 shoulder flexion/abduction MMT for improved symmetry of UE strength and improved tolerance to functional movements.  Baseline: see MMT chart above - deferred on eval given recency of surgery Goal status: on going 01/02/2023  4. Pt will report/demonstrate ability to lift up to 10 lbs with less than 2 point increase in pain on NPS in order to indicative improved tolerance/independence with heavier activities around the home.   Baseline: NT given recency of surgery  Goal status: on going 01/02/2023   PLAN:  PT FREQUENCY: 2x/week  PT DURATION: 10 weeks  PLANNED INTERVENTIONS: Therapeutic exercises, Therapeutic activity, Neuromuscular re-education, Balance training, Gait training, Patient/Family education, Self Care, Joint mobilization, Dry Needling, Electrical stimulation, Spinal mobilization, Cryotherapy, Moist heat, scar mobilization, Taping, Vasopneumatic device, Manual therapy, and Re-evaluation; above unless contraindicated  PLAN FOR NEXT SESSION:   Light weight strengthening .  No lifting > 5 lbs continued.     Scot Jun, PT, DPT, OCS, ATC  01/23/23  3:03 PM

## 2023-01-24 ENCOUNTER — Encounter: Payer: BC Managed Care – PPO | Admitting: Rehabilitative and Restorative Service Providers"

## 2023-01-28 ENCOUNTER — Ambulatory Visit (INDEPENDENT_AMBULATORY_CARE_PROVIDER_SITE_OTHER): Payer: BC Managed Care – PPO | Admitting: Rehabilitative and Restorative Service Providers"

## 2023-01-28 ENCOUNTER — Encounter: Payer: Self-pay | Admitting: Rehabilitative and Restorative Service Providers"

## 2023-01-28 DIAGNOSIS — M25512 Pain in left shoulder: Secondary | ICD-10-CM | POA: Diagnosis not present

## 2023-01-28 DIAGNOSIS — M6281 Muscle weakness (generalized): Secondary | ICD-10-CM

## 2023-01-28 DIAGNOSIS — R293 Abnormal posture: Secondary | ICD-10-CM

## 2023-01-28 DIAGNOSIS — M25612 Stiffness of left shoulder, not elsewhere classified: Secondary | ICD-10-CM | POA: Diagnosis not present

## 2023-01-28 NOTE — Therapy (Signed)
OUTPATIENT PHYSICAL THERAPY TREATMENT   Patient Name: Todd Bailey MRN: [id number] DOB:[birth year], 62 y.o., male Today's Date: 01/28/2023  END OF SESSION:  PT End of Session - 01/28/23 1550     Visit Number 9    Number of Visits 21    Date for PT Re-Evaluation 02/21/23    Authorization Type [employer]    PT Start Time 1550    PT Stop Time 1630    PT Time Calculation (min) 40 min    Activity Tolerance Patient tolerated treatment well    Behavior During Therapy [facility] for tasks assessed/performed               Past Medical History:  Diagnosis Date   Arthritis    Elevated coronary artery calcium score 07/2022   96th %'tile   GERD (gastroesophageal reflux disease)    Hypercholesterolemia 08/2020   TLC->imp 12/2020   Hypertension    Obesity, Class I, BMI 30-34.9    Palpitation 04/2020   Zio patch x 3d -->occ PAC and PVC; triggered events correlate with times of PVCs.  Reassured.   Pulmonary nodules    on coronary ca score CT 08/28/22-->f/u CT 3-6 mo (Dr. Einar Gip)   Past Surgical History:  Procedure Laterality Date   BICEPT TENODESIS Left 12/06/2022   Procedure: BICEPS TENODESIS;  Surgeon: Meredith Pel, MD;  Location: [place];  Service: Orthopedics;  Laterality: Left;   CHOLECYSTECTOMY Right 09/14/2021   Procedure: LAPAROSCOPIC CHOLECYSTECTOMY WITH INTRAOPERATIVE CHOLANGIOGRAM,;  Surgeon: Greer Pickerel, MD;  Location: [facility];  Service: General;  Laterality: Right;   COLONOSCOPY  approx 2018   ? polyp, unclear on recall time--get records.   coron calcium score     08/27/22: 96th%'tile (Dr. Einar Gip)   ESOPHAGEAL DILATION  2001   Dr. Amedeo Plenty.  Had it done twice (GERD-induced stricture)   Heart Rhythm monitoring  04/2020   Zio patch x 3d -->occ PAC and PVC; triggered events correlate with times of PVCs.  Reassured.   LEFT HEART CATH AND CORONARY ANGIOGRAPHY N/A 10/23/2022   Procedure: LEFT HEART CATH AND CORONARY ANGIOGRAPHY;  Surgeon: Adrian Prows, MD;  Location: [place] [facility];  Service: Cardiovascular;  Laterality: N/A;   SHOULDER ARTHROSCOPY Left 12/06/2022   Procedure: left shoulder arthroscopy, debridement, bursectomy;  Surgeon: Meredith Pel, MD;  Location: [place];  Service: Orthopedics;  Laterality: Left;   TRANSTHORACIC ECHOCARDIOGRAM  09/28/2022   normal   Patient Active Problem List   Diagnosis Date Noted   Biceps tendinitis of left shoulder 12/08/2022   Degenerative superior labral anterior-to-posterior (SLAP) tear of left shoulder 12/08/2022   Coronary artery disease of native artery of native heart with stable angina pectoris ([place])    Cholelithiasis 09/12/2021   Cholecystitis 09/12/2021   H/O adenomatous polyp of colon 04/05/2016   De Quervain's tenosynovitis, right 12/27/2014   Arthritis 12/06/2014   Essential hypertension 12/06/2014   GERD (gastroesophageal reflux disease) 12/06/2014   Hyperlipemia 12/06/2014   Prediabetes 12/06/2014    PCP: Tammi Sou, MD  REFERRING PROVIDER: Meredith Pel, MD  REFERRING DIAG: [id number] (ICD-10-CM) - Biceps tendinosis of left shoulder M25.512 (ICD-10-CM) - Left shoulder pain, unspecified chronicity M65.812 (ICD-10-CM) - Synovitis of left shoulder  THERAPY DIAG:  Left shoulder pain, unspecified chronicity  Stiffness of left shoulder, not elsewhere classified  Muscle weakness (generalized)  Abnormal posture  Rationale for Evaluation and Treatment: Rehabilitation  ONSET DATE: 12/06/22 Lt shoulder arthroscopy and biceps tenodesis  SUBJECTIVE:  SUBJECTIVE STATEMENT: He indicated no complaints since last visit.   PERTINENT HISTORY: GERD, HTN, hx palpitations, pulmonary nodules  PAIN:   Location: Lt shoulder How would you describe your pain? sore Best in past week: 0/10 Aggravating factors:  movements, Easing factors: ice, tylenol   PRECAUTIONS: s/p Lt shoulder arthroscopic debridement with biceps tenodesis "Passive range of motion active assisted range of motion of the left shoulder with no biceps resistance exercises." Per 12/14/22 MD note  01/07/2023 MD note update:   No lifting with that left arm for the next 2 weeks. Weeks 2-4 from now he is okay to do lifting of 5 pounds. Weeks 4-6 from now he is okay to lift 10 pounds with the left arm.   WEIGHT BEARING RESTRICTIONS: No  FALLS:  Has patient fallen in last 6 months? No   LIVING ENVIRONMENT: 2 story home, main level livable. 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, desk work and up on feet  PLOF: Independent, get back to playing hockey  PATIENT GOALS: reduce pain, return to PLOF - get back to playing hockey  NEXT MD VISIT:   OBJECTIVE:   DIAGNOSTIC FINDINGS:  12/27/2022 12/06/22 shoulder arthroscopy with biceps tenodesis  PATIENT SURVEYS:  01/28/2023:  FOTO update 55  12/27/2022 FOTO 38%, 66 predicted  COGNITION: 12/27/2022 Overall cognitive status: Within functional limits for tasks assessed     SENSATION: 12/27/2022 Light touch grossly intact bilateral UE, pt denies any sensory issues  POSTURE: 12/27/2022 Slightly guarded posture Lt UE, tendency to hold in [facility] IR, forward head  UPPER EXTREMITY ROM:  A/PROM Right 12/27/2022 Left 12/27/2022 Left 01/10/2023 Left 01/17/2023 Left 01/28/2023  Shoulder flexion 167/NT NT/92s 120 AROM in supine 135 AROM in supine, AAROM with stick 139 145 AROM in supine  Shoulder abduction 170/NT NT/60s  90 AROM in supine 109 AROM in supine  Shoulder internal rotation     64 AROM in 45 deg abduction supine  Shoulder external rotation  NT/18 deg s   47 AROM in 45 deg abduction supine  Elbow flexion  Full elbow ROM painless but stiff sensation     Elbow  extension       Wrist flexion       Wrist extension        (Blank rows = not tested) (Key: WFL = within functional limits not formally assessed, * = concordant pain, s = stiffness/stretching sensation, NT = not tested)  EVAL: Comments: AROM NT given recency of surgery, MD note recommending AAROM/PROM at present  UPPER EXTREMITY MMT:  MMT Right 01/21/2023 Left 01/21/2023  Shoulder flexion  3+/5  Shoulder extension    Shoulder abduction    Shoulder extension    Shoulder internal rotation    Shoulder external rotation    Elbow flexion    Elbow extension    Grip strength     (Blank rows = not tested)  EVAL: Comments: NT on eval given recency of surgery  SHOULDER SPECIAL TESTS: 12/27/2022 NT given recency of surgery  JOINT MOBILITY TESTING:  12/27/2022 NT  PALPATION:  12/27/2022 No overt TTP throughout shoulder girdle, mild muscular tightness UT/LS but asympomatic  OBSERVATION:  12/27/2022  Visually inspected surgical site w/ pt verbal consent. Appears WNL, healing well, no erythema or significant swelling. No drainage or excessive warmth   TODAY'S TREATMENT:  DATE: 01/23/2023 Manual:  Supine Lt shoulder inferior glides g4, mobilization c movement posterior glide with passive ER.  Contract relax for IR.   Therapeutic Exercise:  UBE fwd/back 4 mins each way lvl 4.0  Partial Lt sidelying sleeper stretch 30 sec x 3 c cues for home use   Doorway ER stretch 30 seconds x 5 c elbow at side  Sidelying Lt shoulder ER 2 lb 2 x 15 Sidleying Lt shoulder abduction 2 lb 2 x 15 Standing flexion to 90 deg, move to abduction and lower, perform reverse - bilateral at same time x 10    TODAY'S TREATMENT:                                                                                              DATE: 01/21/2023 Therapeutic Exercise:  UBE fwd/back 3 mins each way lvl 4.0  Sidelying Lt shoulder  AROM abduction 1 lb x 20 Sidelying Lt ER shoulder 1 lb x12, x 15  Seated AROM bilateral to tolerance 2 x 15  Tband rows Blue  band x 20  Tband GH extension blue  band x 20  Seated blue band 2 x 15 lat pull down Standing ball circles outstretched arm in 90 deg flexion 30 x 2 cw, ccw each Lt arm   Supine ER wand stretch in 15 sec hold  x 10     PATIENT EDUCATION: 01/14/2023 Education details: HEP update Person educated: Patient Education method: Programmer, multimedia, Demonstration, Verbal cues, and Handouts Education comprehension: verbalized understanding, returned demonstration  HOME EXERCISE PROGRAM: Access Code: [facility] URL: [email or web address] Date: 01/28/2023 Prepared by: Chyrel Masson  Exercises - Seated Scapular Retraction  - 2-3 x daily - 7 x weekly - 1 sets - 10 reps - Supine Shoulder Flexion Extension AAROM with Dowel  - 1-2 x daily - 7 x weekly - 1-2 sets - 10-15 reps - 3 hold - Supine Shoulder Flexion Extension Full Range AROM (Mirrored)  - 1-2 x daily - 7 x weekly - 2-3 sets - 10-15 reps - Supine Scapular Protraction in Flexion with Dumbbells  - 1-2 x daily - 7 x weekly - 1 sets - 10 reps - 3 hold - Sidelying Shoulder Abduction Palm Forward (Mirrored)  - 1-2 x daily - 7 x weekly - 2-3 sets - 10-15 reps - Sidelying Shoulder External Rotation  - 1-2 x daily - 7 x weekly - 2-3 sets - 10-15 reps - Sleeper Stretch  - 2 x daily - 7 x weekly - 1 sets - 5 reps - 30 hold - Single Arm Doorway Pec Stretch at 90 Degrees Abduction (Mirrored)  - 2-3 x daily - 7 x weekly - 1 sets - 5 reps - 30 hold  ASSESSMENT:  CLINICAL IMPRESSION: Recheck of mobility showed gains in all directions compared to last measurement but improvements to still be had superficially in rotations/abduction.   Continued skilled PT services indicated.   OBJECTIVE IMPAIRMENTS: decreased activity tolerance, decreased mobility, decreased ROM, decreased strength, hypomobility, increased edema, impaired  flexibility, impaired UE functional use, postural dysfunction, and pain.   ACTIVITY LIMITATIONS: carrying, lifting, sitting, sleeping, bathing,  dressing, reach over head, and hygiene/grooming  PARTICIPATION LIMITATIONS: meal prep, cleaning, laundry, driving, community activity, and occupation  PERSONAL FACTORS: 3+ comorbidities: HTN, pulmonary nodules, hx palpitations  are also affecting patient's functional outcome.   REHAB POTENTIAL: Good  CLINICAL DECISION MAKING: Stable/uncomplicated  EVALUATION COMPLEXITY: Low   GOALS: Goals reviewed with patient? No  SHORT TERM GOALS: Target date: 01/31/2023    Pt will demonstrate appropriate understanding and performance of initially prescribed HEP in order to facilitate improved independence with management of symptoms.  Baseline: HEP provided on eval Goal status: Met 01/23/2023  2. Pt will score greater than or equal to 51% on FOTO in order to demonstrate improved perception of function due to symptoms.  Baseline: 38%  Goal status: Met 01/28/2023  LONG TERM GOALS: Target date: 03/07/2023 Pt will score 66% on FOTO in order to demonstrate improved perception of function due to symptoms. Baseline: 38% Goal status: on going 01/02/2023  2.  Pt will demonstrate at least 150 degrees of L active shoulder elevation in order to demonstrate improved tolerance to functional movement patterns such as upper body dressing and reaching overhead.  Baseline: see ROM chart above - AROM NT on eval given recency of surgery Goal status: on going 01/02/2023  3.  Pt will demonstrate at least 4+/5 shoulder flexion/abduction MMT for improved symmetry of UE strength and improved tolerance to functional movements.  Baseline: see MMT chart above - deferred on eval given recency of surgery Goal status: on going 01/02/2023  4. Pt will report/demonstrate ability to lift up to 10 lbs with less than 2 point increase in pain on NPS in order to indicative improved  tolerance/independence with heavier activities around the home.   Baseline: NT given recency of surgery  Goal status: on going 01/02/2023   PLAN:  PT FREQUENCY: 2x/week  PT DURATION: 10 weeks  PLANNED INTERVENTIONS: Therapeutic exercises, Therapeutic activity, Neuromuscular re-education, Balance training, Gait training, Patient/Family education, Self Care, Joint mobilization, Dry Needling, Electrical stimulation, Spinal mobilization, Cryotherapy, Moist heat, scar mobilization, Taping, Vasopneumatic device, Manual therapy, and Re-evaluation; above unless contraindicated  PLAN FOR NEXT SESSION:   Light weight strengthening , mobility   No lifting > 5 lbs continued.     Scot Jun, PT, DPT, OCS, ATC 01/28/23  4:28 PM

## 2023-01-31 ENCOUNTER — Ambulatory Visit (INDEPENDENT_AMBULATORY_CARE_PROVIDER_SITE_OTHER): Payer: BC Managed Care – PPO | Admitting: Rehabilitative and Restorative Service Providers"

## 2023-01-31 ENCOUNTER — Encounter: Payer: Self-pay | Admitting: Rehabilitative and Restorative Service Providers"

## 2023-01-31 DIAGNOSIS — R293 Abnormal posture: Secondary | ICD-10-CM

## 2023-01-31 DIAGNOSIS — M6281 Muscle weakness (generalized): Secondary | ICD-10-CM | POA: Diagnosis not present

## 2023-01-31 DIAGNOSIS — M25512 Pain in left shoulder: Secondary | ICD-10-CM | POA: Diagnosis not present

## 2023-01-31 DIAGNOSIS — M25612 Stiffness of left shoulder, not elsewhere classified: Secondary | ICD-10-CM

## 2023-01-31 NOTE — Therapy (Signed)
OUTPATIENT PHYSICAL THERAPY TREATMENT   Patient Name: EZIO WIECK MRN: [id number] DOB:[birth year], 62 y.o., male Today's Date: 01/31/2023  END OF SESSION:  PT End of Session - 01/31/23 1555     Visit Number 10    Number of Visits 21    Date for PT Re-Evaluation 02/21/23    Authorization Type BC[employer]    PT Start Time 1551    PT Stop Time 1631    PT Time Calculation (min) 40 min    Activity Tolerance Patient tolerated treatment well    Behavior During Therapy [facility] for tasks assessed/performed                Past Medical History:  Diagnosis Date   Arthritis    Elevated coronary artery calcium score 07/2022   96th %'tile   GERD (gastroesophageal reflux disease)    Hypercholesterolemia 08/2020   TLC->imp 12/2020   Hypertension    Obesity, Class I, BMI 30-34.9    Palpitation 04/2020   Zio patch x 3d -->occ PAC and PVC; triggered events correlate with times of PVCs.  Reassured.   Pulmonary nodules    on coronary ca score CT 08/28/22-->f/u CT 3-6 mo (Dr. Einar Gip)   Past Surgical History:  Procedure Laterality Date   BICEPT TENODESIS Left 12/06/2022   Procedure: BICEPS TENODESIS;  Surgeon: Meredith Pel, MD;  Location: [place];  Service: Orthopedics;  Laterality: Left;   CHOLECYSTECTOMY Right 09/14/2021   Procedure: LAPAROSCOPIC CHOLECYSTECTOMY WITH INTRAOPERATIVE CHOLANGIOGRAM,;  Surgeon: Greer Pickerel, MD;  Location: [facility];  Service: General;  Laterality: Right;   COLONOSCOPY  approx 2018   ? polyp, unclear on recall time--get records.   coron calcium score     08/27/22: 96th%'tile (Dr. Einar Gip)   ESOPHAGEAL DILATION  2001   Dr. Amedeo Plenty.  Had it done twice (GERD-induced stricture)   Heart Rhythm monitoring  04/2020   Zio patch x 3d -->occ PAC and PVC; triggered events correlate with times of PVCs.  Reassured.   LEFT HEART CATH AND CORONARY ANGIOGRAPHY N/A 10/23/2022   Procedure: LEFT HEART CATH AND CORONARY ANGIOGRAPHY;  Surgeon: Adrian Prows, MD;  Location: [place]  [facility];  Service: Cardiovascular;  Laterality: N/A;   SHOULDER ARTHROSCOPY Left 12/06/2022   Procedure: left shoulder arthroscopy, debridement, bursectomy;  Surgeon: Meredith Pel, MD;  Location: [place];  Service: Orthopedics;  Laterality: Left;   TRANSTHORACIC ECHOCARDIOGRAM  09/28/2022   normal   Patient Active Problem List   Diagnosis Date Noted   Biceps tendinitis of left shoulder 12/08/2022   Degenerative superior labral anterior-to-posterior (SLAP) tear of left shoulder 12/08/2022   Coronary artery disease of native artery of native heart with stable angina pectoris ([place])    Cholelithiasis 09/12/2021   Cholecystitis 09/12/2021   H/O adenomatous polyp of colon 04/05/2016   De Quervain's tenosynovitis, right 12/27/2014   Arthritis 12/06/2014   Essential hypertension 12/06/2014   GERD (gastroesophageal reflux disease) 12/06/2014   Hyperlipemia 12/06/2014   Prediabetes 12/06/2014    PCP: Tammi Sou, MD  REFERRING PROVIDER: Meredith Pel, MD  REFERRING DIAG: [phone] (ICD-10-CM) - Biceps tendinosis of left shoulder M25.512 (ICD-10-CM) - Left shoulder pain, unspecified chronicity M65.812 (ICD-10-CM) - Synovitis of left shoulder  THERAPY DIAG:  Left shoulder pain, unspecified chronicity  Stiffness of left shoulder, not elsewhere classified  Muscle weakness (generalized)  Abnormal posture  Rationale for Evaluation and Treatment: Rehabilitation  ONSET DATE: 12/06/22 Lt shoulder arthroscopy and biceps tenodesis  SUBJECTIVE:  SUBJECTIVE STATEMENT: Stated no new complaints today.    PERTINENT HISTORY: GERD, HTN, hx palpitations, pulmonary nodules  PAIN:   Location: Lt shoulder How would you describe your pain? sore Best in past week: 0/10 Aggravating factors:  movements, Easing factors: ice, tylenol   PRECAUTIONS: s/p Lt shoulder arthroscopic debridement with biceps tenodesis "Passive range of motion active assisted range of motion of the left shoulder with no biceps resistance exercises." Per 12/14/22 MD note  01/07/2023 MD note update:   No lifting with that left arm for the next 2 weeks. Weeks 2-4 from now he is okay to do lifting of 5 pounds. Weeks 4-6 from now he is okay to lift 10 pounds with the left arm.   WEIGHT BEARING RESTRICTIONS: No  FALLS:  Has patient fallen in last 6 months? No   LIVING ENVIRONMENT: 2 story home, main level livable. 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, desk work and up on feet  PLOF: Independent, get back to playing hockey  PATIENT GOALS: reduce pain, return to PLOF - get back to playing hockey  NEXT MD VISIT:   OBJECTIVE:   DIAGNOSTIC FINDINGS:  12/27/2022 12/06/22 shoulder arthroscopy with biceps tenodesis  PATIENT SURVEYS:  01/28/2023:  FOTO update 55  12/27/2022 FOTO 38%, 66 predicted  COGNITION: 12/27/2022 Overall cognitive status: Within functional limits for tasks assessed     SENSATION: 12/27/2022 Light touch grossly intact bilateral UE, pt denies any sensory issues  POSTURE: 12/27/2022 Slightly guarded posture Lt UE, tendency to hold in [facility] IR, forward head  UPPER EXTREMITY ROM:  A/PROM Right 12/27/2022 Left 12/27/2022 Left 01/10/2023 Left 01/17/2023 Left 01/28/2023  Shoulder flexion 167/NT NT/92s 120 AROM in supine 135 AROM in supine, AAROM with stick 139 145 AROM in supine  Shoulder abduction 170/NT NT/60s  90 AROM in supine 109 AROM in supine  Shoulder internal rotation     64 AROM in 45 deg abduction supine  Shoulder external rotation  NT/18 deg s   47 AROM in 45 deg abduction supine  Elbow flexion  Full elbow ROM painless but stiff sensation     Elbow  extension       Wrist flexion       Wrist extension        (Blank rows = not tested) (Key: WFL = within functional limits not formally assessed, * = concordant pain, s = stiffness/stretching sensation, NT = not tested)  EVAL: Comments: AROM NT given recency of surgery, MD note recommending AAROM/PROM at present  UPPER EXTREMITY MMT:  MMT Right 01/21/2023 Left 01/21/2023  Shoulder flexion  3+/5  Shoulder extension    Shoulder abduction    Shoulder extension    Shoulder internal rotation    Shoulder external rotation    Elbow flexion    Elbow extension    Grip strength     (Blank rows = not tested)  EVAL: Comments: NT on eval given recency of surgery  SHOULDER SPECIAL TESTS: 12/27/2022 NT given recency of surgery  JOINT MOBILITY TESTING:  12/27/2022 NT  PALPATION:  12/27/2022 No overt TTP throughout shoulder girdle, mild muscular tightness UT/LS but asympomatic  OBSERVATION:  12/27/2022  Visually inspected surgical site w/ pt verbal consent. Appears WNL, healing well, no erythema or significant swelling. No drainage or excessive warmth   TODAY'S TREATMENT:  DATE: 01/31/2023 Manual:  Supine Lt shoulder inferior glides g4, mobilization c movement posterior glide with passive ER.  Contract relax for IR.   Therapeutic Exercise:  UBE fwd/back 4 mins each way lvl 4.0  Standing green band ER c arm at side Lt 3 x 15  Doorway ER stretch 30 seconds x 5 c elbow at side  Sitting flexion superset - x10 together, x 10 with one arm in 90 deg hold then switch for x10, then x 10 together - 1 lb  Sitting scaption superset - x10 together, x 10 with one arm in 90 deg hold then switch for x10, then x 10 together - 1 lb   TODAY'S TREATMENT:                                                                                              DATE: 01/23/2023 Manual:  Supine Lt shoulder inferior glides g4,  mobilization c movement posterior glide with passive ER.  Contract relax for IR.   Therapeutic Exercise:  UBE fwd/back 4 mins each way lvl 4.0  Partial Lt sidelying sleeper stretch 30 sec x 3 c cues for home use   Doorway ER stretch 30 seconds x 5 c elbow at side  Sidelying Lt shoulder ER 2 lb 2 x 15 Sidleying Lt shoulder abduction 2 lb 2 x 15 Standing flexion to 90 deg, move to abduction and lower, perform reverse - bilateral at same time x 15 - focus on horizontal abduction to wall.    TODAY'S TREATMENT:                                                                                              DATE: 01/21/2023 Therapeutic Exercise:  UBE fwd/back 3 mins each way lvl 4.0  Sidelying Lt shoulder AROM abduction 1 lb x 20 Sidelying Lt ER shoulder 1 lb x12, x 15  Seated AROM bilateral to tolerance 2 x 15  Tband rows Blue  band x 20  Tband GH extension blue  band x 20  Seated blue band 2 x 15 lat pull down Standing ball circles outstretched arm in 90 deg flexion 30 x 2 cw, ccw each Lt arm   Supine ER wand stretch in 15 sec hold  x 10     PATIENT EDUCATION: 01/14/2023 Education details: HEP update Person educated: Patient Education method: Programmer, multimedia, Demonstration, Verbal cues, and Handouts Education comprehension: verbalized understanding, returned demonstration  HOME EXERCISE PROGRAM: Access Code: [facility] URL: [email or web address] Date: 01/28/2023 Prepared by: Chyrel Masson  Exercises - Seated Scapular Retraction  - 2-3 x daily - 7 x weekly - 1 sets - 10 reps - Supine Shoulder Flexion Extension AAROM with Dowel  - 1-2 x daily - 7 x weekly -  1-2 sets - 10-15 reps - 3 hold - Supine Shoulder Flexion Extension Full Range AROM (Mirrored)  - 1-2 x daily - 7 x weekly - 2-3 sets - 10-15 reps - Supine Scapular Protraction in Flexion with Dumbbells  - 1-2 x daily - 7 x weekly - 1 sets - 10 reps - 3 hold - Sidelying Shoulder Abduction Palm Forward (Mirrored)  - 1-2 x  daily - 7 x weekly - 2-3 sets - 10-15 reps - Sidelying Shoulder External Rotation  - 1-2 x daily - 7 x weekly - 2-3 sets - 10-15 reps - Sleeper Stretch  - 2 x daily - 7 x weekly - 1 sets - 5 reps - 30 hold - Single Arm Doorway Pec Stretch at 90 Degrees Abduction (Mirrored)  - 2-3 x daily - 7 x weekly - 1 sets - 5 reps - 30 hold  ASSESSMENT:  CLINICAL IMPRESSION: Continued progression in strengthening c good tolerance.  ER mobility still limited most of all with some slow but steady improvement.   OBJECTIVE IMPAIRMENTS: decreased activity tolerance, decreased mobility, decreased ROM, decreased strength, hypomobility, increased edema, impaired flexibility, impaired UE functional use, postural dysfunction, and pain.   ACTIVITY LIMITATIONS: carrying, lifting, sitting, sleeping, bathing, dressing, reach over head, and hygiene/grooming  PARTICIPATION LIMITATIONS: meal prep, cleaning, laundry, driving, community activity, and occupation  PERSONAL FACTORS: 3+ comorbidities: HTN, pulmonary nodules, hx palpitations  are also affecting patient's functional outcome.   REHAB POTENTIAL: Good  CLINICAL DECISION MAKING: Stable/uncomplicated  EVALUATION COMPLEXITY: Low   GOALS: Goals reviewed with patient? No  SHORT TERM GOALS: Target date: 01/31/2023    Pt will demonstrate appropriate understanding and performance of initially prescribed HEP in order to facilitate improved independence with management of symptoms.  Baseline: HEP provided on eval Goal status: Met 01/23/2023  2. Pt will score greater than or equal to 51% on FOTO in order to demonstrate improved perception of function due to symptoms.  Baseline: 38%  Goal status: Met 01/28/2023  LONG TERM GOALS: Target date: 03/07/2023 Pt will score 66% on FOTO in order to demonstrate improved perception of function due to symptoms. Baseline: 38% Goal status: on going 01/02/2023  2.  Pt will demonstrate at least 150 degrees of L active shoulder  elevation in order to demonstrate improved tolerance to functional movement patterns such as upper body dressing and reaching overhead.  Baseline: see ROM chart above - AROM NT on eval given recency of surgery Goal status: on going 01/02/2023  3.  Pt will demonstrate at least 4+/5 shoulder flexion/abduction MMT for improved symmetry of UE strength and improved tolerance to functional movements.  Baseline: see MMT chart above - deferred on eval given recency of surgery Goal status: on going 01/02/2023  4. Pt will report/demonstrate ability to lift up to 10 lbs with less than 2 point increase in pain on NPS in order to indicative improved tolerance/independence with heavier activities around the home.   Baseline: NT given recency of surgery  Goal status: on going 01/02/2023   PLAN:  PT FREQUENCY: 2x/week  PT DURATION: 10 weeks  PLANNED INTERVENTIONS: Therapeutic exercises, Therapeutic activity, Neuromuscular re-education, Balance training, Gait training, Patient/Family education, Self Care, Joint mobilization, Dry Needling, Electrical stimulation, Spinal mobilization, Cryotherapy, Moist heat, scar mobilization, Taping, Vasopneumatic device, Manual therapy, and Re-evaluation; above unless contraindicated  PLAN FOR NEXT SESSION:   up to 10 lbs starting Monday for at home activity (remind Pt.)  Continued progressive strengthening.    Chyrel Masson, PT, DPT,  OCS, ATC 01/31/23  4:30 PM

## 2023-02-05 ENCOUNTER — Ambulatory Visit (INDEPENDENT_AMBULATORY_CARE_PROVIDER_SITE_OTHER): Payer: BC Managed Care – PPO | Admitting: Rehabilitative and Restorative Service Providers"

## 2023-02-05 ENCOUNTER — Encounter: Payer: Self-pay | Admitting: Rehabilitative and Restorative Service Providers"

## 2023-02-05 DIAGNOSIS — M25512 Pain in left shoulder: Secondary | ICD-10-CM | POA: Diagnosis not present

## 2023-02-05 DIAGNOSIS — M25612 Stiffness of left shoulder, not elsewhere classified: Secondary | ICD-10-CM | POA: Diagnosis not present

## 2023-02-05 DIAGNOSIS — R293 Abnormal posture: Secondary | ICD-10-CM | POA: Diagnosis not present

## 2023-02-05 DIAGNOSIS — M6281 Muscle weakness (generalized): Secondary | ICD-10-CM | POA: Diagnosis not present

## 2023-02-05 NOTE — Therapy (Signed)
OUTPATIENT PHYSICAL THERAPY TREATMENT   Patient Name: KHADIR ROAM MRN: [id number] DOB:1961-10-24, 62 y.o., male Today's Date: 02/05/2023  END OF SESSION:  PT End of Session - 02/05/23 1008     Visit Number 11    Number of Visits 21    Date for PT Re-Evaluation 02/21/23    Authorization Type BCBS    PT Start Time 1008    PT Stop Time 1047    PT Time Calculation (min) 39 min    Activity Tolerance Patient tolerated treatment well    Behavior During Therapy [facility] for tasks assessed/performed                 Past Medical History:  Diagnosis Date   Arthritis    Elevated coronary artery calcium score 07/2022   96th %'tile   GERD (gastroesophageal reflux disease)    Hypercholesterolemia 08/2020   TLC->imp 12/2020   Hypertension    Obesity, Class I, BMI 30-34.9    Palpitation 04/2020   Zio patch x 3d -->occ PAC and PVC; triggered events correlate with times of PVCs.  Reassured.   Pulmonary nodules    on coronary ca score CT 08/28/22-->f/u CT 3-6 mo (Dr. Einar Gip)   Past Surgical History:  Procedure Laterality Date   BICEPT TENODESIS Left 12/06/2022   Procedure: BICEPS TENODESIS;  Surgeon: Meredith Pel, MD;  Location: [place];  Service: Orthopedics;  Laterality: Left;   CHOLECYSTECTOMY Right 09/14/2021   Procedure: LAPAROSCOPIC CHOLECYSTECTOMY WITH INTRAOPERATIVE CHOLANGIOGRAM,;  Surgeon: Greer Pickerel, MD;  Location: [facility];  Service: General;  Laterality: Right;   COLONOSCOPY  approx 2018   ? polyp, unclear on recall time--get records.   coron calcium score     08/27/22: 96th%'tile (Dr. Einar Gip)   ESOPHAGEAL DILATION  2001   Dr. Amedeo Plenty.  Had it done twice (GERD-induced stricture)   Heart Rhythm monitoring  04/2020   Zio patch x 3d -->occ PAC and PVC; triggered events correlate with times of PVCs.  Reassured.   LEFT HEART CATH AND CORONARY ANGIOGRAPHY N/A 10/23/2022   Procedure: LEFT HEART CATH AND CORONARY ANGIOGRAPHY;  Surgeon: Adrian Prows, MD;  Location: [place] [facility];  Service: Cardiovascular;  Laterality: N/A;   SHOULDER ARTHROSCOPY Left 12/06/2022   Procedure: left shoulder arthroscopy, debridement, bursectomy;  Surgeon: Meredith Pel, MD;  Location: [place];  Service: Orthopedics;  Laterality: Left;   TRANSTHORACIC ECHOCARDIOGRAM  09/28/2022   normal   Patient Active Problem List   Diagnosis Date Noted   Biceps tendinitis of left shoulder 12/08/2022   Degenerative superior labral anterior-to-posterior (SLAP) tear of left shoulder 12/08/2022   Coronary artery disease of native artery of native heart with stable angina pectoris ([place])    Cholelithiasis 09/12/2021   Cholecystitis 09/12/2021   H/O adenomatous polyp of colon 04/05/2016   De Quervain's tenosynovitis, right 12/27/2014   Arthritis 12/06/2014   Essential hypertension 12/06/2014   GERD (gastroesophageal reflux disease) 12/06/2014   Hyperlipemia 12/06/2014   Prediabetes 12/06/2014    PCP: Tammi Sou, MD  REFERRING PROVIDER: Meredith Pel, MD  REFERRING DIAG: [phone] (ICD-10-CM) - Biceps tendinosis of left shoulder M25.512 (ICD-10-CM) - Left shoulder pain, unspecified chronicity M65.812 (ICD-10-CM) - Synovitis of left shoulder  THERAPY DIAG:  Left shoulder pain, unspecified chronicity  Stiffness of left shoulder, not elsewhere classified  Muscle weakness (generalized)  Abnormal posture  Rationale for Evaluation and Treatment: Rehabilitation  ONSET DATE: 12/06/22 Lt shoulder arthroscopy and biceps tenodesis  SUBJECTIVE:  SUBJECTIVE STATEMENT: He stated no complaints from the weekend.  He mentioned that superset activity was tough but not painful.     PERTINENT HISTORY: GERD, HTN, hx palpitations, pulmonary nodules  PAIN:   Location: Lt shoulder How would you  describe your pain? sore Best in past week: 0/10 Aggravating factors: movements, Easing factors: ice, tylenol   PRECAUTIONS: s/p Lt shoulder arthroscopic debridement with biceps tenodesis "Passive range of motion active assisted range of motion of the left shoulder with no biceps resistance exercises." Per 12/14/22 MD note  01/07/2023 MD note update:   No lifting with that left arm for the next 2 weeks. Weeks 2-4 from now he is okay to do lifting of 5 pounds. Weeks 4-6 from now he is okay to lift 10 pounds with the left arm.   WEIGHT BEARING RESTRICTIONS: No  FALLS:  Has patient fallen in last 6 months? No   LIVING ENVIRONMENT: 2 story home, main level livable. 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, desk work and up on feet  PLOF: Independent, get back to playing hockey  PATIENT GOALS: reduce pain, return to PLOF - get back to playing hockey  NEXT MD VISIT:   OBJECTIVE:   DIAGNOSTIC FINDINGS:  12/27/2022 12/06/22 shoulder arthroscopy with biceps tenodesis  PATIENT SURVEYS:  01/28/2023:  FOTO update 55  12/27/2022 FOTO 38%, 66 predicted  COGNITION: 12/27/2022 Overall cognitive status: Within functional limits for tasks assessed     SENSATION: 12/27/2022 Light touch grossly intact bilateral UE, pt denies any sensory issues  POSTURE: 12/27/2022 Slightly guarded posture Lt UE, tendency to hold in [facility] IR, forward head  UPPER EXTREMITY ROM:  A/PROM Right 12/27/2022 Left 12/27/2022 Left 01/10/2023 Left 01/17/2023 Left 01/28/2023  Shoulder flexion 167/NT NT/92s 120 AROM in supine 135 AROM in supine, AAROM with stick 139 145 AROM in supine  Shoulder abduction 170/NT NT/60s  90 AROM in supine 109 AROM in supine  Shoulder internal rotation     64 AROM in 45 deg abduction supine  Shoulder external rotation  NT/18 deg s   47 AROM in 45 deg abduction supine   Elbow flexion  Full elbow ROM painless but stiff sensation     Elbow extension       Wrist flexion       Wrist extension        (Blank rows = not tested) (Key: WFL = within functional limits not formally assessed, * = concordant pain, s = stiffness/stretching sensation, NT = not tested)  EVAL: Comments: AROM NT given recency of surgery, MD note recommending AAROM/PROM at present  UPPER EXTREMITY MMT:  MMT Right 01/21/2023 Left 01/21/2023  Shoulder flexion  3+/5  Shoulder extension    Shoulder abduction    Shoulder extension    Shoulder internal rotation    Shoulder external rotation    Elbow flexion    Elbow extension    Grip strength     (Blank rows = not tested)  EVAL: Comments: NT on eval given recency of surgery  SHOULDER SPECIAL TESTS: 12/27/2022 NT given recency of surgery  JOINT MOBILITY TESTING:  12/27/2022 NT  PALPATION:  12/27/2022 No overt TTP throughout shoulder girdle, mild muscular tightness UT/LS but asympomatic  OBSERVATION:  12/27/2022  Visually inspected surgical site w/ pt verbal consent. Appears WNL, healing well, no erythema or significant swelling. No drainage or excessive warmth   TODAY'S TREATMENT:  DATE: 02/05/2023  Therapeutic Exercise:  UBE fwd/back 5 mins forward, 4 mins backward lvl 4.0  with 15 sec interval at end of each minute.   Standing red pball wall slides flexion x 10  Standing red pball toss off wall overhead x 20  Standing 90 deg flexion, scaption red pball small circles 30 x 2 cw, ccw each.   Standing blue band ER c arm at side Lt 3 x 15 c fatigue hold in neutral at end of each set  Doorway ER stretch 30 seconds x 5 c elbow at side   Wall Push up plus 3 sec hold at top x 20   Standing flexion 90 to abduction to side and reverse 1 lb x 10 bilateral   TODAY'S TREATMENT:                                                                                               DATE: 01/31/2023 Manual:  Supine Lt shoulder inferior glides g4, mobilization c movement posterior glide with passive ER.  Contract relax for IR.   Therapeutic Exercise:  UBE fwd/back 4 mins each way lvl 4.0  Standing green band ER c arm at side Lt 3 x 15  Doorway ER stretch 30 seconds x 5 c elbow at side  Sitting flexion superset - x10 together, x 10 with one arm in 90 deg hold then switch for x10, then x 10 together - 1 lb  Sitting scaption superset - x10 together, x 10 with one arm in 90 deg hold then switch for x10, then x 10 together - 1 lb   TODAY'S TREATMENT:                                                                                              DATE: 01/23/2023 Manual:  Supine Lt shoulder inferior glides g4, mobilization c movement posterior glide with passive ER.  Contract relax for IR.   Therapeutic Exercise:  UBE fwd/back 4 mins each way lvl 4.0  Partial Lt sidelying sleeper stretch 30 sec x 3 c cues for home use   Doorway ER stretch 30 seconds x 5 c elbow at side  Sidelying Lt shoulder ER 2 lb 2 x 15 Sidleying Lt shoulder abduction 2 lb 2 x 15 Standing flexion to 90 deg, move to abduction and lower, perform reverse - bilateral at same time x 15 - focus on horizontal abduction to wall.      PATIENT EDUCATION: 01/14/2023 Education details: HEP update Person educated: Patient Education method: Programmer, multimedia, Demonstration, Verbal cues, and Handouts Education comprehension: verbalized understanding, returned demonstration  HOME EXERCISE PROGRAM: Access Code: [facility] URL: [email or web address] Date: 01/28/2023 Prepared by: Chyrel Masson  Exercises - Seated Scapular Retraction  - 2-3 x  daily - 7 x weekly - 1 sets - 10 reps - Supine Shoulder Flexion Extension AAROM with Dowel  - 1-2 x daily - 7 x weekly - 1-2 sets - 10-15 reps - 3 hold - Supine Shoulder Flexion Extension Full Range AROM (Mirrored)  - 1-2 x daily - 7 x weekly - 2-3  sets - 10-15 reps - Supine Scapular Protraction in Flexion with Dumbbells  - 1-2 x daily - 7 x weekly - 1 sets - 10 reps - 3 hold - Sidelying Shoulder Abduction Palm Forward (Mirrored)  - 1-2 x daily - 7 x weekly - 2-3 sets - 10-15 reps - Sidelying Shoulder External Rotation  - 1-2 x daily - 7 x weekly - 2-3 sets - 10-15 reps - Sleeper Stretch  - 2 x daily - 7 x weekly - 1 sets - 5 reps - 30 hold - Single Arm Doorway Pec Stretch at 90 Degrees Abduction (Mirrored)  - 2-3 x daily - 7 x weekly - 1 sets - 5 reps - 30 hold  ASSESSMENT:  CLINICAL IMPRESSION: Progress endurance activity in various positioning to help reduce fatigue with repetitive activity.  Encouraged continued stretching for rotation mobility gains.  Otherwise continued good gains at this time.   OBJECTIVE IMPAIRMENTS: decreased activity tolerance, decreased mobility, decreased ROM, decreased strength, hypomobility, increased edema, impaired flexibility, impaired UE functional use, postural dysfunction, and pain.   ACTIVITY LIMITATIONS: carrying, lifting, sitting, sleeping, bathing, dressing, reach over head, and hygiene/grooming  PARTICIPATION LIMITATIONS: meal prep, cleaning, laundry, driving, community activity, and occupation  PERSONAL FACTORS: 3+ comorbidities: HTN, pulmonary nodules, hx palpitations  are also affecting patient's functional outcome.   REHAB POTENTIAL: Good  CLINICAL DECISION MAKING: Stable/uncomplicated  EVALUATION COMPLEXITY: Low   GOALS: Goals reviewed with patient? No  SHORT TERM GOALS: Target date: 01/31/2023    Pt will demonstrate appropriate understanding and performance of initially prescribed HEP in order to facilitate improved independence with management of symptoms.  Baseline: HEP provided on eval Goal status: Met 01/23/2023  2. Pt will score greater than or equal to 51% on FOTO in order to demonstrate improved perception of function due to symptoms.  Baseline: 38%  Goal status: Met  01/28/2023  LONG TERM GOALS: Target date: 03/07/2023 Pt will score 66% on FOTO in order to demonstrate improved perception of function due to symptoms. Baseline: 38% Goal status: on going 01/02/2023  2.  Pt will demonstrate at least 150 degrees of L active shoulder elevation in order to demonstrate improved tolerance to functional movement patterns such as upper body dressing and reaching overhead.  Baseline: see ROM chart above - AROM NT on eval given recency of surgery Goal status: on going 01/02/2023  3.  Pt will demonstrate at least 4+/5 shoulder flexion/abduction MMT for improved symmetry of UE strength and improved tolerance to functional movements.  Baseline: see MMT chart above - deferred on eval given recency of surgery Goal status: on going 01/02/2023  4. Pt will report/demonstrate ability to lift up to 10 lbs with less than 2 point increase in pain on NPS in order to indicative improved tolerance/independence with heavier activities around the home.   Baseline: NT given recency of surgery  Goal status: on going 01/02/2023   PLAN:  PT FREQUENCY: 2x/week  PT DURATION: 10 weeks  PLANNED INTERVENTIONS: Therapeutic exercises, Therapeutic activity, Neuromuscular re-education, Balance training, Gait training, Patient/Family education, Self Care, Joint mobilization, Dry Needling, Electrical stimulation, Spinal mobilization, Cryotherapy, Moist heat, scar mobilization, Taping, Vasopneumatic device,  Manual therapy, and Re-evaluation; above unless contraindicated  PLAN FOR NEXT SESSION:   Recheck ROM measurements.    Scot Jun, PT, DPT, OCS, ATC 02/05/23  10:45 AM

## 2023-02-06 ENCOUNTER — Encounter: Payer: BC Managed Care – PPO | Admitting: Rehabilitative and Restorative Service Providers"

## 2023-02-07 ENCOUNTER — Ambulatory Visit (INDEPENDENT_AMBULATORY_CARE_PROVIDER_SITE_OTHER): Payer: BC Managed Care – PPO | Admitting: Rehabilitative and Restorative Service Providers"

## 2023-02-07 ENCOUNTER — Encounter: Payer: Self-pay | Admitting: Rehabilitative and Restorative Service Providers"

## 2023-02-07 DIAGNOSIS — R293 Abnormal posture: Secondary | ICD-10-CM | POA: Diagnosis not present

## 2023-02-07 DIAGNOSIS — M25512 Pain in left shoulder: Secondary | ICD-10-CM | POA: Diagnosis not present

## 2023-02-07 DIAGNOSIS — M25612 Stiffness of left shoulder, not elsewhere classified: Secondary | ICD-10-CM

## 2023-02-07 DIAGNOSIS — M6281 Muscle weakness (generalized): Secondary | ICD-10-CM | POA: Diagnosis not present

## 2023-02-07 NOTE — Therapy (Signed)
OUTPATIENT PHYSICAL THERAPY TREATMENT   Patient Name: Todd Bailey MRN: [id number] DOB:1961/06/03, 62 y.o., male Today's Date: 02/07/2023  END OF SESSION:  PT End of Session - 02/07/23 0807     Visit Number 12    Number of Visits 21    Date for PT Re-Evaluation 02/21/23    Authorization Type BC[employer]    PT Start Time 0758    PT Stop Time 0838    PT Time Calculation (min) 40 min    Activity Tolerance Patient tolerated treatment well    Behavior During Therapy [facility] for tasks assessed/performed                  Past Medical History:  Diagnosis Date   Arthritis    Elevated coronary artery calcium score 07/2022   96th %'tile   GERD (gastroesophageal reflux disease)    Hypercholesterolemia 08/2020   TLC->imp 12/2020   Hypertension    Obesity, Class I, BMI 30-34.9    Palpitation 04/2020   Zio patch x 3d -->occ PAC and PVC; triggered events correlate with times of PVCs.  Reassured.   Pulmonary nodules    on coronary ca score CT 08/28/22-->f/u CT 3-6 mo (Dr. Jacinto Halim)   Past Surgical History:  Procedure Laterality Date   BICEPT TENODESIS Left 12/06/2022   Procedure: BICEPS TENODESIS;  Surgeon: Cammy Copa, MD;  Location: [facility] OR;  Service: Orthopedics;  Laterality: Left;   CHOLECYSTECTOMY Right 09/14/2021   Procedure: LAPAROSCOPIC CHOLECYSTECTOMY WITH INTRAOPERATIVE CHOLANGIOGRAM,;  Surgeon: [name], MD;  Location: [facility] ORS;  Service: General;  Laterality: Right;   COLONOSCOPY  approx 2018   ? polyp, unclear on recall time--get records.   coron calcium score     08/27/22: 96th%'tile (Dr. Jacinto Halim)   ESOPHAGEAL DILATION  2001   Dr. Madilyn Fireman.  Had it done twice (GERD-induced stricture)   Heart Rhythm monitoring  04/2020   Zio patch x 3d -->occ PAC and PVC; triggered events correlate with times of PVCs.  Reassured.   LEFT HEART CATH AND CORONARY ANGIOGRAPHY N/A 10/23/2022   Procedure: LEFT HEART CATH AND CORONARY ANGIOGRAPHY;  Surgeon: Yates Decamp, MD;  Location: [facility]  INVASIVE CV LAB;  Service: Cardiovascular;  Laterality: N/A;   SHOULDER ARTHROSCOPY Left 12/06/2022   Procedure: left shoulder arthroscopy, debridement, bursectomy;  Surgeon: Cammy Copa, MD;  Location: [facility] OR;  Service: Orthopedics;  Laterality: Left;   TRANSTHORACIC ECHOCARDIOGRAM  09/28/2022   normal   Patient Active Problem List   Diagnosis Date Noted   Biceps tendinitis of left shoulder 12/08/2022   Degenerative superior labral anterior-to-posterior (SLAP) tear of left shoulder 12/08/2022   Coronary artery disease of native artery of native heart with stable angina pectoris (HCC)    Cholelithiasis 09/12/2021   Cholecystitis 09/12/2021   H/O adenomatous polyp of colon 04/05/2016   De Quervain's tenosynovitis, right 12/27/2014   Arthritis 12/06/2014   Essential hypertension 12/06/2014   GERD (gastroesophageal reflux disease) 12/06/2014   Hyperlipemia 12/06/2014   Prediabetes 12/06/2014    PCP: Jeoffrey Massed, MD  REFERRING PROVIDER: Cammy Copa, MD  REFERRING DIAG: [phone] (ICD-10-CM) - Biceps tendinosis of left shoulder M25.512 (ICD-10-CM) - Left shoulder pain, unspecified chronicity M65.812 (ICD-10-CM) - Synovitis of left shoulder  THERAPY DIAG:  Left shoulder pain, unspecified chronicity  Stiffness of left shoulder, not elsewhere classified  Muscle weakness (generalized)  Abnormal posture  Rationale for Evaluation and Treatment: Rehabilitation  ONSET DATE: 12/06/22 Lt shoulder arthroscopy and biceps tenodesis  SUBJECTIVE:  SUBJECTIVE STATEMENT: He indicated a "htich/catch" in lifting at times but not real pain    PERTINENT HISTORY: GERD, HTN, hx palpitations, pulmonary nodules  PAIN:   Location: Lt shoulder How would you describe your pain? sore Best in past  week: 0/10 Aggravating factors: movements, Easing factors: ice, tylenol   PRECAUTIONS: s/p Lt shoulder arthroscopic debridement with biceps tenodesis "Passive range of motion active assisted range of motion of the left shoulder with no biceps resistance exercises." Per 12/14/22 MD note  01/07/2023 MD note update:   No lifting with that left arm for the next 2 weeks. Weeks 2-4 from now he is okay to do lifting of 5 pounds. Weeks 4-6 from now he is okay to lift 10 pounds with the left arm.   WEIGHT BEARING RESTRICTIONS: No  FALLS:  Has patient fallen in last 6 months? No   LIVING ENVIRONMENT: 2 story home, main level livable. 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, desk work and up on feet  PLOF: Independent, get back to playing hockey  PATIENT GOALS: reduce pain, return to PLOF - get back to playing hockey  NEXT MD VISIT:   OBJECTIVE:   DIAGNOSTIC FINDINGS:  12/27/2022 12/06/22 shoulder arthroscopy with biceps tenodesis  PATIENT SURVEYS:  01/28/2023:  FOTO update 55  12/27/2022 FOTO 38%, 66 predicted  COGNITION: 12/27/2022 Overall cognitive status: Within functional limits for tasks assessed     SENSATION: 12/27/2022 Light touch grossly intact bilateral UE, pt denies any sensory issues  POSTURE: 12/27/2022 Slightly guarded posture Lt UE, tendency to hold in [facility] IR, forward head  UPPER EXTREMITY ROM:  A/PROM Right 12/27/2022 Left 12/27/2022 Left 01/10/2023 Left 01/17/2023 Left 01/28/2023 Left 02/07/2023 Measured in supine  Shoulder flexion 167/NT NT/92s 120 AROM in supine 135 AROM in supine, AAROM with stick 139 145 AROM in supine 145 AROM  Shoulder abduction 170/NT NT/60s  90 AROM in supine 109 AROM in supine 125 AROM   Shoulder internal rotation     64 AROM in 45 deg abduction supine 70 AROM in 45 deg abduction supine  Shoulder external rotation   NT/18 deg s   47 AROM in 45 deg abduction supine 58 AROM in 45 deg abduction supine  Elbow flexion  Full elbow ROM painless but stiff sensation      Elbow extension        Wrist flexion        Wrist extension         (Blank rows = not tested) (Key: WFL = within functional limits not formally assessed, * = concordant pain, s = stiffness/stretching sensation, NT = not tested)  EVAL: Comments: AROM NT given recency of surgery, MD note recommending AAROM/PROM at present  UPPER EXTREMITY MMT:  MMT Right 01/21/2023 Left 01/21/2023 Left 02/07/2023  Shoulder flexion  3+/5 5/5  Shoulder extension     Shoulder abduction   4/5  Shoulder extension     Shoulder internal rotation   5/5  Shoulder external rotation   4/5  Elbow flexion     Elbow extension     Grip strength      (Blank rows = not tested)  EVAL: Comments: NT on eval given recency of surgery  SHOULDER SPECIAL TESTS: 12/27/2022 NT given recency of surgery  JOINT MOBILITY TESTING:  12/27/2022 NT  PALPATION:  12/27/2022 No overt TTP throughout shoulder girdle, mild muscular tightness UT/LS but  asympomatic  OBSERVATION:  12/27/2022  Visually inspected surgical site w/ pt verbal consent. Appears WNL, healing well, no erythema or significant swelling. No drainage or excessive warmth   TODAY'S TREATMENT:                                                                                              DATE: 02/07/2023  Therapeutic Exercise:  Pulley flexion, scaption 3 mins each direction   Standing wall flexion c lift off x 10   Standing wand AAROM abduction back against wall 5 sec hold x 10   Standing green band ER c flexion punch to 90 deg Lt arm 2 x 10   Wall Push up plus 3 sec hold at top x 20   Machine rows 2 x 15 20 lbs  Machine lat pull down 2 x 15 20 lbs   Manual  Supine Lt shoulder posterior glide mobilization c movement with active ER.  Inferior glides in abduction end range g3-g4 Lt shoulder  TODAY'S TREATMENT:                                                                                               DATE: 02/05/2023  Therapeutic Exercise:  UBE fwd/back 5 mins forward, 4 mins backward lvl 4.0  with 15 sec interval at end of each minute.   Standing red pball wall slides flexion x 10  Standing red pball toss off wall overhead x 20  Standing 90 deg flexion, scaption red pball small circles 30 x 2 cw, ccw each.   Standing blue band ER c arm at side Lt 3 x 15 c fatigue hold in neutral at end of each set  Doorway ER stretch 30 seconds x 5 c elbow at side   Wall Push up plus 3 sec hold at top x 20   Standing flexion 90 to abduction to side and reverse 1 lb x 10 bilateral   TODAY'S TREATMENT:                                                                                              DATE: 01/31/2023 Manual:  Supine Lt shoulder inferior glides g4, mobilization c movement posterior glide with passive ER.  Contract relax for IR.   Therapeutic Exercise:  UBE fwd/back 4 mins each way lvl 4.0  Standing green band ER c arm  at side Lt 3 x 15  Doorway ER stretch 30 seconds x 5 c elbow at side  Sitting flexion superset - x10 together, x 10 with one arm in 90 deg hold then switch for x10, then x 10 together - 1 lb  Sitting scaption superset - x10 together, x 10 with one arm in 90 deg hold then switch for x10, then x 10 together - 1 lb   TODAY'S TREATMENT:                                                                                              DATE: 01/23/2023 Manual:  Supine Lt shoulder inferior glides g4, mobilization c movement posterior glide with passive ER.  Contract relax for IR.   Therapeutic Exercise:  UBE fwd/back 4 mins each way lvl 4.0  Partial Lt sidelying sleeper stretch 30 sec x 3 c cues for home use   Doorway ER stretch 30 seconds x 5 c elbow at side  Sidelying Lt shoulder ER 2 lb 2 x 15 Sidleying Lt shoulder abduction 2 lb 2 x 15 Standing flexion to 90 deg, move to abduction and lower, perform  reverse - bilateral at same time x 15 - focus on horizontal abduction to wall.      PATIENT EDUCATION: 01/14/2023 Education details: HEP update Person educated: Patient Education method: Programmer, multimedia, Demonstration, Verbal cues, and Handouts Education comprehension: verbalized understanding, returned demonstration  HOME EXERCISE PROGRAM: Access Code: [facility] URL: [email or web address] Date: 01/28/2023 Prepared by: Chyrel Masson  Exercises - Seated Scapular Retraction  - 2-3 x daily - 7 x weekly - 1 sets - 10 reps - Supine Shoulder Flexion Extension AAROM with Dowel  - 1-2 x daily - 7 x weekly - 1-2 sets - 10-15 reps - 3 hold - Supine Shoulder Flexion Extension Full Range AROM (Mirrored)  - 1-2 x daily - 7 x weekly - 2-3 sets - 10-15 reps - Supine Scapular Protraction in Flexion with Dumbbells  - 1-2 x daily - 7 x weekly - 1 sets - 10 reps - 3 hold - Sidelying Shoulder Abduction Palm Forward (Mirrored)  - 1-2 x daily - 7 x weekly - 2-3 sets - 10-15 reps - Sidelying Shoulder External Rotation  - 1-2 x daily - 7 x weekly - 2-3 sets - 10-15 reps - Sleeper Stretch  - 2 x daily - 7 x weekly - 1 sets - 5 reps - 30 hold - Single Arm Doorway Pec Stretch at 90 Degrees Abduction (Mirrored)  - 2-3 x daily - 7 x weekly - 1 sets - 5 reps - 30 hold  ASSESSMENT:  CLINICAL IMPRESSION: Mobility continued to improve with impairment in ER/abduction most noted still.  Strength testing showed good changes as well.  Continued intervention for end range mobility gains and functional strengthening warranted at this time.   OBJECTIVE IMPAIRMENTS: decreased activity tolerance, decreased mobility, decreased ROM, decreased strength, hypomobility, increased edema, impaired flexibility, impaired UE functional use, postural dysfunction, and pain.   ACTIVITY LIMITATIONS: carrying, lifting, sitting, sleeping, bathing, dressing, reach over head, and hygiene/grooming  PARTICIPATION LIMITATIONS: meal  prep, cleaning, laundry,  driving, community activity, and occupation  PERSONAL FACTORS: 3+ comorbidities: HTN, pulmonary nodules, hx palpitations  are also affecting patient's functional outcome.   REHAB POTENTIAL: Good  CLINICAL DECISION MAKING: Stable/uncomplicated  EVALUATION COMPLEXITY: Low   GOALS: Goals reviewed with patient? No  SHORT TERM GOALS: Target date: 01/31/2023    Pt will demonstrate appropriate understanding and performance of initially prescribed HEP in order to facilitate improved independence with management of symptoms.  Baseline: HEP provided on eval Goal status: Met 01/23/2023  2. Pt will score greater than or equal to 51% on FOTO in order to demonstrate improved perception of function due to symptoms.  Baseline: 38%  Goal status: Met 01/28/2023  LONG TERM GOALS: Target date: 03/07/2023 Pt will score 66% on FOTO in order to demonstrate improved perception of function due to symptoms. Baseline: 38% Goal status: on going 02/07/2023  2.  Pt will demonstrate at least 150 degrees of L active shoulder elevation in order to demonstrate improved tolerance to functional movement patterns such as upper body dressing and reaching overhead.  Baseline: see ROM chart above - AROM NT on eval given recency of surgery Goal status: on going 02/07/2023  3.  Pt will demonstrate at least 4+/5 shoulder flexion/abduction MMT for improved symmetry of UE strength and improved tolerance to functional movements.  Baseline: see MMT chart above - deferred on eval given recency of surgery Goal status: on going 02/07/2023  4. Pt will report/demonstrate ability to lift up to 10 lbs with less than 2 point increase in pain on NPS in order to indicative improved tolerance/independence with heavier activities around the home.   Baseline: NT given recency of surgery  Goal status: on going 02/07/2023   PLAN:  PT FREQUENCY: 2x/week  PT DURATION: 10 weeks  PLANNED INTERVENTIONS: Therapeutic  exercises, Therapeutic activity, Neuromuscular re-education, Balance training, Gait training, Patient/Family education, Self Care, Joint mobilization, Dry Needling, Electrical stimulation, Spinal mobilization, Cryotherapy, Moist heat, scar mobilization, Taping, Vasopneumatic device, Manual therapy, and Re-evaluation; above unless contraindicated  PLAN FOR NEXT SESSION:   Manual for mobility improvements as needed.  Progressive strengthening.    Scot Jun, PT, DPT, OCS, ATC 02/07/23  8:35 AM

## 2023-02-12 ENCOUNTER — Ambulatory Visit (INDEPENDENT_AMBULATORY_CARE_PROVIDER_SITE_OTHER): Payer: BC Managed Care – PPO | Admitting: Rehabilitative and Restorative Service Providers"

## 2023-02-12 ENCOUNTER — Encounter: Payer: Self-pay | Admitting: Rehabilitative and Restorative Service Providers"

## 2023-02-12 DIAGNOSIS — M6281 Muscle weakness (generalized): Secondary | ICD-10-CM

## 2023-02-12 DIAGNOSIS — R293 Abnormal posture: Secondary | ICD-10-CM | POA: Diagnosis not present

## 2023-02-12 DIAGNOSIS — M25612 Stiffness of left shoulder, not elsewhere classified: Secondary | ICD-10-CM

## 2023-02-12 DIAGNOSIS — M25512 Pain in left shoulder: Secondary | ICD-10-CM | POA: Diagnosis not present

## 2023-02-12 NOTE — Therapy (Signed)
OUTPATIENT PHYSICAL THERAPY TREATMENT   Patient Name: Todd Bailey MRN: [id number] DOB:03-24-1961, 62 y.o., male Today's Date: 02/12/2023  END OF SESSION:  PT End of Session - 02/12/23 1422     Visit Number 13    Number of Visits 21    Date for PT Re-Evaluation 02/21/23    Authorization Type BC[employer]    PT Start Time 1418    PT Stop Time 1457    PT Time Calculation (min) 39 min    Activity Tolerance Patient tolerated treatment well    Behavior During Therapy [facility] for tasks assessed/performed                   Past Medical History:  Diagnosis Date   Arthritis    Elevated coronary artery calcium score 07/2022   96th %'tile   GERD (gastroesophageal reflux disease)    Hypercholesterolemia 08/2020   TLC->imp 12/2020   Hypertension    Obesity, Class I, BMI 30-34.9    Palpitation 04/2020   Zio patch x 3d -->occ PAC and PVC; triggered events correlate with times of PVCs.  Reassured.   Pulmonary nodules    on coronary ca score CT 08/28/22-->f/u CT 3-6 mo (Dr. Einar Gip)   Past Surgical History:  Procedure Laterality Date   BICEPT TENODESIS Left 12/06/2022   Procedure: BICEPS TENODESIS;  Surgeon: Meredith Pel, MD;  Location: [place];  Service: Orthopedics;  Laterality: Left;   CHOLECYSTECTOMY Right 09/14/2021   Procedure: LAPAROSCOPIC CHOLECYSTECTOMY WITH INTRAOPERATIVE CHOLANGIOGRAM,;  Surgeon: Greer Pickerel, MD;  Location: [facility];  Service: General;  Laterality: Right;   COLONOSCOPY  approx 2018   ? polyp, unclear on recall time--get records.   coron calcium score     08/27/22: 96th%'tile (Dr. Einar Gip)   ESOPHAGEAL DILATION  2001   Dr. Amedeo Plenty.  Had it done twice (GERD-induced stricture)   Heart Rhythm monitoring  04/2020   Zio patch x 3d -->occ PAC and PVC; triggered events correlate with times of PVCs.  Reassured.   LEFT HEART CATH AND CORONARY ANGIOGRAPHY N/A 10/23/2022   Procedure: LEFT HEART CATH AND CORONARY ANGIOGRAPHY;  Surgeon: Adrian Prows, MD;  Location: [place] [facility];  Service: Cardiovascular;  Laterality: N/A;   SHOULDER ARTHROSCOPY Left 12/06/2022   Procedure: left shoulder arthroscopy, debridement, bursectomy;  Surgeon: Meredith Pel, MD;  Location: [place];  Service: Orthopedics;  Laterality: Left;   TRANSTHORACIC ECHOCARDIOGRAM  09/28/2022   normal   Patient Active Problem List   Diagnosis Date Noted   Biceps tendinitis of left shoulder 12/08/2022   Degenerative superior labral anterior-to-posterior (SLAP) tear of left shoulder 12/08/2022   Coronary artery disease of native artery of native heart with stable angina pectoris ([place])    Cholelithiasis 09/12/2021   Cholecystitis 09/12/2021   H/O adenomatous polyp of colon 04/05/2016   De Quervain's tenosynovitis, right 12/27/2014   Arthritis 12/06/2014   Essential hypertension 12/06/2014   GERD (gastroesophageal reflux disease) 12/06/2014   Hyperlipemia 12/06/2014   Prediabetes 12/06/2014    PCP: Tammi Sou, MD  REFERRING PROVIDER: Meredith Pel, MD  REFERRING DIAG: [phone] (ICD-10-CM) - Biceps tendinosis of left shoulder M25.512 (ICD-10-CM) - Left shoulder pain, unspecified chronicity M65.812 (ICD-10-CM) - Synovitis of left shoulder  THERAPY DIAG:  Left shoulder pain, unspecified chronicity  Stiffness of left shoulder, not elsewhere classified  Muscle weakness (generalized)  Abnormal posture  Rationale for Evaluation and Treatment: Rehabilitation  ONSET DATE: 12/06/22 Lt shoulder arthroscopy and biceps tenodesis  SUBJECTIVE:  SUBJECTIVE STATEMENT: He indicated he was sore from outside yard work activity.    PERTINENT HISTORY: GERD, HTN, hx palpitations, pulmonary nodules  PAIN:   Location: Lt shoulder How would you describe your pain? sore Best in past week:  0/10 Aggravating factors: movements, Easing factors: ice, tylenol   PRECAUTIONS: s/p Lt shoulder arthroscopic debridement with biceps tenodesis "Passive range of motion active assisted range of motion of the left shoulder with no biceps resistance exercises." Per 12/14/22 MD note  01/07/2023 MD note update:   No lifting with that left arm for the next 2 weeks. Weeks 2-4 from now he is okay to do lifting of 5 pounds. Weeks 4-6 from now he is okay to lift 10 pounds with the left arm.   WEIGHT BEARING RESTRICTIONS: No  FALLS:  Has patient fallen in last 6 months? No   LIVING ENVIRONMENT: 2 story home, main level livable. 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, desk work and up on feet  PLOF: Independent, get back to playing hockey  PATIENT GOALS: reduce pain, return to PLOF - get back to playing hockey  NEXT MD VISIT:   OBJECTIVE:   DIAGNOSTIC FINDINGS:  12/27/2022 12/06/22 shoulder arthroscopy with biceps tenodesis  PATIENT SURVEYS:  01/28/2023:  FOTO update 55  12/27/2022 FOTO 38%, 66 predicted  COGNITION: 12/27/2022 Overall cognitive status: Within functional limits for tasks assessed     SENSATION: 12/27/2022 Light touch grossly intact bilateral UE, pt denies any sensory issues  POSTURE: 12/27/2022 Slightly guarded posture Lt UE, tendency to hold in [facility] IR, forward head  UPPER EXTREMITY ROM:  A/PROM Right 12/27/2022 Left 12/27/2022 Left 01/10/2023 Left 01/17/2023 Left 01/28/2023 Left 02/07/2023 Measured in supine  Shoulder flexion 167/NT NT/92s 120 AROM in supine 135 AROM in supine, AAROM with stick 139 145 AROM in supine 145 AROM  Shoulder abduction 170/NT NT/60s  90 AROM in supine 109 AROM in supine 125 AROM   Shoulder internal rotation     64 AROM in 45 deg abduction supine 70 AROM in 45 deg abduction supine  Shoulder external rotation  NT/18  deg s   47 AROM in 45 deg abduction supine 58 AROM in 45 deg abduction supine  Elbow flexion  Full elbow ROM painless but stiff sensation      Elbow extension        Wrist flexion        Wrist extension         (Blank rows = not tested) (Key: WFL = within functional limits not formally assessed, * = concordant pain, s = stiffness/stretching sensation, NT = not tested)  EVAL: Comments: AROM NT given recency of surgery, MD note recommending AAROM/PROM at present  UPPER EXTREMITY MMT:  MMT Right 01/21/2023 Left 01/21/2023 Left 02/07/2023  Shoulder flexion  3+/5 5/5  Shoulder extension     Shoulder abduction   4/5  Shoulder extension     Shoulder internal rotation   5/5  Shoulder external rotation   4/5  Elbow flexion     Elbow extension     Grip strength      (Blank rows = not tested)  EVAL: Comments: NT on eval given recency of surgery  SHOULDER SPECIAL TESTS: 12/27/2022 NT given recency of surgery  JOINT MOBILITY TESTING:  12/27/2022 NT  PALPATION:  12/27/2022 No overt TTP throughout shoulder girdle, mild muscular tightness UT/LS but asympomatic  OBSERVATION:  12/27/2022  Visually inspected surgical site w/ pt verbal consent. Appears WNL, healing well, no erythema or significant swelling. No drainage or excessive warmth   TODAY'S TREATMENT:                                                                                              DATE: 02/12/2023 Therapeutic Exercise:  UBE fwd/back 4 mins each way c 10 sec intervals faster end of each minute lvl 4.0  Prone horizontal abduction 2 x 10   Prone scaption 2 x 10  Prone shoulder extension 2 x 15 2 lb  Pink pball wall flexion walk ups x 10  Pink pball overshoulder bounces   Standing blue band eccentric only Lt shoulder ER c towel under arm 2 x 15 (2-3 sec)  Chest press machine serratus Lt arm only 10 lbs 5 sec hold x 10   Manual  Supine Lt shoulder posterior glide mobilization c movement with passive  ER, then IR   Inferior glides in abduction end range g3-g4 Lt shoulder  TODAY'S TREATMENT:                                                                                              DATE: 02/07/2023  Therapeutic Exercise:  Pulley flexion, scaption 3 mins each direction   Standing wall flexion c lift off x 10   Standing wand AAROM abduction back against wall 5 sec hold x 10   Standing green band ER c flexion punch to 90 deg Lt arm 2 x 10   Wall Push up plus 3 sec hold at top x 20   Machine rows 2 x 15 20 lbs  Machine lat pull down 2 x 15 20 lbs   Manual  Supine Lt shoulder posterior glide mobilization c movement with active ER.  Inferior glides in abduction end range g3-g4 Lt shoulder  TODAY'S TREATMENT:                                                                                              DATE: 02/05/2023  Therapeutic Exercise:  UBE fwd/back 5 mins forward, 4 mins backward lvl 4.0  with 15 sec interval at end of each minute.   Standing red pball wall slides flexion x 10  Standing red pball toss off wall overhead x 20  Standing 90 deg flexion, scaption  red pball small circles 30 x 2 cw, ccw each.   Standing blue band ER c arm at side Lt 3 x 15 c fatigue hold in neutral at end of each set  Doorway ER stretch 30 seconds x 5 c elbow at side   Wall Push up plus 3 sec hold at top x 20   Standing flexion 90 to abduction to side and reverse 1 lb x 10 bilateral   PATIENT EDUCATION: 01/14/2023 Education details: HEP update Person educated: Patient Education method: Consulting civil engineer, Demonstration, Verbal cues, and Handouts Education comprehension: verbalized understanding, returned demonstration  HOME EXERCISE PROGRAM: Access Code: [facility] URL: [email or web address] Date: 01/28/2023 Prepared by: Scot Jun  Exercises - Seated Scapular Retraction  - 2-3 x daily - 7 x weekly - 1 sets - 10 reps - Supine Shoulder Flexion Extension AAROM with Dowel  - 1-2 x daily - 7 x weekly - 1-2  sets - 10-15 reps - 3 hold - Supine Shoulder Flexion Extension Full Range AROM (Mirrored)  - 1-2 x daily - 7 x weekly - 2-3 sets - 10-15 reps - Supine Scapular Protraction in Flexion with Dumbbells  - 1-2 x daily - 7 x weekly - 1 sets - 10 reps - 3 hold - Sidelying Shoulder Abduction Palm Forward (Mirrored)  - 1-2 x daily - 7 x weekly - 2-3 sets - 10-15 reps - Sidelying Shoulder External Rotation  - 1-2 x daily - 7 x weekly - 2-3 sets - 10-15 reps - Sleeper Stretch  - 2 x daily - 7 x weekly - 1 sets - 5 reps - 30 hold - Single Arm Doorway Pec Stretch at 90 Degrees Abduction (Mirrored)  - 2-3 x daily - 7 x weekly - 1 sets - 5 reps - 30 hold  ASSESSMENT:  CLINICAL IMPRESSION: Continued inclusion of manual to progress end range mobility but showing steady improvements.  Inclusion of prone based scapular strengthening to improve scapulothoracic rhythm. Held from HEP at this time but can include when appropriate.  Continued skilled PT services indicated.    OBJECTIVE IMPAIRMENTS: decreased activity tolerance, decreased mobility, decreased ROM, decreased strength, hypomobility, increased edema, impaired flexibility, impaired UE functional use, postural dysfunction, and pain.   ACTIVITY LIMITATIONS: carrying, lifting, sitting, sleeping, bathing, dressing, reach over head, and hygiene/grooming  PARTICIPATION LIMITATIONS: meal prep, cleaning, laundry, driving, community activity, and occupation  PERSONAL FACTORS: 3+ comorbidities: HTN, pulmonary nodules, hx palpitations  are also affecting patient's functional outcome.   REHAB POTENTIAL: Good  CLINICAL DECISION MAKING: Stable/uncomplicated  EVALUATION COMPLEXITY: Low   GOALS: Goals reviewed with patient? No  SHORT TERM GOALS: Target date: 01/31/2023    Pt will demonstrate appropriate understanding and performance of initially prescribed HEP in order to facilitate improved independence with management of symptoms.  Baseline: HEP provided on  eval Goal status: Met 01/23/2023  2. Pt will score greater than or equal to 51% on FOTO in order to demonstrate improved perception of function due to symptoms.  Baseline: 38%  Goal status: Met 01/28/2023  LONG TERM GOALS: Target date: 03/07/2023 Pt will score 66% on FOTO in order to demonstrate improved perception of function due to symptoms. Baseline: 38% Goal status: on going 02/07/2023  2.  Pt will demonstrate at least 150 degrees of L active shoulder elevation in order to demonstrate improved tolerance to functional movement patterns such as upper body dressing and reaching overhead.  Baseline: see ROM chart above - AROM NT on eval given recency of surgery Goal status:  on going 02/07/2023  3.  Pt will demonstrate at least 4+/5 shoulder flexion/abduction MMT for improved symmetry of UE strength and improved tolerance to functional movements.  Baseline: see MMT chart above - deferred on eval given recency of surgery Goal status: on going 02/07/2023  4. Pt will report/demonstrate ability to lift up to 10 lbs with less than 2 point increase in pain on NPS in order to indicative improved tolerance/independence with heavier activities around the home.   Baseline: NT given recency of surgery  Goal status: on going 02/07/2023   PLAN:  PT FREQUENCY: 2x/week  PT DURATION: 10 weeks  PLANNED INTERVENTIONS: Therapeutic exercises, Therapeutic activity, Neuromuscular re-education, Balance training, Gait training, Patient/Family education, Self Care, Joint mobilization, Dry Needling, Electrical stimulation, Spinal mobilization, Cryotherapy, Moist heat, scar mobilization, Taping, Vasopneumatic device, Manual therapy, and Re-evaluation; above unless contraindicated  PLAN FOR NEXT SESSION:   Adjust HEP to include prone activity possible.    Scot Jun, PT, DPT, OCS, ATC 02/12/23  2:56 PM

## 2023-02-15 ENCOUNTER — Encounter: Payer: Self-pay | Admitting: Rehabilitative and Restorative Service Providers"

## 2023-02-15 ENCOUNTER — Ambulatory Visit (INDEPENDENT_AMBULATORY_CARE_PROVIDER_SITE_OTHER): Payer: BC Managed Care – PPO | Admitting: Rehabilitative and Restorative Service Providers"

## 2023-02-15 DIAGNOSIS — R293 Abnormal posture: Secondary | ICD-10-CM

## 2023-02-15 DIAGNOSIS — M6281 Muscle weakness (generalized): Secondary | ICD-10-CM

## 2023-02-15 DIAGNOSIS — M25512 Pain in left shoulder: Secondary | ICD-10-CM | POA: Diagnosis not present

## 2023-02-15 DIAGNOSIS — M25612 Stiffness of left shoulder, not elsewhere classified: Secondary | ICD-10-CM | POA: Diagnosis not present

## 2023-02-15 NOTE — Therapy (Signed)
OUTPATIENT PHYSICAL THERAPY TREATMENT   Patient Name: Todd Bailey MRN: [id number] DOB:[birth year], 62 y.o., male Today's Date: 02/15/2023  END OF SESSION:  PT End of Session - 02/15/23 0759     Visit Number 14    Number of Visits 21    Date for PT Re-Evaluation 02/21/23    Authorization Type [employer]    PT Start Time 0754    PT Stop Time 0833    PT Time Calculation (min) 39 min    Activity Tolerance Patient tolerated treatment well    Behavior During Therapy [facility] for tasks assessed/performed                    Past Medical History:  Diagnosis Date   Arthritis    Elevated coronary artery calcium score 07/2022   96th %'tile   GERD (gastroesophageal reflux disease)    Hypercholesterolemia 08/2020   TLC->imp 12/2020   Hypertension    Obesity, Class I, BMI 30-34.9    Palpitation 04/2020   Zio patch x 3d -->occ PAC and PVC; triggered events correlate with times of PVCs.  Reassured.   Pulmonary nodules    on coronary ca score CT 08/28/22-->f/u CT 3-6 mo (Dr. Einar Gip)   Past Surgical History:  Procedure Laterality Date   BICEPT TENODESIS Left 12/06/2022   Procedure: BICEPS TENODESIS;  Surgeon: Meredith Pel, MD;  Location: [place];  Service: Orthopedics;  Laterality: Left;   CHOLECYSTECTOMY Right 09/14/2021   Procedure: LAPAROSCOPIC CHOLECYSTECTOMY WITH INTRAOPERATIVE CHOLANGIOGRAM,;  Surgeon: Greer Pickerel, MD;  Location: [facility];  Service: General;  Laterality: Right;   COLONOSCOPY  approx 2018   ? polyp, unclear on recall time--get records.   coron calcium score     08/27/22: 96th%'tile (Dr. Einar Gip)   ESOPHAGEAL DILATION  2001   Dr. Amedeo Plenty.  Had it done twice (GERD-induced stricture)   Heart Rhythm monitoring  04/2020   Zio patch x 3d -->occ PAC and PVC; triggered events correlate with times of PVCs.  Reassured.   LEFT HEART CATH AND CORONARY ANGIOGRAPHY N/A 10/23/2022   Procedure: LEFT HEART CATH AND CORONARY ANGIOGRAPHY;  Surgeon: Adrian Prows, MD;  Location: [place] [facility];  Service: Cardiovascular;  Laterality: N/A;   SHOULDER ARTHROSCOPY Left 12/06/2022   Procedure: left shoulder arthroscopy, debridement, bursectomy;  Surgeon: Meredith Pel, MD;  Location: [place];  Service: Orthopedics;  Laterality: Left;   TRANSTHORACIC ECHOCARDIOGRAM  09/28/2022   normal   Patient Active Problem List   Diagnosis Date Noted   Biceps tendinitis of left shoulder 12/08/2022   Degenerative superior labral anterior-to-posterior (SLAP) tear of left shoulder 12/08/2022   Coronary artery disease of native artery of native heart with stable angina pectoris ([place])    Cholelithiasis 09/12/2021   Cholecystitis 09/12/2021   H/O adenomatous polyp of colon 04/05/2016   De Quervain's tenosynovitis, right 12/27/2014   Arthritis 12/06/2014   Essential hypertension 12/06/2014   GERD (gastroesophageal reflux disease) 12/06/2014   Hyperlipemia 12/06/2014   Prediabetes 12/06/2014    PCP: Tammi Sou, MD  REFERRING PROVIDER: Meredith Pel, MD  REFERRING DIAG: [phone] (ICD-10-CM) - Biceps tendinosis of left shoulder M25.512 (ICD-10-CM) - Left shoulder pain, unspecified chronicity M65.812 (ICD-10-CM) - Synovitis of left shoulder  THERAPY DIAG:  Left shoulder pain, unspecified chronicity  Stiffness of left shoulder, not elsewhere classified  Muscle weakness (generalized)  Abnormal posture  Rationale for Evaluation and Treatment: Rehabilitation  ONSET DATE: 12/06/22 Lt shoulder arthroscopy and biceps tenodesis  SUBJECTIVE:                                                                                                                                                                                      SUBJECTIVE STATEMENT: No specific complaints of pain to report.  Pop noted here and there when shoulder felt tight.  Pop actually felt better.   PERTINENT HISTORY: GERD, HTN, hx palpitations, pulmonary nodules  PAIN:   Location: Lt shoulder How  would you describe your pain? sore Best in past week: 0/10 Aggravating factors: movements, Easing factors: ice, tylenol   PRECAUTIONS: s/p Lt shoulder arthroscopic debridement with biceps tenodesis "Passive range of motion active assisted range of motion of the left shoulder with no biceps resistance exercises." Per 12/14/22 MD note  01/07/2023 MD note update:   No lifting with that left arm for the next 2 weeks. Weeks 2-4 from now he is okay to do lifting of 5 pounds. Weeks 4-6 from now he is okay to lift 10 pounds with the left arm.   WEIGHT BEARING RESTRICTIONS: No  FALLS:  Has patient fallen in last 6 months? No   LIVING ENVIRONMENT: 2 story home, main level livable. 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, desk work and up on feet  PLOF: Independent, get back to playing hockey  PATIENT GOALS: reduce pain, return to PLOF - get back to playing hockey  NEXT MD VISIT:   OBJECTIVE:   DIAGNOSTIC FINDINGS:  12/27/2022 12/06/22 shoulder arthroscopy with biceps tenodesis  PATIENT SURVEYS:  01/28/2023:  FOTO update 55  12/27/2022 FOTO 38%, 66 predicted  COGNITION: 12/27/2022 Overall cognitive status: Within functional limits for tasks assessed     SENSATION: 12/27/2022 Light touch grossly intact bilateral UE, pt denies any sensory issues  POSTURE: 12/27/2022 Slightly guarded posture Lt UE, tendency to hold in [facility] IR, forward head  UPPER EXTREMITY ROM:  A/PROM Right 12/27/2022 Left 12/27/2022 Left 01/10/2023 Left 01/17/2023 Left 01/28/2023 Left 02/07/2023 Measured in supine  Shoulder flexion 167/NT NT/92s 120 AROM in supine 135 AROM in supine, AAROM with stick 139 145 AROM in supine 145 AROM  Shoulder abduction 170/NT NT/60s  90 AROM in supine 109 AROM in supine 125 AROM   Shoulder internal rotation     64 AROM in 45 deg abduction supine 70 AROM in 45  deg abduction supine  Shoulder external rotation  NT/18 deg s   47 AROM in 45 deg abduction supine 58 AROM in 45 deg abduction supine  Elbow flexion  Full elbow ROM painless but stiff sensation  Elbow extension        Wrist flexion        Wrist extension         (Blank rows = not tested) (Key: WFL = within functional limits not formally assessed, * = concordant pain, s = stiffness/stretching sensation, NT = not tested)  EVAL: Comments: AROM NT given recency of surgery, MD note recommending AAROM/PROM at present  UPPER EXTREMITY MMT:  MMT Right 01/21/2023 Left 01/21/2023 Left 02/07/2023 Left 02/15/2023  Shoulder flexion  3+/5 5/5   Shoulder extension      Shoulder abduction   4/5   Shoulder extension      Shoulder internal rotation   5/5   Shoulder external rotation   4/5         Prone middle trap    3+/5  Prone lower trap    4/5   (Blank rows = not tested)  EVAL: Comments: NT on eval given recency of surgery  SHOULDER SPECIAL TESTS: 12/27/2022 NT given recency of surgery  JOINT MOBILITY TESTING:  12/27/2022 NT  PALPATION:  12/27/2022 No overt TTP throughout shoulder girdle, mild muscular tightness UT/LS but asympomatic  OBSERVATION:  12/27/2022  Visually inspected surgical site w/ pt verbal consent. Appears WNL, healing well, no erythema or significant swelling. No drainage or excessive warmth   TODAY'S TREATMENT:                                                                                              DATE: 02/15/2023 Therapeutic Exercise:  UBE fwd/back 4 mins each way c 10 sec intervals faster end of each minute lvl 4.0  Prone horizontal abduction 2 x 10 1 lb   Prone scaption 2 x 10  Prone shoulder extension 2 x 15 2 lb  Sidelying Lt shoulder ER 2 lb reactive ball catch 30 sec x 4  Standing green band ER c flexion punch Lt arm 3 x 10   Chest press machine serratus Lt arm only 10 lbs 5 sec hold 2 x 10   Lat pull down 2 x 15 25 lbs  Manual  Supine Lt  shoulder posterior glide mobilization c movement with passive  ER, then IR  Inferior glides in abduction end range g3-g4 Lt shoulder  TODAY'S TREATMENT:                                                                                              DATE: 02/12/2023 Therapeutic Exercise:  UBE fwd/back 4 mins each way c 10 sec intervals faster end of each minute lvl 4.0  Prone horizontal abduction 2 x 10   Prone scaption 2 x 10  Prone shoulder extension 2 x 15 2 lb  Pink pball wall flexion walk ups  x 10  Pink pball overshoulder bounces   Standing blue band eccentric only Lt shoulder ER c towel under arm 2 x 15 (2-3 sec)  Chest press machine serratus Lt arm only 10 lbs 5 sec hold x 10   Manual  Supine Lt shoulder posterior glide mobilization c movement with passive  ER, then IR  Inferior glides in abduction end range g3-g4 Lt shoulder  TODAY'S TREATMENT:                                                                                              DATE: 02/07/2023  Therapeutic Exercise:  Pulley flexion, scaption 3 mins each direction   Standing wall flexion c lift off x 10   Standing wand AAROM abduction back against wall 5 sec hold x 10   Standing green band ER c flexion punch to 90 deg Lt arm 2 x 10   Wall Push up plus 3 sec hold at top x 20   Machine rows 2 x 15 20 lbs  Machine lat pull down 2 x 15 20 lbs   Manual  Supine Lt shoulder posterior glide mobilization c movement with active ER.  Inferior glides in abduction end range g3-g4 Lt shoulder   PATIENT EDUCATION: 01/14/2023 Education details: HEP update Person educated: Patient Education method: Explanation, Demonstration, Verbal cues, and Handouts Education comprehension: verbalized understanding, returned demonstration  HOME EXERCISE PROGRAM: Access Code: [facility] URL: [email or web address] Date: 02/15/2023 Prepared by: Scot Jun  Exercises - Sleeper Stretch  - 2 x daily - 7 x weekly - 1 sets - 5 reps - 30  hold - Single Arm Doorway Pec Stretch at 90 Degrees Abduction (Mirrored)  - 2-3 x daily - 7 x weekly - 1 sets - 5 reps - 30 hold - Shoulder External Rotation with Anchored Resistance  - 2 x daily - 7 x weekly - 3 sets - 10 reps - Shoulder Extension with Resistance  - 1-2 x daily - 7 x weekly - 1-2 sets - 10-15 reps - Standing Bilateral Low Shoulder Row with Anchored Resistance  - 1-2 x daily - 7 x weekly - 2-3 sets - 10-15 reps - Prone Single Arm Shoulder Y (Mirrored)  - 1 x daily - 7 x weekly - 3 sets - 10-15 reps - Prone Shoulder Horizontal Abduction (Mirrored)  - 1 x daily - 7 x weekly - 3 sets - 10 reps - Standing Shoulder Flexion to 90 Degrees with Dumbbells  - 1 x daily - 7 x weekly - 3 sets - 10 reps - Shoulder Abduction with Dumbbells - Thumbs Up  - 1 x daily - 7 x weekly - 3 sets - 10 reps  ASSESSMENT:  CLINICAL IMPRESSION: Adjustments to HEP to reflect progression on several strengthening interventions with good result and control in clinic.  Occasional pop noted in Lt shoulder when tightness feeling is present that results in improved symptom (noted in wall push up at times for HEP).     OBJECTIVE IMPAIRMENTS: decreased activity tolerance, decreased mobility, decreased ROM, decreased strength, hypomobility, increased edema, impaired flexibility, impaired UE functional use, postural dysfunction,  and pain.   ACTIVITY LIMITATIONS: carrying, lifting, sitting, sleeping, bathing, dressing, reach over head, and hygiene/grooming  PARTICIPATION LIMITATIONS: meal prep, cleaning, laundry, driving, community activity, and occupation  PERSONAL FACTORS: 3+ comorbidities: HTN, pulmonary nodules, hx palpitations  are also affecting patient's functional outcome.   REHAB POTENTIAL: Good  CLINICAL DECISION MAKING: Stable/uncomplicated  EVALUATION COMPLEXITY: Low   GOALS: Goals reviewed with patient? No  SHORT TERM GOALS: Target date: 01/31/2023    Pt will demonstrate appropriate  understanding and performance of initially prescribed HEP in order to facilitate improved independence with management of symptoms.  Baseline: HEP provided on eval Goal status: Met 01/23/2023  2. Pt will score greater than or equal to 51% on FOTO in order to demonstrate improved perception of function due to symptoms.  Baseline: 38%  Goal status: Met 01/28/2023  LONG TERM GOALS: Target date: 03/07/2023 Pt will score 66% on FOTO in order to demonstrate improved perception of function due to symptoms. Baseline: 38% Goal status: on going 02/07/2023  2.  Pt will demonstrate at least 150 degrees of L active shoulder elevation in order to demonstrate improved tolerance to functional movement patterns such as upper body dressing and reaching overhead.  Baseline: see ROM chart above - AROM NT on eval given recency of surgery Goal status: on going 02/07/2023  3.  Pt will demonstrate at least 4+/5 shoulder flexion/abduction MMT for improved symmetry of UE strength and improved tolerance to functional movements.  Baseline: see MMT chart above - deferred on eval given recency of surgery Goal status: on going 02/07/2023  4. Pt will report/demonstrate ability to lift up to 10 lbs with less than 2 point increase in pain on NPS in order to indicative improved tolerance/independence with heavier activities around the home.   Baseline: NT given recency of surgery  Goal status: on going 02/07/2023   PLAN:  PT FREQUENCY: 2x/week  PT DURATION: 10 weeks  PLANNED INTERVENTIONS: Therapeutic exercises, Therapeutic activity, Neuromuscular re-education, Balance training, Gait training, Patient/Family education, Self Care, Joint mobilization, Dry Needling, Electrical stimulation, Spinal mobilization, Cryotherapy, Moist heat, scar mobilization, Taping, Vasopneumatic device, Manual therapy, and Re-evaluation; above unless contraindicated  PLAN FOR NEXT SESSION:   Monitor the pop symptom reported.  Continue to improve end  ranges and strengthening.    Scot Jun, PT, DPT, OCS, ATC 02/15/23  8:43 AM

## 2023-02-18 ENCOUNTER — Encounter: Payer: BC Managed Care – PPO | Admitting: Orthopedic Surgery

## 2023-02-18 ENCOUNTER — Encounter: Payer: Self-pay | Admitting: Rehabilitative and Restorative Service Providers"

## 2023-02-18 ENCOUNTER — Ambulatory Visit (INDEPENDENT_AMBULATORY_CARE_PROVIDER_SITE_OTHER): Payer: BC Managed Care – PPO | Admitting: Rehabilitative and Restorative Service Providers"

## 2023-02-18 ENCOUNTER — Ambulatory Visit (INDEPENDENT_AMBULATORY_CARE_PROVIDER_SITE_OTHER): Payer: BC Managed Care – PPO | Admitting: Orthopedic Surgery

## 2023-02-18 DIAGNOSIS — M67814 Other specified disorders of tendon, left shoulder: Secondary | ICD-10-CM

## 2023-02-18 DIAGNOSIS — M25512 Pain in left shoulder: Secondary | ICD-10-CM

## 2023-02-18 DIAGNOSIS — M25612 Stiffness of left shoulder, not elsewhere classified: Secondary | ICD-10-CM

## 2023-02-18 DIAGNOSIS — M6281 Muscle weakness (generalized): Secondary | ICD-10-CM

## 2023-02-18 DIAGNOSIS — R293 Abnormal posture: Secondary | ICD-10-CM

## 2023-02-18 NOTE — Therapy (Signed)
OUTPATIENT PHYSICAL THERAPY TREATMENT   Patient Name: Todd Bailey MRN: [id number] DOB:1961-05-07, 62 y.o., male Today's Date: 02/18/2023  END OF SESSION:  PT End of Session - 02/18/23 1604     Visit Number 15    Number of Visits 21    Date for PT Re-Evaluation 02/21/23    Authorization Type BCBS    PT Start Time 1600    PT Stop Time 1639    PT Time Calculation (min) 39 min    Activity Tolerance Patient tolerated treatment well    Behavior During Therapy [facility] for tasks assessed/performed                     Past Medical History:  Diagnosis Date   Arthritis    Elevated coronary artery calcium score 07/2022   96th %'tile   GERD (gastroesophageal reflux disease)    Hypercholesterolemia 08/2020   TLC->imp 12/2020   Hypertension    Obesity, Class I, BMI 30-34.9    Palpitation 04/2020   Zio patch x 3d -->occ PAC and PVC; triggered events correlate with times of PVCs.  Reassured.   Pulmonary nodules    on coronary ca score CT 08/28/22-->f/u CT 3-6 mo (Dr. Einar Gip)   Past Surgical History:  Procedure Laterality Date   BICEPT TENODESIS Left 12/06/2022   Procedure: BICEPS TENODESIS;  Surgeon: Meredith Pel, MD;  Location: [place];  Service: Orthopedics;  Laterality: Left;   CHOLECYSTECTOMY Right 09/14/2021   Procedure: LAPAROSCOPIC CHOLECYSTECTOMY WITH INTRAOPERATIVE CHOLANGIOGRAM,;  Surgeon: Greer Pickerel, MD;  Location: [facility];  Service: General;  Laterality: Right;   COLONOSCOPY  approx 2018   ? polyp, unclear on recall time--get records.   coron calcium score     08/27/22: 96th%'tile (Dr. Einar Gip)   ESOPHAGEAL DILATION  2001   Dr. Amedeo Plenty.  Had it done twice (GERD-induced stricture)   Heart Rhythm monitoring  04/2020   Zio patch x 3d -->occ PAC and PVC; triggered events correlate with times of PVCs.  Reassured.   LEFT HEART CATH AND CORONARY ANGIOGRAPHY N/A 10/23/2022   Procedure: LEFT HEART CATH AND CORONARY ANGIOGRAPHY;  Surgeon: Adrian Prows, MD;  Location:  [place] [facility];  Service: Cardiovascular;  Laterality: N/A;   SHOULDER ARTHROSCOPY Left 12/06/2022   Procedure: left shoulder arthroscopy, debridement, bursectomy;  Surgeon: Meredith Pel, MD;  Location: [place];  Service: Orthopedics;  Laterality: Left;   TRANSTHORACIC ECHOCARDIOGRAM  09/28/2022   normal   Patient Active Problem List   Diagnosis Date Noted   Biceps tendinitis of left shoulder 12/08/2022   Degenerative superior labral anterior-to-posterior (SLAP) tear of left shoulder 12/08/2022   Coronary artery disease of native artery of native heart with stable angina pectoris ([place])    Cholelithiasis 09/12/2021   Cholecystitis 09/12/2021   H/O adenomatous polyp of colon 04/05/2016   De Quervain's tenosynovitis, right 12/27/2014   Arthritis 12/06/2014   Essential hypertension 12/06/2014   GERD (gastroesophageal reflux disease) 12/06/2014   Hyperlipemia 12/06/2014   Prediabetes 12/06/2014    PCP: Tammi Sou, MD  REFERRING PROVIDER: Meredith Pel, MD  REFERRING DIAG: [phone] (ICD-10-CM) - Biceps tendinosis of left shoulder M25.512 (ICD-10-CM) - Left shoulder pain, unspecified chronicity M65.812 (ICD-10-CM) - Synovitis of left shoulder  THERAPY DIAG:  Left shoulder pain, unspecified chronicity  Stiffness of left shoulder, not elsewhere classified  Muscle weakness (generalized)  Abnormal posture  Rationale for Evaluation and Treatment: Rehabilitation  ONSET DATE: 12/06/22 Lt shoulder arthroscopy and biceps tenodesis  SUBJECTIVE:                                                                                                                                                                                      SUBJECTIVE STATEMENT: He indicated having no pain since last visit.  He indicated seeing MD prior to coming to visit with plan to see him a few weeks from now for release.   PERTINENT HISTORY: GERD, HTN, hx palpitations, pulmonary nodules  PAIN:    Location: Lt shoulder How would you describe your pain? sore Best in past week: 0/10 Aggravating factors: movements, Easing factors: ice, tylenol   PRECAUTIONS: s/p Lt shoulder arthroscopic debridement with biceps tenodesis "Passive range of motion active assisted range of motion of the left shoulder with no biceps resistance exercises." Per 12/14/22 MD note  01/07/2023 MD note update:   No lifting with that left arm for the next 2 weeks. Weeks 2-4 from now he is okay to do lifting of 5 pounds. Weeks 4-6 from now he is okay to lift 10 pounds with the left arm.   WEIGHT BEARING RESTRICTIONS: No  FALLS:  Has patient fallen in last 6 months? No   LIVING ENVIRONMENT: 2 story home, main level livable. 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, desk work and up on feet  PLOF: Independent, get back to playing hockey  PATIENT GOALS: reduce pain, return to PLOF - get back to playing hockey  NEXT MD VISIT:   OBJECTIVE:   DIAGNOSTIC FINDINGS:  12/27/2022 12/06/22 shoulder arthroscopy with biceps tenodesis  PATIENT SURVEYS:  01/28/2023:  FOTO update 55  12/27/2022 FOTO 38%, 66 predicted  COGNITION: 12/27/2022 Overall cognitive status: Within functional limits for tasks assessed     SENSATION: 12/27/2022 Light touch grossly intact bilateral UE, pt denies any sensory issues  POSTURE: 12/27/2022 Slightly guarded posture Lt UE, tendency to hold in [facility] IR, forward head  UPPER EXTREMITY ROM:  A/PROM Right 12/27/2022 Left 12/27/2022 Left 01/10/2023 Left 01/17/2023 Left 01/28/2023 Left 02/07/2023 Measured in supine  Shoulder flexion 167/NT NT/92s 120 AROM in supine 135 AROM in supine, AAROM with stick 139 145 AROM in supine 145 AROM  Shoulder abduction 170/NT NT/60s  90 AROM in supine 109 AROM in supine 125 AROM   Shoulder internal rotation     64 AROM in 45 deg  abduction supine 70 AROM in 45 deg abduction supine  Shoulder external rotation  NT/18 deg s   47 AROM in 45 deg abduction supine 58 AROM in 45 deg abduction supine  Elbow flexion  Full elbow ROM painless but stiff sensation      Elbow extension        Wrist flexion        Wrist extension         (Blank rows = not tested) (Key: WFL = within functional limits not formally assessed, * = concordant pain, s = stiffness/stretching sensation, NT = not tested)  EVAL: Comments: AROM NT given recency of surgery, MD note recommending AAROM/PROM at present  UPPER EXTREMITY MMT:  MMT Right 01/21/2023 Left 01/21/2023 Left 02/07/2023 Left 02/15/2023  Shoulder flexion  3+/5 5/5   Shoulder extension      Shoulder abduction   4/5   Shoulder extension      Shoulder internal rotation   5/5   Shoulder external rotation   4/5         Prone middle trap    3+/5  Prone lower trap    4/5   (Blank rows = not tested)  EVAL: Comments: NT on eval given recency of surgery  SHOULDER SPECIAL TESTS: 12/27/2022 NT given recency of surgery  JOINT MOBILITY TESTING:  12/27/2022 NT  PALPATION:  12/27/2022 No overt TTP throughout shoulder girdle, mild muscular tightness UT/LS but asympomatic  OBSERVATION:  12/27/2022  Visually inspected surgical site w/ pt verbal consent. Appears WNL, healing well, no erythema or significant swelling. No drainage or excessive warmth    TODAY'S TREATMENT:                                                                                              DATE: 02/18/2023 Therapeutic Exercise:  UBE fwd/back 4 mins each way c 10 sec intervals faster end of each minute lvl 3.0  Prone horizontal abduction 2 x 15 1 lb   Prone scaption 2 x 15 1 lb   Sidelying Lt shoulder ER 2 lb reactive ball catch 30 sec x 5  Standing green band ER c flexion punch Lt arm 3 x 10   quadruped on knees push up hold SA press bilateral 5 sec hold x 10  Quadruped on knees alternating arm reactive light  touching 20 seconds x 4  Sitting Lt arm flexion 90 deg ball reactive ball drop catches x 20, x 30 2 lb ball  Lat pull down 2 x 15 25 lbs   TODAY'S TREATMENT:                                                                                              DATE: 02/15/2023 Therapeutic Exercise:  UBE fwd/back 4 mins each way c 10 sec intervals faster end of each minute lvl 4.0  Prone horizontal abduction 2 x 10 1 lb   Prone scaption 2 x 10  Prone shoulder  extension 2 x 15 2 lb  Sidelying Lt shoulder ER 2 lb reactive ball catch 30 sec x 4  Standing green band ER c flexion punch Lt arm 3 x 10   Chest press machine serratus Lt arm only 10 lbs 5 sec hold 2 x 10   Lat pull down 2 x 15 25 lbs  Manual  Supine Lt shoulder posterior glide mobilization c movement with passive  ER, then IR  Inferior glides in abduction end range g3-g4 Lt shoulder  TODAY'S TREATMENT:                                                                                              DATE: 02/12/2023 Therapeutic Exercise:  UBE fwd/back 4 mins each way c 10 sec intervals faster end of each minute lvl 4.0  Prone horizontal abduction 2 x 10   Prone scaption 2 x 10  Prone shoulder extension 2 x 15 2 lb  Pink pball wall flexion walk ups x 10  Pink pball overshoulder bounces   Standing blue band eccentric only Lt shoulder ER c towel under arm 2 x 15 (2-3 sec)  Chest press machine serratus Lt arm only 10 lbs 5 sec hold x 10   Manual  Supine Lt shoulder posterior glide mobilization c movement with passive  ER, then IR  Inferior glides in abduction end range g3-g4 Lt shoulder    PATIENT EDUCATION: 01/14/2023 Education details: HEP update Person educated: Patient Education method: Consulting civil engineer, Demonstration, Verbal cues, and Handouts Education comprehension: verbalized understanding, returned demonstration  HOME EXERCISE PROGRAM: Access Code: [facility] URL: [email or web address] Date: 02/15/2023 Prepared by:  Scot Jun  Exercises - Sleeper Stretch  - 2 x daily - 7 x weekly - 1 sets - 5 reps - 30 hold - Single Arm Doorway Pec Stretch at 90 Degrees Abduction (Mirrored)  - 2-3 x daily - 7 x weekly - 1 sets - 5 reps - 30 hold - Shoulder External Rotation with Anchored Resistance  - 2 x daily - 7 x weekly - 3 sets - 10 reps - Shoulder Extension with Resistance  - 1-2 x daily - 7 x weekly - 1-2 sets - 10-15 reps - Standing Bilateral Low Shoulder Row with Anchored Resistance  - 1-2 x daily - 7 x weekly - 2-3 sets - 10-15 reps - Prone Single Arm Shoulder Y (Mirrored)  - 1 x daily - 7 x weekly - 3 sets - 10-15 reps - Prone Shoulder Horizontal Abduction (Mirrored)  - 1 x daily - 7 x weekly - 3 sets - 10 reps - Standing Shoulder Flexion to 90 Degrees with Dumbbells  - 1 x daily - 7 x weekly - 3 sets - 10 reps - Shoulder Abduction with Dumbbells - Thumbs Up  - 1 x daily - 7 x weekly - 3 sets - 10 reps  ASSESSMENT:  CLINICAL IMPRESSION: Continued progression in scapular and Lt shoulder strengthening to improve functional movement.  Continued skilled PT services warranted.   Inclusion of WB activity beneficial going forward.   OBJECTIVE IMPAIRMENTS: decreased activity tolerance, decreased mobility, decreased ROM, decreased strength,  hypomobility, increased edema, impaired flexibility, impaired UE functional use, postural dysfunction, and pain.   ACTIVITY LIMITATIONS: carrying, lifting, sitting, sleeping, bathing, dressing, reach over head, and hygiene/grooming  PARTICIPATION LIMITATIONS: meal prep, cleaning, laundry, driving, community activity, and occupation  PERSONAL FACTORS: 3+ comorbidities: HTN, pulmonary nodules, hx palpitations  are also affecting patient's functional outcome.   REHAB POTENTIAL: Good  CLINICAL DECISION MAKING: Stable/uncomplicated  EVALUATION COMPLEXITY: Low   GOALS: Goals reviewed with patient? No  SHORT TERM GOALS: Target date: 01/31/2023    Pt will demonstrate  appropriate understanding and performance of initially prescribed HEP in order to facilitate improved independence with management of symptoms.  Baseline: HEP provided on eval Goal status: Met 01/23/2023  2. Pt will score greater than or equal to 51% on FOTO in order to demonstrate improved perception of function due to symptoms.  Baseline: 38%  Goal status: Met 01/28/2023  LONG TERM GOALS: Target date: 03/07/2023 Pt will score 66% on FOTO in order to demonstrate improved perception of function due to symptoms. Baseline: 38% Goal status: on going 02/07/2023  2.  Pt will demonstrate at least 150 degrees of L active shoulder elevation in order to demonstrate improved tolerance to functional movement patterns such as upper body dressing and reaching overhead.  Baseline: see ROM chart above - AROM NT on eval given recency of surgery Goal status: on going 02/07/2023  3.  Pt will demonstrate at least 4+/5 shoulder flexion/abduction MMT for improved symmetry of UE strength and improved tolerance to functional movements.  Baseline: see MMT chart above - deferred on eval given recency of surgery Goal status: on going 02/07/2023  4. Pt will report/demonstrate ability to lift up to 10 lbs with less than 2 point increase in pain on NPS in order to indicative improved tolerance/independence with heavier activities around the home.   Baseline: NT given recency of surgery  Goal status: on going 02/07/2023   PLAN:  PT FREQUENCY: 2x/week  PT DURATION: 10 weeks  PLANNED INTERVENTIONS: Therapeutic exercises, Therapeutic activity, Neuromuscular re-education, Balance training, Gait training, Patient/Family education, Self Care, Joint mobilization, Dry Needling, Electrical stimulation, Spinal mobilization, Cryotherapy, Moist heat, scar mobilization, Taping, Vasopneumatic device, Manual therapy, and Re-evaluation; above unless contraindicated  PLAN FOR NEXT SESSION:   Progress strengthening gains, WB challenges.     Scot Jun, PT, DPT, OCS, ATC 02/18/23  4:34 PM

## 2023-02-18 NOTE — Progress Notes (Unsigned)
Post-Op Visit Note   Patient: Todd Bailey           Date of Birth: [birth year]           MRN: [id number] Visit Date: 02/18/2023 PCP: Tammi Sou, MD   Assessment & Plan:  Chief Complaint:  Chief Complaint  Patient presents with   Left Shoulder - Follow-up    12/06/22 (10w 4d)shoulder arthroscopy, debridement, bursectomy - Left  Biceps Tenodesis      Visit Diagnoses:  1. Biceps tendinosis of left shoulder     Plan: [name] is now 2 and half months out left shoulder arthroscopy with biceps tenodesis.  Has not yet achieved full range of motion and still has some weakness in the left shoulder.  Last physical therapy plan for 03/04/2023.  On examination he lacks about 10 to 20% of his full range of motion compared to the other side with external rotation abduction and forward flexion.  No Popeye deformity.  Plan is returned to work 03/04/2023.  Home exercise program to focus on stretching and strengthening.  Out of work until 03/04/2023 due to slight weakness and slight decreased range of motion which is not compatible with his work requirements at this time. Follow-Up Instructions: No follow-ups on file.   Orders:  No orders of the defined types were placed in this encounter.  No orders of the defined types were placed in this encounter.   Imaging: No results found.  PMFS History: Patient Active Problem List   Diagnosis Date Noted   Biceps tendinitis of left shoulder 12/08/2022   Degenerative superior labral anterior-to-posterior (SLAP) tear of left shoulder 12/08/2022   Coronary artery disease of native artery of native heart with stable angina pectoris ([place])    Cholelithiasis 09/12/2021   Cholecystitis 09/12/2021   H/O adenomatous polyp of colon 04/05/2016   De Quervain's tenosynovitis, right 12/27/2014   Arthritis 12/06/2014   Essential hypertension 12/06/2014   GERD (gastroesophageal reflux disease) 12/06/2014   Hyperlipemia 12/06/2014   Prediabetes 12/06/2014    Past Medical History:  Diagnosis Date   Arthritis    Elevated coronary artery calcium score 07/2022   96th %'tile   GERD (gastroesophageal reflux disease)    Hypercholesterolemia 08/2020   TLC->imp 12/2020   Hypertension    Obesity, Class I, BMI 30-34.9    Palpitation 04/2020   Zio patch x 3d -->occ PAC and PVC; triggered events correlate with times of PVCs.  Reassured.   Pulmonary nodules    on coronary ca score CT 08/28/22-->f/u CT 3-6 mo (Dr. Einar Gip)    Family History  Problem Relation Age of Onset   Arthritis Mother    Hearing loss Mother    Heart disease Mother    Arthritis Father    Heart disease Father    High Cholesterol Father    High blood pressure Father     Past Surgical History:  Procedure Laterality Date   BICEPT TENODESIS Left 12/06/2022   Procedure: BICEPS TENODESIS;  Surgeon: Meredith Pel, MD;  Location: [place];  Service: Orthopedics;  Laterality: Left;   CHOLECYSTECTOMY Right 09/14/2021   Procedure: LAPAROSCOPIC CHOLECYSTECTOMY WITH INTRAOPERATIVE CHOLANGIOGRAM,;  Surgeon: Greer Pickerel, MD;  Location: [facility];  Service: General;  Laterality: Right;   COLONOSCOPY  approx 2018   ? polyp, unclear on recall time--get records.   coron calcium score     08/27/22: 96th%'tile (Dr. Einar Gip)   ESOPHAGEAL DILATION  2001   Dr. Amedeo Plenty.  Had it done twice (  GERD-induced stricture)   Heart Rhythm monitoring  04/2020   Zio patch x 3d -->occ PAC and PVC; triggered events correlate with times of PVCs.  Reassured.   LEFT HEART CATH AND CORONARY ANGIOGRAPHY N/A 10/23/2022   Procedure: LEFT HEART CATH AND CORONARY ANGIOGRAPHY;  Surgeon: Adrian Prows, MD;  Location: [place] [facility];  Service: Cardiovascular;  Laterality: N/A;   SHOULDER ARTHROSCOPY Left 12/06/2022   Procedure: left shoulder arthroscopy, debridement, bursectomy;  Surgeon: Meredith Pel, MD;  Location: [place];  Service: Orthopedics;  Laterality: Left;   TRANSTHORACIC ECHOCARDIOGRAM  09/28/2022   normal    Social History   Occupational History   Not on file  Tobacco Use   Smoking status: Former    Packs/day: 1.00    Years: 15.00    Total pack years: 15.00    Types: Cigarettes    Quit date: 2001    Years since quitting: 23.1   Smokeless tobacco: Never  Vaping Use   Vaping Use: Never used  Substance and Sexual Activity   Alcohol use: Never   Drug use: Never   Sexual activity: Not on file

## 2023-02-20 ENCOUNTER — Encounter: Payer: Self-pay | Admitting: Orthopedic Surgery

## 2023-02-21 ENCOUNTER — Encounter: Payer: Self-pay | Admitting: Rehabilitative and Restorative Service Providers"

## 2023-02-21 ENCOUNTER — Ambulatory Visit (INDEPENDENT_AMBULATORY_CARE_PROVIDER_SITE_OTHER): Payer: BC Managed Care – PPO | Admitting: Rehabilitative and Restorative Service Providers"

## 2023-02-21 DIAGNOSIS — M25612 Stiffness of left shoulder, not elsewhere classified: Secondary | ICD-10-CM | POA: Diagnosis not present

## 2023-02-21 DIAGNOSIS — M25512 Pain in left shoulder: Secondary | ICD-10-CM

## 2023-02-21 DIAGNOSIS — R293 Abnormal posture: Secondary | ICD-10-CM

## 2023-02-21 DIAGNOSIS — M6281 Muscle weakness (generalized): Secondary | ICD-10-CM | POA: Diagnosis not present

## 2023-02-21 NOTE — Therapy (Signed)
OUTPATIENT PHYSICAL THERAPY TREATMENT   Patient Name: Todd Bailey MRN: [id number] DOB:14-Jul-1961, 62 y.o., male Today's Date: 02/21/2023  END OF SESSION:  PT End of Session - 02/21/23 1613     Visit Number 16    Number of Visits 21    Date for PT Re-Evaluation 02/21/23    Authorization Type [employer]    PT Start Time 1545    PT Stop Time 1624    PT Time Calculation (min) 39 min    Activity Tolerance Patient tolerated treatment well    Behavior During Therapy [facility] for tasks assessed/performed                      Past Medical History:  Diagnosis Date   Arthritis    Elevated coronary artery calcium score 07/2022   96th %'tile   GERD (gastroesophageal reflux disease)    Hypercholesterolemia 08/2020   TLC->imp 12/2020   Hypertension    Obesity, Class I, BMI 30-34.9    Palpitation 04/2020   Zio patch x 3d -->occ PAC and PVC; triggered events correlate with times of PVCs.  Reassured.   Pulmonary nodules    on coronary ca score CT 08/28/22-->f/u CT 3-6 mo (Dr. Einar Gip)   Past Surgical History:  Procedure Laterality Date   BICEPT TENODESIS Left 12/06/2022   Procedure: BICEPS TENODESIS;  Surgeon: Meredith Pel, MD;  Location: [place];  Service: Orthopedics;  Laterality: Left;   CHOLECYSTECTOMY Right 09/14/2021   Procedure: LAPAROSCOPIC CHOLECYSTECTOMY WITH INTRAOPERATIVE CHOLANGIOGRAM,;  Surgeon: Greer Pickerel, MD;  Location: [facility];  Service: General;  Laterality: Right;   COLONOSCOPY  approx 2018   ? polyp, unclear on recall time--get records.   coron calcium score     08/27/22: 96th%'tile (Dr. Einar Gip)   ESOPHAGEAL DILATION  2001   Dr. Amedeo Plenty.  Had it done twice (GERD-induced stricture)   Heart Rhythm monitoring  04/2020   Zio patch x 3d -->occ PAC and PVC; triggered events correlate with times of PVCs.  Reassured.   LEFT HEART CATH AND CORONARY ANGIOGRAPHY N/A 10/23/2022   Procedure: LEFT HEART CATH AND CORONARY ANGIOGRAPHY;  Surgeon: Adrian Prows, MD;  Location:  [place] [facility];  Service: Cardiovascular;  Laterality: N/A;   SHOULDER ARTHROSCOPY Left 12/06/2022   Procedure: left shoulder arthroscopy, debridement, bursectomy;  Surgeon: Meredith Pel, MD;  Location: [place];  Service: Orthopedics;  Laterality: Left;   TRANSTHORACIC ECHOCARDIOGRAM  09/28/2022   normal   Patient Active Problem List   Diagnosis Date Noted   Biceps tendinitis of left shoulder 12/08/2022   Degenerative superior labral anterior-to-posterior (SLAP) tear of left shoulder 12/08/2022   Coronary artery disease of native artery of native heart with stable angina pectoris ([place])    Cholelithiasis 09/12/2021   Cholecystitis 09/12/2021   H/O adenomatous polyp of colon 04/05/2016   De Quervain's tenosynovitis, right 12/27/2014   Arthritis 12/06/2014   Essential hypertension 12/06/2014   GERD (gastroesophageal reflux disease) 12/06/2014   Hyperlipemia 12/06/2014   Prediabetes 12/06/2014    PCP: Tammi Sou, MD  REFERRING PROVIDER: Meredith Pel, MD  REFERRING DIAG: [phone] (ICD-10-CM) - Biceps tendinosis of left shoulder M25.512 (ICD-10-CM) - Left shoulder pain, unspecified chronicity M65.812 (ICD-10-CM) - Synovitis of left shoulder  THERAPY DIAG:  Left shoulder pain, unspecified chronicity  Stiffness of left shoulder, not elsewhere classified  Muscle weakness (generalized)  Abnormal posture  Rationale for Evaluation and Treatment: Rehabilitation  ONSET DATE: 12/06/22 Lt shoulder arthroscopy and biceps  tenodesis  SUBJECTIVE:                                                                                                                                                                                      SUBJECTIVE STATEMENT: He indicated no complaint of pain or increased trouble of soreness.   PERTINENT HISTORY: GERD, HTN, hx palpitations, pulmonary nodules  PAIN:   Location: Lt shoulder How would you describe your pain? sore Best in past  week: 0/10 Aggravating factors: movements, Easing factors: ice, tylenol   PRECAUTIONS: s/p Lt shoulder arthroscopic debridement with biceps tenodesis "Passive range of motion active assisted range of motion of the left shoulder with no biceps resistance exercises." Per 12/14/22 MD note  01/07/2023 MD note update:   No lifting with that left arm for the next 2 weeks. Weeks 2-4 from now he is okay to do lifting of 5 pounds. Weeks 4-6 from now he is okay to lift 10 pounds with the left arm.   WEIGHT BEARING RESTRICTIONS: No  FALLS:  Has patient fallen in last 6 months? No   LIVING ENVIRONMENT: 2 story home, main level livable. 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, desk work and up on feet  PLOF: Independent, get back to playing hockey  PATIENT GOALS: reduce pain, return to PLOF - get back to playing hockey  NEXT MD VISIT:   OBJECTIVE:   DIAGNOSTIC FINDINGS:  12/27/2022 12/06/22 shoulder arthroscopy with biceps tenodesis  PATIENT SURVEYS:  02/21/2023:  FOTO update 63  01/28/2023:  FOTO update 55  12/27/2022 FOTO 38%, 66 predicted  COGNITION: 12/27/2022 Overall cognitive status: Within functional limits for tasks assessed     SENSATION: 12/27/2022 Light touch grossly intact bilateral UE, pt denies any sensory issues  POSTURE: 12/27/2022 Slightly guarded posture Lt UE, tendency to hold in [facility] IR, forward head  UPPER EXTREMITY ROM:  A/PROM Right 12/27/2022 Left 12/27/2022 Left 01/10/2023 Left 01/17/2023 Left 01/28/2023 Left 02/07/2023 Measured in supine  Shoulder flexion 167/NT NT/92s 120 AROM in supine 135 AROM in supine, AAROM with stick 139 145 AROM in supine 145 AROM  Shoulder abduction 170/NT NT/60s  90 AROM in supine 109 AROM in supine 125 AROM   Shoulder internal rotation     64 AROM in 45 deg abduction supine 70 AROM in 45 deg abduction supine   Shoulder external rotation  NT/18 deg s   47 AROM in 45 deg abduction supine 58 AROM in 45 deg abduction supine  Elbow flexion  Full elbow ROM painless but stiff sensation  Elbow extension        Wrist flexion        Wrist extension         (Blank rows = not tested) (Key: WFL = within functional limits not formally assessed, * = concordant pain, s = stiffness/stretching sensation, NT = not tested)  EVAL: Comments: AROM NT given recency of surgery, MD note recommending AAROM/PROM at present  UPPER EXTREMITY MMT:  MMT Right 01/21/2023 Left 01/21/2023 Left 02/07/2023 Left 02/15/2023  Shoulder flexion  3+/5 5/5   Shoulder extension      Shoulder abduction   4/5   Shoulder extension      Shoulder internal rotation   5/5   Shoulder external rotation   4/5         Prone middle trap    3+/5  Prone lower trap    4/5   (Blank rows = not tested)  EVAL: Comments: NT on eval given recency of surgery  SHOULDER SPECIAL TESTS: 12/27/2022 NT given recency of surgery  JOINT MOBILITY TESTING:  12/27/2022 NT  PALPATION:  12/27/2022 No overt TTP throughout shoulder girdle, mild muscular tightness UT/LS but asympomatic  OBSERVATION:  12/27/2022  Visually inspected surgical site w/ pt verbal consent. Appears WNL, healing well, no erythema or significant swelling. No drainage or excessive warmth    TODAY'S TREATMENT:                                                                                              DATE: 02/21/2023 Therapeutic Exercise:  UBE fwd/back 4 mins each way 4 mins   Prone horizontal abduction 2 x 10 2 lb   Prone scaption 2 x 10 1 lb   Standing green band ER c flexion punch Lt arm x 15, x 10 c flexion punch and trunk rotation to face origin of band  quadruped plank on knees hold SA press bilateral 5 sec hold x 10  Counter push up positioning alt shoulder touching x 20 each, push up plus hold 2 x 10   Standing shoulder abduction to 100 deg c anterior red band resistance  2 x 10   TODAY'S TREATMENT:                                                                                              DATE: 02/18/2023 Therapeutic Exercise:  UBE fwd/back 4 mins each way c 10 sec intervals faster end of each minute lvl 3.0  Prone horizontal abduction 2 x 15 1 lb   Prone scaption 2 x 15 1 lb   Sidelying Lt shoulder ER 2 lb reactive ball catch 30 sec x 5  Standing green band ER c flexion punch Lt arm 3 x 10   quadruped on  knees push up hold SA press bilateral 5 sec hold x 10  Quadruped on knees alternating arm reactive light touching 20 seconds x 4  Sitting Lt arm flexion 90 deg ball reactive ball drop catches x 20, x 30 2 lb ball  Lat pull down 2 x 15 25 lbs   TODAY'S TREATMENT:                                                                                              DATE: 02/15/2023 Therapeutic Exercise:  UBE fwd/back 4 mins each way c 10 sec intervals faster end of each minute lvl 4.0  Prone horizontal abduction 2 x 10 1 lb   Prone scaption 2 x 10  Prone shoulder extension 2 x 15 2 lb  Sidelying Lt shoulder ER 2 lb reactive ball catch 30 sec x 4  Standing green band ER c flexion punch Lt arm 3 x 10   Chest press machine serratus Lt arm only 10 lbs 5 sec hold 2 x 10   Lat pull down 2 x 15 25 lbs  Manual  Supine Lt shoulder posterior glide mobilization c movement with passive  ER, then IR  Inferior glides in abduction end range g3-g4 Lt shoulder   PATIENT EDUCATION: 01/14/2023 Education details: HEP update Person educated: Patient Education method: Consulting civil engineer, Demonstration, Verbal cues, and Handouts Education comprehension: verbalized understanding, returned demonstration  HOME EXERCISE PROGRAM: Access Code: [facility] URL: [email or web address] Date: 02/15/2023 Prepared by: Scot Jun  Exercises - Sleeper Stretch  - 2 x daily - 7 x weekly - 1 sets - 5 reps - 30 hold - Single Arm Doorway Pec Stretch at 90 Degrees Abduction (Mirrored)   - 2-3 x daily - 7 x weekly - 1 sets - 5 reps - 30 hold - Shoulder External Rotation with Anchored Resistance  - 2 x daily - 7 x weekly - 3 sets - 10 reps - Shoulder Extension with Resistance  - 1-2 x daily - 7 x weekly - 1-2 sets - 10-15 reps - Standing Bilateral Low Shoulder Row with Anchored Resistance  - 1-2 x daily - 7 x weekly - 2-3 sets - 10-15 reps - Prone Single Arm Shoulder Y (Mirrored)  - 1 x daily - 7 x weekly - 3 sets - 10-15 reps - Prone Shoulder Horizontal Abduction (Mirrored)  - 1 x daily - 7 x weekly - 3 sets - 10 reps - Standing Shoulder Flexion to 90 Degrees with Dumbbells  - 1 x daily - 7 x weekly - 3 sets - 10 reps - Shoulder Abduction with Dumbbells - Thumbs Up  - 1 x daily - 7 x weekly - 3 sets - 10 reps  ASSESSMENT:  CLINICAL IMPRESSION: Continued progression in scapular and Lt shoulder strengthening to improve functional movement.  Continued skilled PT services warranted.   Inclusion of WB activity beneficial going forward.   OBJECTIVE IMPAIRMENTS: decreased activity tolerance, decreased mobility, decreased ROM, decreased strength, hypomobility, increased edema, impaired flexibility, impaired UE functional use, postural dysfunction, and pain.   ACTIVITY LIMITATIONS: carrying, lifting, sitting, sleeping, bathing, dressing, reach over head, and  hygiene/grooming  PARTICIPATION LIMITATIONS: meal prep, cleaning, laundry, driving, community activity, and occupation  PERSONAL FACTORS: 3+ comorbidities: HTN, pulmonary nodules, hx palpitations  are also affecting patient's functional outcome.   REHAB POTENTIAL: Good  CLINICAL DECISION MAKING: Stable/uncomplicated  EVALUATION COMPLEXITY: Low   GOALS: Goals reviewed with patient? No  SHORT TERM GOALS: Target date: 01/31/2023    Pt will demonstrate appropriate understanding and performance of initially prescribed HEP in order to facilitate improved independence with management of symptoms.  Baseline: HEP provided on  eval Goal status: Met 01/23/2023  2. Pt will score greater than or equal to 51% on FOTO in order to demonstrate improved perception of function due to symptoms.  Baseline: 38%  Goal status: Met 01/28/2023  LONG TERM GOALS: Target date: 03/07/2023 Pt will score 66% on FOTO in order to demonstrate improved perception of function due to symptoms. Baseline: 38% Goal status: on going 02/07/2023  2.  Pt will demonstrate at least 150 degrees of L active shoulder elevation in order to demonstrate improved tolerance to functional movement patterns such as upper body dressing and reaching overhead.  Baseline: see ROM chart above - AROM NT on eval given recency of surgery Goal status: on going 02/07/2023  3.  Pt will demonstrate at least 4+/5 shoulder flexion/abduction MMT for improved symmetry of UE strength and improved tolerance to functional movements.  Baseline: see MMT chart above - deferred on eval given recency of surgery Goal status: on going 02/07/2023  4. Pt will report/demonstrate ability to lift up to 10 lbs with less than 2 point increase in pain on NPS in order to indicative improved tolerance/independence with heavier activities around the home.   Baseline: NT given recency of surgery  Goal status: on going 02/07/2023   PLAN:  PT FREQUENCY: 2x/week  PT DURATION: 10 weeks  PLANNED INTERVENTIONS: Therapeutic exercises, Therapeutic activity, Neuromuscular re-education, Balance training, Gait training, Patient/Family education, Self Care, Joint mobilization, Dry Needling, Electrical stimulation, Spinal mobilization, Cryotherapy, Moist heat, scar mobilization, Taping, Vasopneumatic device, Manual therapy, and Re-evaluation; above unless contraindicated  PLAN FOR NEXT SESSION: Recert   Scot Jun, PT, DPT, OCS, ATC 02/21/23  4:24 PM

## 2023-02-25 ENCOUNTER — Encounter: Payer: BC Managed Care – PPO | Admitting: Rehabilitative and Restorative Service Providers"

## 2023-02-28 ENCOUNTER — Ambulatory Visit (INDEPENDENT_AMBULATORY_CARE_PROVIDER_SITE_OTHER): Payer: BC Managed Care – PPO | Admitting: Rehabilitative and Restorative Service Providers"

## 2023-02-28 ENCOUNTER — Encounter: Payer: Self-pay | Admitting: Rehabilitative and Restorative Service Providers"

## 2023-02-28 DIAGNOSIS — R293 Abnormal posture: Secondary | ICD-10-CM | POA: Diagnosis not present

## 2023-02-28 DIAGNOSIS — M6281 Muscle weakness (generalized): Secondary | ICD-10-CM

## 2023-02-28 DIAGNOSIS — M25512 Pain in left shoulder: Secondary | ICD-10-CM | POA: Diagnosis not present

## 2023-02-28 DIAGNOSIS — M25612 Stiffness of left shoulder, not elsewhere classified: Secondary | ICD-10-CM | POA: Diagnosis not present

## 2023-02-28 NOTE — Therapy (Signed)
OUTPATIENT PHYSICAL THERAPY TREATMENT /RECERT   Patient Name: Todd Bailey MRN: [id number] DOB:Jul 26, 1961, 62 y.o., male Today's Date: 02/28/2023   Progress Note Reporting Period 12/28/2023 to 02/28/2023  See note below for Objective Data and Assessment of Progress/Goals.      END OF SESSION:  PT End of Session - 02/28/23 1559     Visit Number 17    Number of Visits 18    Date for PT Re-Evaluation 03/07/23    Authorization Type [employer]    PT Start Time 1556    PT Stop Time 1623    PT Time Calculation (min) 27 min    Activity Tolerance Patient tolerated treatment well    Behavior During Therapy WFL for tasks assessed/performed             Past Medical History:  Diagnosis Date   Arthritis    Elevated coronary artery calcium score 07/2022   96th %'tile   GERD (gastroesophageal reflux disease)    Hypercholesterolemia 08/2020   TLC->imp 12/2020   Hypertension    Obesity, Class I, BMI 30-34.9    Palpitation 04/2020   Zio patch x 3d -->occ PAC and PVC; triggered events correlate with times of PVCs.  Reassured.   Pulmonary nodules    on coronary ca score CT 08/28/22-->f/u CT 3-6 mo (Dr. Einar Gip)   Past Surgical History:  Procedure Laterality Date   BICEPT TENODESIS Left 12/06/2022   Procedure: BICEPS TENODESIS;  Surgeon: Meredith Pel, MD;  Location: [facility];  Service: Orthopedics;  Laterality: Left;   CHOLECYSTECTOMY Right 09/14/2021   Procedure: LAPAROSCOPIC CHOLECYSTECTOMY WITH INTRAOPERATIVE CHOLANGIOGRAM,;  Surgeon: Greer Pickerel, MD;  Location: [facility];  Service: General;  Laterality: Right;   COLONOSCOPY  approx 2018   ? polyp, unclear on recall time--get records.   coron calcium score     08/27/22: 96th%'tile (Dr. Einar Gip)   ESOPHAGEAL DILATION  2001   Dr. Amedeo Plenty.  Had it done twice (GERD-induced stricture)   Heart Rhythm monitoring  04/2020   Zio patch x 3d -->occ PAC and PVC; triggered events correlate with times of PVCs.  Reassured.   LEFT HEART CATH AND  CORONARY ANGIOGRAPHY N/A 10/23/2022   Procedure: LEFT HEART CATH AND CORONARY ANGIOGRAPHY;  Surgeon: Adrian Prows, MD;  Location: [facility];  Service: Cardiovascular;  Laterality: N/A;   SHOULDER ARTHROSCOPY Left 12/06/2022   Procedure: left shoulder arthroscopy, debridement, bursectomy;  Surgeon: Meredith Pel, MD;  Location: [facility];  Service: Orthopedics;  Laterality: Left;   TRANSTHORACIC ECHOCARDIOGRAM  09/28/2022   normal   Patient Active Problem List   Diagnosis Date Noted   Biceps tendinitis of left shoulder 12/08/2022   Degenerative superior labral anterior-to-posterior (SLAP) tear of left shoulder 12/08/2022   Coronary artery disease of native artery of native heart with stable angina pectoris ([place])    Cholelithiasis 09/12/2021   Cholecystitis 09/12/2021   H/O adenomatous polyp of colon 04/05/2016   De Quervain's tenosynovitis, right 12/27/2014   Arthritis 12/06/2014   Essential hypertension 12/06/2014   GERD (gastroesophageal reflux disease) 12/06/2014   Hyperlipemia 12/06/2014   Prediabetes 12/06/2014    PCP: Tammi Sou, MD  REFERRING PROVIDER: Meredith Pel, MD  REFERRING DIAG: [phone] (ICD-10-CM) - Biceps tendinosis of left shoulder M25.512 (ICD-10-CM) - Left shoulder pain, unspecified chronicity M65.812 (ICD-10-CM) - Synovitis of left shoulder  THERAPY DIAG:  Left shoulder pain, unspecified chronicity  Stiffness of left shoulder, not elsewhere classified  Muscle weakness (generalized)  Abnormal posture  Rationale for Evaluation and Treatment: Rehabilitation  ONSET DATE: 12/06/22 Lt shoulder arthroscopy and biceps tenodesis  SUBJECTIVE:                                                                                                                                                                                      SUBJECTIVE STATEMENT: No specific complaints of symptoms reported today.  No specific daily things being avoided at this  time other than the lifting restriction from MD office.     PERTINENT HISTORY: GERD, HTN, hx palpitations, pulmonary nodules  PAIN:   Location: Lt shoulder How would you describe your pain? sore Best in past week: 0/10 Aggravating factors: movements, Easing factors: ice, tylenol   PRECAUTIONS: s/p Lt shoulder arthroscopic debridement with biceps tenodesis "Passive range of motion active assisted range of motion of the left shoulder with no biceps resistance exercises." Per 12/14/22 MD note  01/07/2023 MD note update:   No lifting with that left arm for the next 2 weeks. Weeks 2-4 from now he is okay to do lifting of 5 pounds. Weeks 4-6 from now he is okay to lift 10 pounds with the left arm.   WEIGHT BEARING RESTRICTIONS: No  FALLS:  Has patient fallen in last 6 months? No   LIVING ENVIRONMENT: 2 story home, main level livable. 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, desk work and up on feet  PLOF: Independent, get back to playing hockey  PATIENT GOALS: reduce pain, return to PLOF - get back to playing hockey  NEXT MD VISIT:   OBJECTIVE:   DIAGNOSTIC FINDINGS:  12/27/2022 12/06/22 shoulder arthroscopy with biceps tenodesis  PATIENT SURVEYS:  02/21/2023:  FOTO update 63  01/28/2023:  FOTO update 55  12/27/2022 FOTO 38%, 66 predicted  COGNITION: 12/27/2022 Overall cognitive status: Within functional limits for tasks assessed     SENSATION: 12/27/2022 Light touch grossly intact bilateral UE, pt denies any sensory issues  POSTURE: 12/27/2022 Slightly guarded posture Lt UE, tendency to hold in [facility] IR, forward head  UPPER EXTREMITY ROM:  A/PROM Right 12/27/2022 Left 12/27/2022 Left 01/10/2023 Left 01/17/2023 Left 01/28/2023 Left 02/07/2023 Measured in supine  Shoulder flexion 167/NT NT/92s 120 AROM in supine 135 AROM in supine, AAROM with stick  139 145 AROM in supine 145 AROM  Shoulder abduction 170/NT NT/60s  90 AROM in supine 109 AROM in supine 125 AROM   Shoulder internal rotation     64 AROM in 45 deg abduction supine 70 AROM in 45 deg abduction supine  Shoulder external rotation  NT/18 deg  s   47 AROM in 45 deg abduction supine 58 AROM in 45 deg abduction supine  Elbow flexion  Full elbow ROM painless but stiff sensation      Elbow extension        Wrist flexion        Wrist extension         (Blank rows = not tested) (Key: WFL = within functional limits not formally assessed, * = concordant pain, s = stiffness/stretching sensation, NT = not tested)  EVAL: Comments: AROM NT given recency of surgery, MD note recommending AAROM/PROM at present  UPPER EXTREMITY MMT:  MMT Right 01/21/2023 Left 01/21/2023 Left 02/07/2023 Left 02/15/2023 Left 02/28/2023  Shoulder flexion  3+/5 5/5  5/5  Shoulder extension       Shoulder abduction   4/5  5/5  Shoulder extension       Shoulder internal rotation   5/5  5/5  Shoulder external rotation   4/5  5/5         Prone middle trap    3+/5 4+/5  Prone lower trap    4/5 4+/5   (Blank rows = not tested)  EVAL: Comments: NT on eval given recency of surgery  SHOULDER SPECIAL TESTS: 12/27/2022 NT given recency of surgery  JOINT MOBILITY TESTING:  12/27/2022 NT  PALPATION:  12/27/2022 No overt TTP throughout shoulder girdle, mild muscular tightness UT/LS but asympomatic  OBSERVATION:  12/27/2022  Visually inspected surgical site w/ pt verbal consent. Appears WNL, healing well, no erythema or significant swelling. No drainage or excessive warmth   TODAY'S TREATMENT:                                                                                              DATE: 02/28/2023 Therapeutic Exercise:  UBE fwd/back 4 mins each way lvl 4.0  Prone Lt horizontal abduction 2 x 15 2 lb   Prone Lt scaption 2 x 15 1 lb   Prone Lt rows 10 lbs 2 x 10      Standing shoulder abduction to 100 deg c  anterior red band resistance 3 x 10   Standing green band Lt shoulder ER c flexion punch 2 x 15  TODAY'S TREATMENT:                                                                                              DATE: 02/21/2023 Therapeutic Exercise:  UBE fwd/back 4 mins each way 4 mins   Prone horizontal abduction 2 x 10 2 lb   Prone scaption 2 x 10 1 lb   Standing green band ER c flexion punch Lt arm x 15, x 10 c flexion punch and trunk rotation to face origin of band  quadruped  plank on knees hold SA press bilateral 5 sec hold x 10  Counter push up positioning alt shoulder touching x 20 each, push up plus hold 2 x 10   Standing shoulder abduction to 100 deg c anterior red band resistance 2 x 10   TODAY'S TREATMENT:                                                                                              DATE: 02/18/2023 Therapeutic Exercise:  UBE fwd/back 4 mins each way c 10 sec intervals faster end of each minute lvl 3.0  Prone horizontal abduction 2 x 15 1 lb   Prone scaption 2 x 15 1 lb   Sidelying Lt shoulder ER 2 lb reactive ball catch 30 sec x 5  Standing green band ER c flexion punch Lt arm 3 x 10   quadruped on knees push up hold SA press bilateral 5 sec hold x 10  Quadruped on knees alternating arm reactive light touching 20 seconds x 4  Sitting Lt arm flexion 90 deg ball reactive ball drop catches x 20, x 30 2 lb ball  Lat pull down 2 x 15 25 lbs   TODAY'S TREATMENT:                                                                                              DATE: 02/15/2023 Therapeutic Exercise:  UBE fwd/back 4 mins each way c 10 sec intervals faster end of each minute lvl 4.0  Prone horizontal abduction 2 x 10 1 lb   Prone scaption 2 x 10  Prone shoulder extension 2 x 15 2 lb  Sidelying Lt shoulder ER 2 lb reactive ball catch 30 sec x 4  Standing green band ER c flexion punch Lt arm 3 x 10   Chest press machine serratus Lt arm only 10 lbs 5 sec hold 2 x 10   Lat pull  down 2 x 15 25 lbs  Manual  Supine Lt shoulder posterior glide mobilization c movement with passive  ER, then IR  Inferior glides in abduction end range g3-g4 Lt shoulder   PATIENT EDUCATION: 01/14/2023 Education details: HEP update Person educated: Patient Education method: Consulting civil engineer, Demonstration, Verbal cues, and Handouts Education comprehension: verbalized understanding, returned demonstration  HOME EXERCISE PROGRAM: Access Code: [facility] URL: [email or web address] Date: 02/15/2023 Prepared by: Scot Jun  Exercises - Sleeper Stretch  - 2 x daily - 7 x weekly - 1 sets - 5 reps - 30 hold - Single Arm Doorway Pec Stretch at 90 Degrees Abduction (Mirrored)  - 2-3 x daily - 7 x weekly - 1 sets - 5 reps - 30 hold - Shoulder External Rotation with Anchored Resistance  - 2 x daily -  7 x weekly - 3 sets - 10 reps - Shoulder Extension with Resistance  - 1-2 x daily - 7 x weekly - 1-2 sets - 10-15 reps - Standing Bilateral Low Shoulder Row with Anchored Resistance  - 1-2 x daily - 7 x weekly - 2-3 sets - 10-15 reps - Prone Single Arm Shoulder Y (Mirrored)  - 1 x daily - 7 x weekly - 3 sets - 10-15 reps - Prone Shoulder Horizontal Abduction (Mirrored)  - 1 x daily - 7 x weekly - 3 sets - 10 reps - Standing Shoulder Flexion to 90 Degrees with Dumbbells  - 1 x daily - 7 x weekly - 3 sets - 10 reps - Shoulder Abduction with Dumbbells - Thumbs Up  - 1 x daily - 7 x weekly - 3 sets - 10 reps  ASSESSMENT:  CLINICAL IMPRESSION: Continued improved strength noted in objective testing today.  Anticipate discharge next visit to HEP for continued progression at home.     Pt has attended 17 visits with gains as noted in objective data.  Continued skilled PT warranted to continue to improve confidence and   OBJECTIVE IMPAIRMENTS: decreased activity tolerance, decreased mobility, decreased ROM, decreased strength, hypomobility, increased edema, impaired flexibility, impaired UE  functional use, postural dysfunction, and pain.   ACTIVITY LIMITATIONS: carrying, lifting, sitting, sleeping, bathing, dressing, reach over head, and hygiene/grooming  PARTICIPATION LIMITATIONS: meal prep, cleaning, laundry, driving, community activity, and occupation  PERSONAL FACTORS: 3+ comorbidities: HTN, pulmonary nodules, hx palpitations  are also affecting patient's functional outcome.   REHAB POTENTIAL: Good  CLINICAL DECISION MAKING: Stable/uncomplicated  EVALUATION COMPLEXITY: Low   GOALS: Goals reviewed with patient? No  SHORT TERM GOALS: Target date: 01/31/2023    Pt will demonstrate appropriate understanding and performance of initially prescribed HEP in order to facilitate improved independence with management of symptoms.  Baseline: HEP provided on eval Goal status: Met 01/23/2023  2. Pt will score greater than or equal to 51% on FOTO in order to demonstrate improved perception of function due to symptoms.  Baseline: 38%  Goal status: Met 01/28/2023  LONG TERM GOALS: Target date: 03/27/2023  Pt will score 66% on FOTO in order to demonstrate improved perception of function due to symptoms. Baseline: 38% Goal status: on going 02/28/2023  2.  Pt will demonstrate at least 150 degrees of L active shoulder elevation in order to demonstrate improved tolerance to functional movement patterns such as upper body dressing and reaching overhead.  Baseline: see ROM chart above - AROM NT on eval given recency of surgery Goal status: on going 02/28/2023  3.  Pt will demonstrate at least 4+/5 shoulder flexion/abduction MMT for improved symmetry of UE strength and improved tolerance to functional movements.  Baseline: see MMT chart above - deferred on eval given recency of surgery Goal status: Met 02/28/2023  4. Pt will report/demonstrate ability to lift up to 10 lbs with less than 2 point increase in pain on NPS in order to indicative improved tolerance/independence with heavier  activities around the home.   Baseline: NT given recency of surgery  Goal status: on going 02/28/2023  PLAN:  PT FREQUENCY: 1x  PT DURATION: 1 week  PLANNED INTERVENTIONS: Therapeutic exercises, Therapeutic activity, Neuromuscular re-education, Balance training, Gait training, Patient/Family education, Self Care, Joint mobilization, Dry Needling, Electrical stimulation, Spinal mobilization, Cryotherapy, Moist heat, scar mobilization, Taping, Vasopneumatic device, Manual therapy, and Re-evaluation; above unless contraindicated  PLAN FOR NEXT SESSION: Anticipate discharge next visit pending symptoms.  Marya Landry, PT, DPT, OCS, ATC 02/28/23  4:23 PM

## 2023-03-04 ENCOUNTER — Ambulatory Visit (INDEPENDENT_AMBULATORY_CARE_PROVIDER_SITE_OTHER): Payer: BC Managed Care – PPO | Admitting: Rehabilitative and Restorative Service Providers"

## 2023-03-04 ENCOUNTER — Encounter: Payer: Self-pay | Admitting: Rehabilitative and Restorative Service Providers"

## 2023-03-04 DIAGNOSIS — M25512 Pain in left shoulder: Secondary | ICD-10-CM | POA: Diagnosis not present

## 2023-03-04 DIAGNOSIS — M25612 Stiffness of left shoulder, not elsewhere classified: Secondary | ICD-10-CM

## 2023-03-04 DIAGNOSIS — M6281 Muscle weakness (generalized): Secondary | ICD-10-CM

## 2023-03-04 DIAGNOSIS — R293 Abnormal posture: Secondary | ICD-10-CM | POA: Diagnosis not present

## 2023-03-04 NOTE — Therapy (Signed)
OUTPATIENT PHYSICAL THERAPY TREATMENT /RECERT   Patient Name: Todd Bailey MRN: [id number] DOB:04-02-1961, 62 y.o., male Today's Date: 03/04/2023  PHYSICAL THERAPY DISCHARGE SUMMARY  Visits from Start of Care: 18  Current functional level related to goals / functional outcomes: See note   Remaining deficits: See note   Education / Equipment: HEP  Patient goals were met. Patient is being discharged due to being pleased with the current functional level.    END OF SESSION:  PT End of Session - 03/04/23 1546     Visit Number 18    Number of Visits 18    Date for PT Re-Evaluation 03/07/23    Authorization Type [employer]    PT Start Time 1542    PT Stop Time 1606    PT Time Calculation (min) 24 min    Activity Tolerance Patient tolerated treatment well    Behavior During Therapy [facility] for tasks assessed/performed              Past Medical History:  Diagnosis Date   Arthritis    Elevated coronary artery calcium score 07/2022   96th %'tile   GERD (gastroesophageal reflux disease)    Hypercholesterolemia 08/2020   TLC->imp 12/2020   Hypertension    Obesity, Class I, BMI 30-34.9    Palpitation 04/2020   Zio patch x 3d -->occ PAC and PVC; triggered events correlate with times of PVCs.  Reassured.   Pulmonary nodules    on coronary ca score CT 08/28/22-->f/u CT 3-6 mo (Dr. Einar Gip)   Past Surgical History:  Procedure Laterality Date   BICEPT TENODESIS Left 12/06/2022   Procedure: BICEPS TENODESIS;  Surgeon: Meredith Pel, MD;  Location: [place];  Service: Orthopedics;  Laterality: Left;   CHOLECYSTECTOMY Right 09/14/2021   Procedure: LAPAROSCOPIC CHOLECYSTECTOMY WITH INTRAOPERATIVE CHOLANGIOGRAM,;  Surgeon: Greer Pickerel, MD;  Location: [facility];  Service: General;  Laterality: Right;   COLONOSCOPY  approx 2018   ? polyp, unclear on recall time--get records.   coron calcium score     08/27/22: 96th%'tile (Dr. Einar Gip)   ESOPHAGEAL DILATION  2001   Dr. Amedeo Plenty.  Had it  done twice (GERD-induced stricture)   Heart Rhythm monitoring  04/2020   Zio patch x 3d -->occ PAC and PVC; triggered events correlate with times of PVCs.  Reassured.   LEFT HEART CATH AND CORONARY ANGIOGRAPHY N/A 10/23/2022   Procedure: LEFT HEART CATH AND CORONARY ANGIOGRAPHY;  Surgeon: Adrian Prows, MD;  Location: [place] [facility];  Service: Cardiovascular;  Laterality: N/A;   SHOULDER ARTHROSCOPY Left 12/06/2022   Procedure: left shoulder arthroscopy, debridement, bursectomy;  Surgeon: Meredith Pel, MD;  Location: [place];  Service: Orthopedics;  Laterality: Left;   TRANSTHORACIC ECHOCARDIOGRAM  09/28/2022   normal   Patient Active Problem List   Diagnosis Date Noted   Biceps tendinitis of left shoulder 12/08/2022   Degenerative superior labral anterior-to-posterior (SLAP) tear of left shoulder 12/08/2022   Coronary artery disease of native artery of native heart with stable angina pectoris ([place])    Cholelithiasis 09/12/2021   Cholecystitis 09/12/2021   H/O adenomatous polyp of colon 04/05/2016   De Quervain's tenosynovitis, right 12/27/2014   Arthritis 12/06/2014   Essential hypertension 12/06/2014   GERD (gastroesophageal reflux disease) 12/06/2014   Hyperlipemia 12/06/2014   Prediabetes 12/06/2014    PCP: Tammi Sou, MD  REFERRING PROVIDER: Meredith Pel, MD  REFERRING DIAG: [phone] (ICD-10-CM) - Biceps tendinosis of left shoulder M25.512 (ICD-10-CM) - Left shoulder pain,  unspecified chronicity M65.812 (ICD-10-CM) - Synovitis of left shoulder  THERAPY DIAG:  Left shoulder pain, unspecified chronicity  Stiffness of left shoulder, not elsewhere classified  Muscle weakness (generalized)  Abnormal posture  Rationale for Evaluation and Treatment: Rehabilitation  ONSET DATE: 12/06/22 Lt shoulder arthroscopy and biceps tenodesis  SUBJECTIVE:                                                                                                                                                                                       SUBJECTIVE STATEMENT: No specific complaints of symptoms reported today.  No specific daily things being avoided at this time other than the lifting restriction from MD office.     PERTINENT HISTORY: GERD, HTN, hx palpitations, pulmonary nodules  PAIN:   Location: Lt shoulder How would you describe your pain? sore Best in past week: 0/10 Aggravating factors: movements, Easing factors: ice, tylenol   PRECAUTIONS: s/p Lt shoulder arthroscopic debridement with biceps tenodesis "Passive range of motion active assisted range of motion of the left shoulder with no biceps resistance exercises." Per 12/14/22 MD note  01/07/2023 MD note update:   No lifting with that left arm for the next 2 weeks. Weeks 2-4 from now he is okay to do lifting of 5 pounds. Weeks 4-6 from now he is okay to lift 10 pounds with the left arm.   WEIGHT BEARING RESTRICTIONS: No  FALLS:  Has patient fallen in last 6 months? No   LIVING ENVIRONMENT: 2 story home, main level livable. 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, desk work and up on feet  PLOF: Independent, get back to playing hockey  PATIENT GOALS: reduce pain, return to PLOF - get back to playing hockey  NEXT MD VISIT:   OBJECTIVE:   DIAGNOSTIC FINDINGS:  12/27/2022 12/06/22 shoulder arthroscopy with biceps tenodesis  PATIENT SURVEYS:  03/04/2023:  FOTO update 72  02/21/2023:  FOTO update 63  01/28/2023:  FOTO update 55  12/27/2022 FOTO 38%, 66 predicted  COGNITION: 12/27/2022 Overall cognitive status: Within functional limits for tasks assessed     SENSATION: 12/27/2022 Light touch grossly intact bilateral UE, pt denies any sensory issues  POSTURE: 12/27/2022 Slightly guarded posture Lt UE, tendency to hold in [facility] IR, forward head  UPPER EXTREMITY  ROM:  A/PROM Right 12/27/2022 Left 12/27/2022 Left 01/10/2023 Left 01/17/2023 Left 01/28/2023 Left 02/07/2023 Measured in supine  Shoulder flexion 167/NT NT/92s 120 AROM in supine 135 AROM in supine, AAROM with stick 139 145 AROM in supine 145 AROM  Shoulder abduction 170/NT NT/60s  90 AROM in supine 109 AROM in supine 125 AROM   Shoulder internal rotation     64 AROM in 45 deg abduction supine 70 AROM in 45 deg abduction supine  Shoulder external rotation  NT/18 deg s   47 AROM in 45 deg abduction supine 58 AROM in 45 deg abduction supine  Elbow flexion  Full elbow ROM painless but stiff sensation      Elbow extension        Wrist flexion        Wrist extension         (Blank rows = not tested) (Key: WFL = within functional limits not formally assessed, * = concordant pain, s = stiffness/stretching sensation, NT = not tested)  EVAL: Comments: AROM NT given recency of surgery, MD note recommending AAROM/PROM at present  UPPER EXTREMITY MMT:  MMT Right 01/21/2023 Left 01/21/2023 Left 02/07/2023 Left 02/15/2023 Left 02/28/2023  Shoulder flexion  3+/5 5/5  5/5  Shoulder extension       Shoulder abduction   4/5  5/5  Shoulder extension       Shoulder internal rotation   5/5  5/5  Shoulder external rotation   4/5  5/5         Prone middle trap    3+/5 4+/5  Prone lower trap    4/5 4+/5   (Blank rows = not tested)  EVAL: Comments: NT on eval given recency of surgery  SHOULDER SPECIAL TESTS: 12/27/2022 NT given recency of surgery  JOINT MOBILITY TESTING:  12/27/2022 NT  PALPATION:  12/27/2022 No overt TTP throughout shoulder girdle, mild muscular tightness UT/LS but asympomatic  OBSERVATION:  12/27/2022  Visually inspected surgical site w/ pt verbal consent. Appears WNL, healing well, no erythema or significant swelling. No drainage or excessive warmth   TODAY'S TREATMENT:                                                                                              DATE:  03/04/2023 Therapeutic Exercise:  UBE fwd/back 4 mins each way lvl 4.0   Prone Lt shoulder extension 3 lbs 2 x 15  Prone Lt horizontal abduction 2 x 15 3 lb   Prone Lt scaption 2 x 15 2 lb   Prone Lt rows 5lbs 2 x 15    TODAY'S TREATMENT:                                                                                              DATE: 02/28/2023 Therapeutic Exercise:  UBE fwd/back 4 mins each way lvl 4.0  Prone Lt horizontal abduction 2 x 15 2 lb   Prone Lt scaption 2 x 15 1 lb   Prone Lt rows 10 lbs 2  x 10      Standing shoulder abduction to 100 deg c anterior red band resistance 3 x 10   Standing green band Lt shoulder ER c flexion punch 2 x 15  TODAY'S TREATMENT:                                                                                              DATE: 02/21/2023 Therapeutic Exercise:  UBE fwd/back 4 mins each way 4 mins   Prone horizontal abduction 2 x 10 2 lb   Prone scaption 2 x 10 1 lb   Standing green band ER c flexion punch Lt arm x 15, x 10 c flexion punch and trunk rotation to face origin of band  quadruped plank on knees hold SA press bilateral 5 sec hold x 10  Counter push up positioning alt shoulder touching x 20 each, push up plus hold 2 x 10   Standing shoulder abduction to 100 deg c anterior red band resistance 2 x 10   TODAY'S TREATMENT:                                                                                              DATE: 02/18/2023 Therapeutic Exercise:  UBE fwd/back 4 mins each way c 10 sec intervals faster end of each minute lvl 3.0  Prone horizontal abduction 2 x 15 1 lb   Prone scaption 2 x 15 1 lb   Sidelying Lt shoulder ER 2 lb reactive ball catch 30 sec x 5  Standing green band ER c flexion punch Lt arm 3 x 10   quadruped on knees push up hold SA press bilateral 5 sec hold x 10  Quadruped on knees alternating arm reactive light touching 20 seconds x 4  Sitting Lt arm flexion 90 deg ball reactive ball drop catches x 20, x 30 2 lb  ball  Lat pull down 2 x 15 25 lbs   TODAY'S TREATMENT:                                                                                              DATE: 02/15/2023 Therapeutic Exercise:  UBE fwd/back 4 mins each way c 10 sec intervals faster end of each minute lvl 4.0  Prone horizontal abduction 2 x 10 1 lb   Prone scaption 2 x 10  Prone shoulder extension 2  x 15 2 lb  Sidelying Lt shoulder ER 2 lb reactive ball catch 30 sec x 4  Standing green band ER c flexion punch Lt arm 3 x 10   Chest press machine serratus Lt arm only 10 lbs 5 sec hold 2 x 10   Lat pull down 2 x 15 25 lbs  Manual  Supine Lt shoulder posterior glide mobilization c movement with passive  ER, then IR  Inferior glides in abduction end range g3-g4 Lt shoulder   PATIENT EDUCATION: 03/04/2023 Education details: HEP update Person educated: Patient Education method: Consulting civil engineer, Demonstration, Verbal cues, and Handouts Education comprehension: verbalized understanding, returned demonstration  HOME EXERCISE PROGRAM: Access Code: [facility] URL: [email or web address] Date: 03/04/2023 Prepared by: Scot Jun  Exercises - Sleeper Stretch  - 2 x daily - 7 x weekly - 1 sets - 5 reps - 30 hold - Single Arm Doorway Pec Stretch at 90 Degrees Abduction (Mirrored)  - 2-3 x daily - 7 x weekly - 1 sets - 5 reps - 30 hold - Shoulder External Rotation with Anchored Resistance  - 1 x daily - 7 x weekly - 3 sets - 10 reps - Shoulder Extension with Resistance  - 1 x daily - 7 x weekly - 1-2 sets - 10-15 reps - Standing Bilateral Low Shoulder Row with Anchored Resistance  - 1 x daily - 7 x weekly - 2-3 sets - 10-15 reps - Prone Single Arm Shoulder Y (Mirrored)  - 1 x daily - 7 x weekly - 3 sets - 10-15 reps - Prone Shoulder Horizontal Abduction (Mirrored)  - 1 x daily - 7 x weekly - 3 sets - 10 reps - Standing Shoulder Flexion to 90 Degrees with Dumbbells  - 1 x daily - 7 x weekly - 3 sets - 10 reps - Shoulder  Abduction with Dumbbells - Thumbs Up  - 1 x daily - 7 x weekly - 3 sets - 10 reps - Prone Shoulder Row in Abduction  - 1 x daily - 7 x weekly - 2-3 sets - 10-15 reps  ASSESSMENT:  CLINICAL IMPRESSION: Reviewed HEP program with good knowledge.  FOTO reached and passed goal.  Recommend discharge from skilled PT services to HEP at this time. Pt in agreement.   OBJECTIVE IMPAIRMENTS: decreased activity tolerance, decreased mobility, decreased ROM, decreased strength, hypomobility, increased edema, impaired flexibility, impaired UE functional use, postural dysfunction, and pain.   ACTIVITY LIMITATIONS: carrying, lifting, sitting, sleeping, bathing, dressing, reach over head, and hygiene/grooming  PARTICIPATION LIMITATIONS: meal prep, cleaning, laundry, driving, community activity, and occupation  PERSONAL FACTORS: 3+ comorbidities: HTN, pulmonary nodules, hx palpitations  are also affecting patient's functional outcome.   REHAB POTENTIAL: Good  CLINICAL DECISION MAKING: Stable/uncomplicated  EVALUATION COMPLEXITY: Low   GOALS: Goals reviewed with patient? No  SHORT TERM GOALS: Target date: 01/31/2023    Pt will demonstrate appropriate understanding and performance of initially prescribed HEP in order to facilitate improved independence with management of symptoms.  Baseline: HEP provided on eval Goal status: Met 01/23/2023  2. Pt will score greater than or equal to 51% on FOTO in order to demonstrate improved perception of function due to symptoms.  Baseline: 38%  Goal status: Met 01/28/2023  LONG TERM GOALS: Target date: 03/27/2023  Pt will score 66% on FOTO in order to demonstrate improved perception of function due to symptoms.  Goal status: met 03/04/2023  2.  Pt will demonstrate at least 150 degrees of LT active shoulder elevation in order  to demonstrate improved tolerance to functional movement patterns such as upper body dressing and reaching overhead.   Goal status: mostly  met 03/04/2023  3.  Pt will demonstrate at least 4+/5 shoulder flexion/abduction MMT for improved symmetry of UE strength and improved tolerance to functional movements.   Goal status: Met 02/28/2023  4. Pt will report/demonstrate ability to lift up to 10 lbs with less than 2 point increase in pain on NPS in order to indicative improved tolerance/independence with heavier activities around the home.    Goal status: met 03/04/2023  PLAN:  PT FREQUENCY:   PT DURATION:  PLANNED INTERVENTIONS: Therapeutic exercises, Therapeutic activity, Neuromuscular re-education, Balance training, Gait training, Patient/Family education, Self Care, Joint mobilization, Dry Needling, Electrical stimulation, Spinal mobilization, Cryotherapy, Moist heat, scar mobilization, Taping, Vasopneumatic device, Manual therapy, and Re-evaluation; above unless contraindicated  PLAN FOR NEXT SESSION:  Discharge to HEP.    Scot Jun, PT, DPT, OCS, ATC 03/04/23  4:03 PM

## 2023-05-14 ENCOUNTER — Ambulatory Visit: Payer: 59 | Admitting: Cardiology

## 2023-05-14 ENCOUNTER — Other Ambulatory Visit: Payer: Self-pay | Admitting: Family Medicine

## 2023-07-02 ENCOUNTER — Encounter: Payer: Self-pay | Admitting: Cardiology

## 2023-07-02 ENCOUNTER — Ambulatory Visit: Payer: 59 | Admitting: Cardiology

## 2023-07-02 VITALS — BP 142/84 | HR 67 | Resp 16 | Ht 68.0 in | Wt 196.8 lb

## 2023-07-02 DIAGNOSIS — I2510 Atherosclerotic heart disease of native coronary artery without angina pectoris: Secondary | ICD-10-CM | POA: Diagnosis not present

## 2023-07-02 DIAGNOSIS — R931 Abnormal findings on diagnostic imaging of heart and coronary circulation: Secondary | ICD-10-CM

## 2023-07-02 DIAGNOSIS — I10 Essential (primary) hypertension: Secondary | ICD-10-CM | POA: Diagnosis not present

## 2023-07-02 DIAGNOSIS — E7800 Pure hypercholesterolemia, unspecified: Secondary | ICD-10-CM

## 2023-07-02 MED ORDER — ASPIRIN 81 MG PO CHEW: 81.0000 mg | CHEWABLE_TABLET | Freq: Every day | ORAL | Status: AC

## 2023-07-02 NOTE — Progress Notes (Signed)
Primary Physician/Referring:  Jeoffrey Massed, MD  Patient ID: Todd Bailey, male    DOB: [birth year], 62 y.o.   MRN: [id number]  Chief Complaint  Patient presents with   Coronary Artery Disease   Hypertension   Hyperlipidemia   Follow-up    6 months   HPI:    Todd Bailey  is a 62 y.o. Caucasian male patient with chronic palpitations, previous outpatient extended EKG monitoring it revealed PVCs correlating with palpitations and now resolved, hypertension, hypercholesterolemia, esophageal stricture SP dilatation in 2001. Due to markedly elevated coronary calcium score and also abnormal nuclear stress test, underwent coronary angiography on 10/23/2022 revealing mild coronary atherosclerosis.   He is presently asymptomatic.  He has discontinued metoprolol as he has not had any further episodes of palpitations.  He has reduced the dose of losartan with weight loss states that his blood pressure is now very well-controlled.  Past Medical History:  Diagnosis Date   Arthritis    Elevated coronary artery calcium score 07/2022   96th %'tile   GERD (gastroesophageal reflux disease)    Hypercholesterolemia 08/2020   TLC->imp 12/2020   Hypertension    Obesity, Class I, BMI 30-34.9    Palpitation 04/2020   Zio patch x 3d -->occ PAC and PVC; triggered events correlate with times of PVCs.  Reassured.   Pulmonary nodules    on coronary ca score CT 08/28/22-->f/u CT 3-6 mo (Dr. Jacinto Halim)   Past Surgical History:  Procedure Laterality Date   BICEPT TENODESIS Left 12/06/2022   Procedure: BICEPS TENODESIS;  Surgeon: Cammy Copa, MD;  Location: [facility] OR;  Service: Orthopedics;  Laterality: Left;   CHOLECYSTECTOMY Right 09/14/2021   Procedure: LAPAROSCOPIC CHOLECYSTECTOMY WITH INTRAOPERATIVE CHOLANGIOGRAM,;  Surgeon: [name], MD;  Location: [facility] ORS;  Service: General;  Laterality: Right;   COLONOSCOPY  approx 2018   ? polyp, unclear on recall time--get records.   coron calcium  score     08/27/22: 96th%'tile (Dr. Jacinto Halim)   ESOPHAGEAL DILATION  2001   Dr. Madilyn Fireman.  Had it done twice (GERD-induced stricture)   Heart Rhythm monitoring  04/2020   Zio patch x 3d -->occ PAC and PVC; triggered events correlate with times of PVCs.  Reassured.   LEFT HEART CATH AND CORONARY ANGIOGRAPHY N/A 10/23/2022   Procedure: LEFT HEART CATH AND CORONARY ANGIOGRAPHY;  Surgeon: Yates Decamp, MD;  Location: [facility];  Service: Cardiovascular;  Laterality: N/A;   SHOULDER ARTHROSCOPY Left 12/06/2022   Procedure: left shoulder arthroscopy, debridement, bursectomy;  Surgeon: Cammy Copa, MD;  Location: [facility] OR;  Service: Orthopedics;  Laterality: Left;   TRANSTHORACIC ECHOCARDIOGRAM  09/28/2022   normal   Family History  Problem Relation Age of Onset   Arthritis Mother    Hearing loss Mother    Heart disease Mother    Arthritis Father    Heart disease Father    High Cholesterol Father    High blood pressure Father     Social History   Tobacco Use   Smoking status: Former    Packs/day: 1.00    Years: 15.00    Additional pack years: 0.00    Total pack years: 15.00    Types: Cigarettes    Quit date: 2001    Years since quitting: 23.5   Smokeless tobacco: Never  Substance Use Topics   Alcohol use: Never   Marital Status: Married  ROS  Review of Systems  Cardiovascular:  Negative for chest pain, dyspnea on  exertion and leg swelling.   Objective      07/02/2023    4:11 PM 12/06/2022    6:50 PM 12/06/2022    6:35 PM  Vitals with BMI  Height 5\' 8"     Weight 196 lbs 13 oz    BMI 29.93    Systolic 142 141 811  Diastolic 84 95 90  Pulse 67 79 80   Today's Vitals   07/02/23 1611  BP: (!) 142/84  Pulse: 67  Resp: 16  SpO2: 97%  Weight: 196 lb 12.8 oz (89.3 kg)  Height: 5\' 8"  (1.727 m)    Body mass index is 29.92 kg/m.   Physical Exam Neck:     Vascular: No carotid bruit or JVD.  Cardiovascular:     Rate and Rhythm: Normal rate and regular rhythm.      Pulses: Intact distal pulses.     Heart sounds: Normal heart sounds. No murmur heard.    No gallop.  Pulmonary:     Effort: Pulmonary effort is normal.     Breath sounds: Normal breath sounds.  Abdominal:     General: Bowel sounds are normal.     Palpations: Abdomen is soft.  Musculoskeletal:     Right lower leg: No edema.     Left lower leg: No edema.    Medications and allergies  No Known Allergies   Medication list after today's encounter   Current Outpatient Medications:    acetaminophen (TYLENOL) 325 MG tablet, Take 650 mg by mouth every 6 (six) hours as needed for mild pain or headache., Disp: , Rfl:    aspirin (ASPIRIN CHILDRENS) 81 MG chewable tablet, Chew 1 tablet (81 mg total) by mouth daily., Disp: , Rfl:    atorvastatin (LIPITOR) 20 MG tablet, Take 1 tablet (20 mg total) by mouth daily. (Patient taking differently: Take 20 mg by mouth daily with lunch.), Disp: 90 tablet, Rfl: 3   losartan (COZAAR) 100 MG tablet, Take 1 tablet (100 mg total) by mouth at bedtime. (Patient taking differently: Take 50 mg by mouth at bedtime.), Disp: 90 tablet, Rfl: 3   nitroGLYCERIN (NITROSTAT) 0.4 MG SL tablet, Place 1 tablet (0.4 mg total) under the tongue every 5 (five) minutes as needed for up to 25 days for chest pain., Disp: 25 tablet, Rfl: 3   omeprazole (PRILOSEC) 20 MG capsule, Take 1 capsule (20 mg total) by mouth daily. (Patient taking differently: Take 20 mg by mouth daily before breakfast.), Disp: 90 capsule, Rfl: 1  Laboratory examination:   Last metabolic panel Lab Results  Component Value Date   GLUCOSE 120 (H) 11/30/2022   NA 143 11/30/2022   K 3.8 11/30/2022   CL 105 11/30/2022   CO2 29 11/30/2022   BUN 11 11/30/2022   CREATININE 1.06 11/30/2022   EGFR 92 10/18/2022   CALCIUM 9.9 11/30/2022   PROT 6.5 10/23/2021   ALBUMIN 4.2 10/23/2021   BILITOT 0.6 10/23/2021   ALKPHOS 90 10/23/2021   AST 20 10/23/2021   ALT 30 10/23/2021   ANIONGAP 9 11/30/2022   Lab  Results  Component Value Date   CHOL 154 10/23/2021   HDL 34.30 (L) 10/23/2021   LDLCALC 98 10/23/2021   TRIG 112.0 10/23/2021   CHOLHDL 4 10/23/2021    Apolipo. B/A-1 Ratio 10/23/2022 0.0 - 0.7 ratio 0.8 High    Lipoprotein (a) 10/23/2022 <75.0 nmol/L <8.4   Apolipo. B/A-1 Ratio 10/23/2022 0.0 - 0.7 ratio 0.8 High    HEMOGLOBIN A1C No  results found for: "HGBA1C", "MPG" TSH Recent Labs    07/25/22 1609  TSH 2.17   Radiology:    Cardiac Studies:   Outpatient Zio patch 2 weeks for 07/19/2020: Predominant rhythm is normal sinus rhythm.  Occasional isolated PVCs and PACs.  Triggered events correlated with PVCs.  Coronary calcium score 08/27/2022:  Total Agatston Score: 1147. MESA database percentile: 96 M164 LAD 541 LCx 212 RCA 230. Ascending and descending aortic measurements are normal. Multiple pulmonary nodules, most severe 8 mm in the right lower lobe and posterior left lower lobe..  If patient high risk for malignancy.  Recommend additional noncontrast chest CT.  PCV MYOCARDIAL PERFUSION WO LEXISCAN 09/24/2022  Narrative Exercise Sestamibi stress test 09/24/2022: Exercise nuclear stress test was performed using Bruce protocol. Patient reached 8.8 METS, and 87% of age predicted maximum heart rate. Exercise capacity was good. No chest pain reported. Normal heart rate. Resting hypertension 160/90 mmHg, with normal blood pressure response. Stress EKG revealed no ischemic changes. Normal myocardial perfusion. Normal wall motion and myocardial thickening. Stress LVEF calculated 41%, but visually appears normal. Intermediate risk stud due to reduced LVEF.  PCV ECHOCARDIOGRAM COMPLETE 09/28/2022  Normal LV systolic function with visual EF 60-65%. Left ventricle cavity is normal in size. Normal left ventricular wall thickness. Normal global wall motion. Normal diastolic filling pattern, normal LAP. No significant valvular abnormalities. No prior study for comparison.  Left  Heart Catheterization 10/23/22:  LV 142/-1, EDP 11 mmHg.  Aorta 136/69, mean 95 mmHg.  There is no pressure clinical 75.  LVEF 55 to 60% with no wall motion abnormality.  No significant mitral regurgitation. Left Main Ost LM to Mid LAD lesion is 5% stenosed. The lesion is irregular. The lesion is mildly calcified. Ramus Intermedius Ramus lesion is 30% stenosed. Left Anterior Descending mild diffuse disease, mild to moderate diffuse coronary calcification noted all the way from the ostium to the mid segment.  There are 2 moderate-sized D1 and D2 which have calcification, D1 has a focal 60% stenosis. Left Circumflex Prox Cx to Mid Cx diffuse lesion is 5% stenosed. The lesion is mildly calcified.  Right Coronary Artery Prox and distal RCA lesion is 20% stenosed. The lesion is mildly calcified. Mild diffuse calcification noted throughout.       EKG:   EKG 07/02/2023: Normal sinus rhythm at the rate of 63 beats found, incomplete right bundle branch block.  Otherwise normal EKG.  No significant change from 08/16/2022.  Assessment     ICD-10-CM   1. Coronary artery disease due to calcified coronary lesion  I25.10 EKG 12-Lead   I25.84 Lipid Panel With LDL/HDL Ratio    aspirin (ASPIRIN CHILDRENS) 81 MG chewable tablet    2. Elevated coronary artery calcium score 08/27/2022: Total Agatston Score: 1147. MESA database percentile: 96  R93.1     3. Primary hypertension  I10     4. Pure hypercholesterolemia  E78.00 Lipid Panel With LDL/HDL Ratio       Orders Placed This Encounter  Procedures   Lipid Panel With LDL/HDL Ratio   EKG 12-Lead   Meds ordered this encounter  Medications   aspirin (ASPIRIN CHILDRENS) 81 MG chewable tablet    Sig: Chew 1 tablet (81 mg total) by mouth daily.    Medications Discontinued During This Encounter  Medication Reason   aspirin 81 MG chewable tablet    Cholecalciferol (VITAMIN D3) 125 MCG (5000 UT) TABS    HAWTHORN BERRY PO    methocarbamol (ROBAXIN) 500 MG  tablet    metoprolol succinate (TOPROL-XL) 50 MG 24 hr tablet    Omega-3 Fatty Acids (FISH OIL PO)    oxyCODONE-acetaminophen (PERCOCET) 5-325 MG tablet       Recommendations:   Todd Bailey is a 62 y.o.  Caucasian male patient with chronic palpitations, previous outpatient extended EKG monitoring it revealed PVCs correlating with palpitations and now resolved, hypertension, hypercholesterolemia, esophageal stricture SP dilatation in 2001. Due to markedly elevated coronary calcium score and also abnormal nuclear stress test, underwent coronary angiography on 10/23/2022 revealing mild coronary atherosclerosis.    1. Coronary artery disease due to calcified coronary lesion She has moderate coronary disease especially diagonal branch of the LAD, this can be treated medically.  Continued secondary prevention is indicated.  Aggressive lifestyle modification and control of hypertension and hyperlipidemia would be the most appropriate thing to do, patient has started to exercise regularly and has lost weight and essentially is asymptomatic.  - EKG 12-Lead - Lipid Panel With LDL/HDL Ratio - aspirin (ASPIRIN CHILDRENS) 81 MG chewable tablet; Chew 1 tablet (81 mg total) by mouth daily.  2. Elevated coronary artery calcium score 08/27/2022: Total Agatston Score: 1147. MESA database percentile: 65 Patient is at high risk in view of markedly elevated coronary calcium score.  Advised him to restart aspirin 81 mg daily.  3. Primary hypertension Blood pressure is well-controlled on losartan, continue the same.  He is presently on 50 mg of losartan.  Advised him not to come off of the antihypertensive medication even if his blood pressure might improve with weight loss as coronary artery disease is significant.  4. Pure hypercholesterolemia LDL goal is <70 in view of patient being very young.  LDL is not at goal.  However he has made lifestyle changes and has been exercising regularly and has lost  weight.  Will recheck baseline LDL and depending upon this I will make further recommendation.  Otherwise he is stable from cardiac standpoint and in view of patient being completely asymptomatic, I will see him back on a as needed basis.  - Lipid Panel With LDL/HDL Ratio    Yates Decamp, MD, [facility] 07/02/2023, 8:45 PM Office: [phone]

## 2023-07-12 LAB — LIPID PANEL WITH LDL/HDL RATIO
Cholesterol, Total: 195 mg/dL (ref 100–199)
HDL: 40 mg/dL (ref >39)
LDL Chol Calc (NIH): 143 mg/dL — ABNORMAL HIGH (ref 0–99)
LDL/HDL Ratio: 3.6 ratio (ref 0.0–3.6)
Triglycerides: 63 mg/dL (ref 0–149)
VLDL Cholesterol Cal: 12 mg/dL (ref 5–40)

## 2023-07-13 NOTE — Progress Notes (Signed)
He was previously on Atorva 20 daily and not sure if he is still on it. If still on it, would probably try crestor 20 and add zetia if still not <70 with regards to LDL. I am happy to see him if you want. I had made him PRN as he is very stable from CB standpoint

## 2024-11-04 DIAGNOSIS — Z1384 Encounter for screening for dental disorders: Secondary | ICD-10-CM | POA: Diagnosis not present

## 2024-11-10 DIAGNOSIS — Z0120 Encounter for dental examination and cleaning without abnormal findings: Secondary | ICD-10-CM | POA: Diagnosis not present

## 2024-11-23 DIAGNOSIS — Z0000 Encounter for general adult medical examination without abnormal findings: Secondary | ICD-10-CM | POA: Diagnosis not present

## 2024-11-23 DIAGNOSIS — Z125 Encounter for screening for malignant neoplasm of prostate: Secondary | ICD-10-CM | POA: Diagnosis not present

## 2024-11-23 DIAGNOSIS — Z136 Encounter for screening for cardiovascular disorders: Secondary | ICD-10-CM | POA: Diagnosis not present

## 2024-11-23 DIAGNOSIS — E663 Overweight: Secondary | ICD-10-CM | POA: Diagnosis not present

## 2024-11-23 DIAGNOSIS — K219 Gastro-esophageal reflux disease without esophagitis: Secondary | ICD-10-CM | POA: Diagnosis not present

## 2024-11-23 DIAGNOSIS — Z13220 Encounter for screening for lipoid disorders: Secondary | ICD-10-CM | POA: Diagnosis not present

## 2024-11-23 DIAGNOSIS — Z1211 Encounter for screening for malignant neoplasm of colon: Secondary | ICD-10-CM | POA: Diagnosis not present

## 2024-11-23 DIAGNOSIS — Z1331 Encounter for screening for depression: Secondary | ICD-10-CM | POA: Diagnosis not present

## 2024-11-23 DIAGNOSIS — I10 Essential (primary) hypertension: Secondary | ICD-10-CM | POA: Diagnosis not present
